# Patient Record
Sex: Male | Born: 1955
Health system: Southern US, Community
[De-identification: ages and names within clinical notes are randomized; demographics above are authoritative.]

## PROBLEM LIST (undated history)

## (undated) DIAGNOSIS — Z8709 Personal history of other diseases of the respiratory system: Secondary | ICD-10-CM

## (undated) DIAGNOSIS — G709 Myoneural disorder, unspecified: Secondary | ICD-10-CM

## (undated) DIAGNOSIS — M1990 Unspecified osteoarthritis, unspecified site: Secondary | ICD-10-CM

## (undated) DIAGNOSIS — E1165 Type 2 diabetes mellitus with hyperglycemia: Secondary | ICD-10-CM

## (undated) DIAGNOSIS — Z789 Other specified health status: Secondary | ICD-10-CM

## (undated) DIAGNOSIS — E1069 Type 1 diabetes mellitus with other specified complication: Secondary | ICD-10-CM

## (undated) DIAGNOSIS — N200 Calculus of kidney: Secondary | ICD-10-CM

## (undated) DIAGNOSIS — J45909 Unspecified asthma, uncomplicated: Secondary | ICD-10-CM

## (undated) DIAGNOSIS — I209 Angina pectoris, unspecified: Secondary | ICD-10-CM

## (undated) DIAGNOSIS — E7800 Pure hypercholesterolemia, unspecified: Secondary | ICD-10-CM

## (undated) DIAGNOSIS — I2510 Atherosclerotic heart disease of native coronary artery without angina pectoris: Secondary | ICD-10-CM

## (undated) DIAGNOSIS — C61 Malignant neoplasm of prostate: Secondary | ICD-10-CM

## (undated) DIAGNOSIS — Z87442 Personal history of urinary calculi: Secondary | ICD-10-CM

## (undated) DIAGNOSIS — Z794 Long term (current) use of insulin: Secondary | ICD-10-CM

## (undated) DIAGNOSIS — E119 Type 2 diabetes mellitus without complications: Secondary | ICD-10-CM

## (undated) HISTORY — DX: Pure hypercholesterolemia, unspecified: E78.00

## (undated) HISTORY — DX: Atherosclerotic heart disease of native coronary artery without angina pectoris: I25.10

## (undated) HISTORY — PX: CATARACT EXTRACTION W/ INTRAOCULAR LENS  IMPLANT, BILATERAL: SHX1307

## (undated) HISTORY — PX: EYE SURGERY: SHX253

## (undated) HISTORY — DX: Type 1 diabetes mellitus with other specified complication: E10.69

## (undated) HISTORY — PX: THYROIDECTOMY, PARTIAL: SHX18

## (undated) HISTORY — DX: Other specified health status: Z78.9

## (undated) HISTORY — DX: Type 2 diabetes mellitus with hyperglycemia: E11.65

---
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
## 1985-06-09 HISTORY — PX: INGUINAL HERNIA REPAIR: SUR1180

## 2008-02-25 ENCOUNTER — Emergency Department (HOSPITAL_COMMUNITY): Admission: EM | Admit: 2008-02-25 | Discharge: 2008-02-25 | Payer: Self-pay | Admitting: Emergency Medicine

## 2008-03-15 ENCOUNTER — Encounter: Admission: RE | Admit: 2008-03-15 | Discharge: 2008-03-15 | Payer: Self-pay | Admitting: Family Medicine

## 2008-06-09 DIAGNOSIS — C61 Malignant neoplasm of prostate: Secondary | ICD-10-CM

## 2008-06-09 HISTORY — DX: Malignant neoplasm of prostate: C61

## 2008-06-09 HISTORY — PX: RETINAL DETACHMENT SURGERY: SHX105

## 2008-06-09 HISTORY — PX: INSERTION PROSTATE RADIATION SEED: SUR718

## 2008-06-09 HISTORY — PX: RETINAL LASER PROCEDURE: SHX2339

## 2008-07-18 ENCOUNTER — Ambulatory Visit: Admission: RE | Admit: 2008-07-18 | Discharge: 2008-10-16 | Payer: Self-pay | Admitting: Radiation Oncology

## 2008-08-11 ENCOUNTER — Encounter: Admission: RE | Admit: 2008-08-11 | Discharge: 2008-08-11 | Payer: Self-pay | Admitting: Urology

## 2008-09-13 ENCOUNTER — Ambulatory Visit (HOSPITAL_BASED_OUTPATIENT_CLINIC_OR_DEPARTMENT_OTHER): Admission: RE | Admit: 2008-09-13 | Discharge: 2008-09-13 | Payer: Self-pay | Admitting: Urology

## 2008-11-07 ENCOUNTER — Ambulatory Visit: Admission: RE | Admit: 2008-11-07 | Discharge: 2008-12-01 | Payer: Self-pay | Admitting: Radiation Oncology

## 2010-06-09 HISTORY — PX: CORONARY ANGIOPLASTY WITH STENT PLACEMENT: SHX49

## 2010-09-18 LAB — GLUCOSE, CAPILLARY: Glucose-Capillary: 69 mg/dL — ABNORMAL LOW (ref 70–99)

## 2010-09-19 LAB — APTT: aPTT: 31 seconds (ref 24–37)

## 2010-09-19 LAB — PROTIME-INR
INR: 1 (ref 0.00–1.49)
Prothrombin Time: 12.8 seconds (ref 11.6–15.2)

## 2010-09-19 LAB — COMPREHENSIVE METABOLIC PANEL
Alkaline Phosphatase: 101 U/L (ref 39–117)
CO2: 32 mEq/L (ref 19–32)
Calcium: 9.4 mg/dL (ref 8.4–10.5)
Glucose, Bld: 198 mg/dL — ABNORMAL HIGH (ref 70–99)
Sodium: 139 mEq/L (ref 135–145)
Total Protein: 6.9 g/dL (ref 6.0–8.3)

## 2010-09-19 LAB — CBC
MCHC: 34.2 g/dL (ref 30.0–36.0)
MCV: 92.9 fL (ref 78.0–100.0)
RBC: 4.84 MIL/uL (ref 4.22–5.81)
WBC: 5.3 10*3/uL (ref 4.0–10.5)

## 2010-10-22 NOTE — Op Note (Signed)
NAMEJALIN, Travis Fletcher           ACCOUNT NO.:  [id number]   MEDICAL RECORD NO.:  [id number]          PATIENT TYPE:  AMB   LOCATION:  [facility]                         FACILITY:  [facility]   PHYSICIAN:  Heloise Purpura, MD      DATE OF BIRTH:  01-08-1956   DATE OF PROCEDURE:  09/13/2008  DATE OF DISCHARGE:                               OPERATIVE REPORT   PREOPERATIVE DIAGNOSIS:  Prostate cancer.   POSTOPERATIVE DIAGNOSIS:  Prostate cancer.   PROCEDURE:  1. Transperineal placement of radiation seeds into prostate.  2. Flexible cystoscopy.   SURGEON:  Heloise Purpura, MD.   RADIATION ONCOLOGIST:  Artist Pais. Kathrynn Running, MD.   ANESTHESIA:  General.   COMPLICATIONS:  None.   ESTIMATED BLOOD LOSS:  Minimal.   INDICATIONS:  Mr. Plessinger is a 55 year old gentleman with clinically  localized adenocarcinoma of the prostate.  After a thorough discussion  regarding management options for treatment, he elected to proceed with  the above procedure and treatment for his prostate cancer.  The  potential risks, complications and alternative treatment options were  discussed in detail and informed consent was obtained.   DESCRIPTION OF PROCEDURE:  The patient was taken to the operating room  and a general anesthetic was administered.  He was given preoperative  antibiotics, placed in the dorsal lithotomy position and prepped and  draped in the usual sterile fashion.  Next a preoperative time-out was  performed.  Dr. Kathrynn Running then proceeded with intraoperative planning for  placement of the radiation seeds into the prostate.  Once intraoperative  planning was complete, utilizing a perineal template a total of 27  needles were used to place 84 seeds into the prostate per the devised  plan.  This was performed without problems or complications.  The  treatment dose was 145 gray.  Once all seeds were appropriately placed  into the prostate, flexible cystourethroscopy was performed which  demonstrated a  normal anterior and posterior urethra.  Inspection of the  bladder revealed no tumors, stones or other mucosal pathology.  The  ureteral orifices were identified in the normal anatomic position and  effluxing clear urine.  No radiation seeds were noted within the  prostatic urethra or bladder.  A Foley catheter was then placed into the  bladder  and the procedure was ended.  He tolerated the procedure well and  without complications.  He was able to be awakened and transferred to  the recovery unit in satisfactory condition.  Of note, the seeds were  placed with the aid of the robotic Nucletron.      Heloise Purpura, MD  Electronically Signed     LB/MEDQ  D:  09/13/2008  T:  09/13/2008  Job:  [id number]

## 2011-01-04 ENCOUNTER — Inpatient Hospital Stay (HOSPITAL_COMMUNITY)
Admission: EM | Admit: 2011-01-04 | Discharge: 2011-01-07 | DRG: 852 | Disposition: A | Discharge status: Home / Self Care | Payer: BC Managed Care – PPO | Attending: Cardiology | Admitting: Cardiology

## 2011-01-04 ENCOUNTER — Emergency Department (HOSPITAL_COMMUNITY): Payer: BC Managed Care – PPO

## 2011-01-04 DIAGNOSIS — IMO0002 Reserved for concepts with insufficient information to code with codable children: Secondary | ICD-10-CM | POA: Diagnosis present

## 2011-01-04 DIAGNOSIS — I10 Essential (primary) hypertension: Secondary | ICD-10-CM | POA: Diagnosis present

## 2011-01-04 DIAGNOSIS — R1013 Epigastric pain: Secondary | ICD-10-CM | POA: Diagnosis present

## 2011-01-04 DIAGNOSIS — Z9641 Presence of insulin pump (external) (internal): Secondary | ICD-10-CM

## 2011-01-04 DIAGNOSIS — Z794 Long term (current) use of insulin: Secondary | ICD-10-CM

## 2011-01-04 DIAGNOSIS — Z79899 Other long term (current) drug therapy: Secondary | ICD-10-CM

## 2011-01-04 DIAGNOSIS — E7800 Pure hypercholesterolemia, unspecified: Secondary | ICD-10-CM | POA: Diagnosis present

## 2011-01-04 DIAGNOSIS — E1065 Type 1 diabetes mellitus with hyperglycemia: Secondary | ICD-10-CM | POA: Diagnosis present

## 2011-01-04 DIAGNOSIS — I2510 Atherosclerotic heart disease of native coronary artery without angina pectoris: Principal | ICD-10-CM | POA: Diagnosis present

## 2011-01-04 DIAGNOSIS — I200 Unstable angina: Secondary | ICD-10-CM | POA: Diagnosis present

## 2011-01-04 DIAGNOSIS — K219 Gastro-esophageal reflux disease without esophagitis: Secondary | ICD-10-CM | POA: Diagnosis present

## 2011-01-04 DIAGNOSIS — Z7982 Long term (current) use of aspirin: Secondary | ICD-10-CM

## 2011-01-04 LAB — CBC
HCT: 40.2 % (ref 39.0–52.0)
MCH: 30.9 pg (ref 26.0–34.0)
MCHC: 35.3 g/dL (ref 30.0–36.0)
Platelets: 208 10*3/uL (ref 150–400)
RBC: 4.59 MIL/uL (ref 4.22–5.81)
RDW: 11.8 % (ref 11.5–15.5)
WBC: 5.3 10*3/uL (ref 4.0–10.5)

## 2011-01-04 LAB — COMPREHENSIVE METABOLIC PANEL
ALT: 20 U/L (ref 0–53)
Albumin: 3.6 g/dL (ref 3.5–5.2)
Alkaline Phosphatase: 101 U/L (ref 39–117)
BUN: 16 mg/dL (ref 6–23)
Calcium: 9.5 mg/dL (ref 8.4–10.5)
Creatinine, Ser: 0.8 mg/dL (ref 0.50–1.35)
GFR calc Af Amer: >60 mL/min (ref >60)
Sodium: 135 mEq/L (ref 135–145)
Total Bilirubin: 0.7 mg/dL (ref 0.3–1.2)
Total Protein: 7 g/dL (ref 6.0–8.3)

## 2011-01-04 LAB — TROPONIN I: Troponin I: <0.3 ng/mL (ref <0.30)

## 2011-01-04 LAB — PROTIME-INR: INR: 1.02 (ref 0.00–1.49)

## 2011-01-04 LAB — HEMOGLOBIN A1C
Hgb A1c MFr Bld: 8 % — ABNORMAL HIGH (ref <5.7)
Mean Plasma Glucose: 183 mg/dL — ABNORMAL HIGH (ref <117)

## 2011-01-04 LAB — C-REACTIVE PROTEIN: CRP: 0.2 mg/dL — ABNORMAL LOW (ref <0.6)

## 2011-01-04 LAB — DIFFERENTIAL
Basophils Absolute: 0 10*3/uL (ref 0.0–0.1)
Eosinophils Absolute: 0.1 10*3/uL (ref 0.0–0.7)
Lymphocytes Relative: 30 % (ref 12–46)
Lymphs Abs: 1.6 10*3/uL (ref 0.7–4.0)
Monocytes Relative: 11 % (ref 3–12)

## 2011-01-04 LAB — BASIC METABOLIC PANEL
Chloride: 103 mEq/L (ref 96–112)
Creatinine, Ser: 0.78 mg/dL (ref 0.50–1.35)
GFR calc non Af Amer: >60 mL/min (ref >60)
Glucose, Bld: 129 mg/dL — ABNORMAL HIGH (ref 70–99)

## 2011-01-04 LAB — CK TOTAL AND CKMB (NOT AT ARMC): CK, MB: 2.1 ng/mL (ref 0.3–4.0)

## 2011-01-04 LAB — HEPARIN LEVEL (UNFRACTIONATED): Heparin Unfractionated: 0.45 IU/mL (ref 0.30–0.70)

## 2011-01-04 LAB — TSH: TSH: 1.593 u[IU]/mL (ref 0.350–4.500)

## 2011-01-04 LAB — GLUCOSE, CAPILLARY: Glucose-Capillary: 325 mg/dL — ABNORMAL HIGH (ref 70–99)

## 2011-01-05 LAB — CBC
Hemoglobin: 14.3 g/dL (ref 13.0–17.0)
MCH: 31 pg (ref 26.0–34.0)
RBC: 4.62 MIL/uL (ref 4.22–5.81)

## 2011-01-05 LAB — TROPONIN I: Troponin I: <0.3 ng/mL (ref <0.30)

## 2011-01-05 LAB — CK TOTAL AND CKMB (NOT AT ARMC)
CK, MB: 1.9 ng/mL (ref 0.3–4.0)
Relative Index: INVALID (ref 0.0–2.5)
Total CK: 50 U/L (ref 7–232)

## 2011-01-05 LAB — LIPID PANEL: Cholesterol: 161 mg/dL (ref 0–200)

## 2011-01-06 ENCOUNTER — Ambulatory Visit (HOSPITAL_COMMUNITY): Admission: RE | Admit: 2011-01-06 | Payer: BC Managed Care – PPO | Source: Ambulatory Visit | Admitting: Cardiology

## 2011-01-06 LAB — GLUCOSE, CAPILLARY
Glucose-Capillary: 214 mg/dL — ABNORMAL HIGH (ref 70–99)
Glucose-Capillary: 219 mg/dL — ABNORMAL HIGH (ref 70–99)
Glucose-Capillary: 182 mg/dL — ABNORMAL HIGH (ref 70–99)
Glucose-Capillary: 296 mg/dL — ABNORMAL HIGH (ref 70–99)
Glucose-Capillary: 203 mg/dL — ABNORMAL HIGH (ref 70–99)
Glucose-Capillary: 130 mg/dL — ABNORMAL HIGH (ref 70–99)

## 2011-01-06 LAB — CBC
Platelets: 191 10*3/uL (ref 150–400)
RBC: 4.64 MIL/uL (ref 4.22–5.81)
WBC: 6.8 10*3/uL (ref 4.0–10.5)

## 2011-01-06 LAB — CK TOTAL AND CKMB (NOT AT ARMC)
CK, MB: 2 ng/mL (ref 0.3–4.0)
Total CK: 50 U/L (ref 7–232)

## 2011-01-06 LAB — TROPONIN I
Troponin I: <0.3 ng/mL (ref <0.30)
Troponin I: <0.3 ng/mL (ref <0.30)

## 2011-01-07 LAB — GLUCOSE, CAPILLARY: Glucose-Capillary: 151 mg/dL — ABNORMAL HIGH (ref 70–99)

## 2011-01-07 LAB — BASIC METABOLIC PANEL
BUN: 11 mg/dL (ref 6–23)
CO2: 29 mEq/L (ref 19–32)
Calcium: 9.3 mg/dL (ref 8.4–10.5)
Creatinine, Ser: 0.79 mg/dL (ref 0.50–1.35)

## 2011-01-07 LAB — CBC
HCT: 40.3 % (ref 39.0–52.0)
MCH: 31.7 pg (ref 26.0–34.0)
MCV: 88.8 fL (ref 78.0–100.0)
Platelets: 196 10*3/uL (ref 150–400)
RBC: 4.54 MIL/uL (ref 4.22–5.81)

## 2011-01-07 LAB — TROPONIN I: Troponin I: <0.3 ng/mL (ref <0.30)

## 2011-01-22 NOTE — Cardiovascular Report (Signed)
Travis Fletcher, Travis Fletcher           ACCOUNT NO.:  [id number]  MEDICAL RECORD NO.:  [id number]  LOCATION:  2807                         FACILITY:  [facility]  PHYSICIAN:  Pamella Pert, MD DATE OF BIRTH:  [birth year]  DATE OF PROCEDURE:  01/06/2011 DATE OF DISCHARGE:                           CARDIAC CATHETERIZATION   PROCEDURE PERFORMED: 1. Left ventriculography. 2. Selective right and left coronary arteriography. 3. PTCA and stenting of the mid circumflex coronary artery. 4. PTCA and balloon angioplasty of the ramus intermediate branch.  INDICATION:  Travis Fletcher is a 55 year old gentleman with history of long-standing type 1 brittle diabetes uncontrolled, hyperlipidemia, and not wanted to be on statin therapy, who has been complaining of chest discomfort.  He had undergone a stress test in the office and this test revealed EKG changes with chest discomfort. Therefore  he was brought to the cardiac catheterization lab to evaluate his coronary anatomy.  However, he already was scheduled for elective angiography on Monday, he presented to [facility] Emergency Department complaining of  chest discomfort .  He was seen by my colleague Dr. Edwyna Shell and admitted for unstable angina and ruled out.  HEMODYNAMIC DATA:  The left ventricular pressure was 100/5 with end- diastolic pressure of 18 mmHg.  Aortic pressure was 96/62 with a mean of 77 mmHg.  There was no significant pressure gradient across the aortic valve.  ANGIOGRAPHIC DATA:  Left ventricle.  Left ventricular systolic function was supranormal with ejection fraction of 70%.  There is no significant mitral regurgitation.  Right coronary artery.  Right coronary artery is codominant with circumflex coronary artery.  It has got mild luminal irregularity of about  20-30% stenosis.  Left main coronary artery.  Left main coronary artery is a large-caliber vessel.  It splits, smooth and normal.  Circumflex coronary  artery:  Circumflex coronary ostium shows a 20% stenosis.  The circumflex is codominant with right coronary artery.  The circumflex  shows a 70 % stenosis in the mid segment and mid distal segment shows a 40% stenosis.  Ramus intermediate:  Ramus intermediate is a very small caliber vessel. Measuring at small 1.7 x 2 mm vessel.  It has the ostia with 70% smooth stenosis followed by 95% stenosis.  LAD:  LAD is a large caliber vessel in the proximal segment.  There is moderate amount of atherosclerotic burden constituting 50% stenosis in the mid segment of the LAD.  Mid LAD give origin to smaller diagonals.  Discussion: Although the Circumflex stenosis did not appear to be high grade, the stress test was markedly abnormal and cannot be explained by  small ramus branch stenosis. Also presenting with unstable angina. Hence  will proceed with PCI.  INTERVENTION DATA:  Successful PTCA and stenting of the mid circumflex coronary artery with implantation of a 4.0 x  15 mm Integrity stent which was deployed at 10 atmospheric pressures for 40 seconds followed by  angiography.  The stenosis reduced to 80% to 0% with brisk TIMI 3 to TIMI 2 flow. PTCA and balloon angioplasty of the ramus intermediate branches and circumflex coronary artery with a 2.0 x 15 mm sphincter balloon. However, brisk  TIMI 3 flow was evident.Small vessel. Type A  dissection left alone RECOMMENDATIONS: 1. The patient needs aggressive risk modification.  His diabetes is     uncontrolled, not on a statin therapy per his choice.  He is now placed     on appropriate medical therapy which will be continued at this time TECHNIQUE OF THE PROCEDURE:  Under sterile preparation, using the right radial, a 6 [place]  TIG 4 catheter was advanced through the ascending aorta then into the left ventriculography was performed in the RAO projection.  Catheter pulled into the ascending aorta and left main and left coronary angiography was  performed.  The catheter was pulled out of  the body were exchanged over a J  wire.  TECHNIQUE OF INTERVENTION:  Using Angiomax  anticoagulation, a Ikari 3.5 guide catheter to 6 [place] guide catheter was utilized into the left main coronary artery. A medtronic intuition guide wire was used to cross  Circumflex coronary artery. We predilated this with a 3.0 x 15 mm Trek balloon followed by stenting with bare metal Integrity 4x22mm  stent at 10 atmospheric pressure for 60 seconds followed by  angiography.   Excellent results were evident.  Attention was directed to the ramus intermediate branch. Same guide wire was used  to cross the lesion. Then a 2-0 x 12 mm sphincter balloon was advanced to the lesion for 22 and 45 seconds.  There was type A dissection evident with a pullback of a wire, however, there was TIMI 3 flow. Hence lesion left alone. Overall, the patient tolerated the procedure well.  The patient had left shoulder pain throughout the procedure from the beginning even before the  diagnostic angiography and at the end of the procedure.  He did not have chest pain  during this procedure.  No EKG changes are evident postprocedure.  Overall doing well,  the patient will be followed closely inpatient setting with follow up serials.  He potentially will be discharged home in the morning unless the cardiac markers are positive or recurrent chest pain   Pamella Pert, MD     JRG/MEDQ  D:  01/06/2011  T:  01/06/2011  Job:  [id number]  Electronically Signed by Yates Decamp MD on 01/22/2011 06:28:57 PM

## 2011-01-22 NOTE — Discharge Summary (Signed)
NAMECAESON, FILIPPI           ACCOUNT NO.:  [id number]  MEDICAL RECORD NO.:  [id number]  LOCATION:  6529                         FACILITY:  [facility]  PHYSICIAN:  Pamella Pert, MD DATE OF BIRTH:  1955/12/25  DATE OF ADMISSION:  01/04/2011 DATE OF DISCHARGE:  01/07/2011                              DISCHARGE SUMMARY   DISCHARGE DIAGNOSES: 1. Unstable angina. 2. Coronary artery disease, status post PTCA and stenting of the mid     circumflex coronary artery with implantation of a nondrug-eluting     4.0 x 15 mm integrity stent with reduction of stenosis from 70% to     0% with press TIMI 2 to TIMI 3 flow. 3. Successful percutaneous transluminal coronary angioplasty and     balloon angioplasty only of a small ramus intermedius branch with     reduction of stenosis from 95% to less than 20% with type A     dissection with TIMI 3 flow. 4. Diabetes mellitus, uncontrolled. 5. Hypertension. 6. Hyperlipidemia.  RECOMMENDATIONS:  The patient will be discharged home today.  His discharge medications will include; 1. Losartan 25 mg half tablet p.o. daily. 2. Coreg 3.125 mg p.o. b.i.d. 3. Crestor 10 mg p.o. daily. 4. Lispro insulin pump. 5. Nitroglycerin 0.4 mg sublingual p.r.n. 6. Omeprazole 40 mg p.o. daily. 7. Olive leaf OTC 1 p.o. b.i.d. 8. Aspirin 81 mg p.o. daily. 9. Prasugrel 10 mg p.o. daily.  BRIEF HISTORY:  Mr. Travis Fletcher is a 55 year old gentleman, who had seen in the office on January 01, 2011.  Because of chest discomfort that had come on the 2 weeks prior to the evaluation he underwent a treadmill exercise stress testing which he developed significant ST- segment changes with low exercise.  This EKG change of 2.5 mm in the inferior and lateral leads persisted for greater than 5 minutes, in fact greater than 7 minutes, and still never came back to baseline.  He also had developed chest discomfort.  Because of his multiple cardiovascular risk factors, he was  scheduled for elective angiogram on January 05, 2011.  However, the patient presented to the hospital with chest pain on and off suggestive of unstable angina and was admitted by my colleague Dr. Sharyn Lull.  It was ruled out for myocardial infarction and he as usual further scheduled he was on Monday morning brought to the cardiac cath lab to evaluate his coronary anatomy.  He went cardiac catheterization and this revealed mild disease in the right coronary artery and moderate disease in the proximal LAD and distal LAD.  However, the circumflex artery had a hazy what appeared to be between about 60-70% stenosis, however, because of his haziness I suspected that stenosis probably in the range of 70-80%.  He also had a ramus intermediate which was very small 95% stenosis in the proximal segment and ostial 70% stenosis.  Because of this significant EKG changes that he had in the office and also that her persistent in the inferior and lateral leads, I did not feel that the ramus intermediate for such amount of EKG changes and also the chest discomfort.  Hence I felt that the circumflex coronary artery was clinically significant and I decided to proceed  with stenting which I implanted a nondrug-eluting 4.0 x 15 mm integrity stent with excellent results.  And at the same time I felt that the ramus intermedius lesion which was high-grade would benefit from possible angioplasty as it was near occlusive.  In fact with the balloon passages, the balloon without any inflation there was no flow into the ramus intermediate testing high- grade lesion.  I gently ballooned it only at 4 atmospheres pressure with 2-mm balloon, there was a type A dissection.  However, there was TIMI 3 flow.  Hence I left the lesion alone.  I followed the cardiac markers over the next 12-18 hours in the hospital and the patient remained hemodynamically stable without any chest pain or without any leak in his cardiac markers.   Hence I felt he was stable enough for discharge with continued outpatient aggressive risk management.  I will see him back to the office in a couple of weeks.     Pamella Pert, MD     JRG/MEDQ  D:  01/07/2011  T:  01/07/2011  Job:  [id number]  cc:   Dorisann Frames, M.D.  Electronically Signed by Yates Decamp MD on 01/22/2011 06:21:08 PM

## 2011-03-10 LAB — URINALYSIS, ROUTINE W REFLEX MICROSCOPIC
Glucose, UA: >1000 — AB
Hgb urine dipstick: NEGATIVE
Ketones, ur: NEGATIVE
Protein, ur: NEGATIVE

## 2011-03-10 LAB — DIFFERENTIAL
Basophils Absolute: 0
Basophils Relative: 0
Monocytes Relative: 6
Neutro Abs: 5.9
Neutrophils Relative %: 74

## 2011-03-10 LAB — POCT I-STAT, CHEM 8
Chloride: 103
Glucose, Bld: 278 — ABNORMAL HIGH
HCT: 43
Potassium: 4.6
Sodium: 136

## 2011-03-10 LAB — GLUCOSE, CAPILLARY
Glucose-Capillary: 133 — ABNORMAL HIGH
Glucose-Capillary: 294 — ABNORMAL HIGH

## 2011-03-10 LAB — URINE MICROSCOPIC-ADD ON

## 2011-03-10 LAB — URINE CULTURE
Colony Count: NO GROWTH
Culture: NO GROWTH

## 2011-03-10 LAB — CBC
MCHC: 34
RBC: 4.64

## 2012-07-12 ENCOUNTER — Encounter (HOSPITAL_COMMUNITY): Payer: Self-pay | Admitting: Pharmacy Technician

## 2012-07-13 ENCOUNTER — Ambulatory Visit (HOSPITAL_COMMUNITY)
Admission: RE | Admit: 2012-07-13 | Discharge: 2012-07-14 | Disposition: A | Discharge status: Home / Self Care | Payer: BC Managed Care – PPO | Source: Ambulatory Visit | Attending: Cardiology | Admitting: Cardiology

## 2012-07-13 ENCOUNTER — Encounter (HOSPITAL_COMMUNITY)
Admission: RE | Disposition: A | Discharge status: Home / Self Care | Payer: Self-pay | Source: Ambulatory Visit | Attending: Cardiology

## 2012-07-13 ENCOUNTER — Encounter (HOSPITAL_COMMUNITY): Payer: Self-pay | Admitting: General Practice

## 2012-07-13 DIAGNOSIS — I2510 Atherosclerotic heart disease of native coronary artery without angina pectoris: Secondary | ICD-10-CM | POA: Insufficient documentation

## 2012-07-13 DIAGNOSIS — E1065 Type 1 diabetes mellitus with hyperglycemia: Secondary | ICD-10-CM | POA: Insufficient documentation

## 2012-07-13 DIAGNOSIS — IMO0002 Reserved for concepts with insufficient information to code with codable children: Secondary | ICD-10-CM | POA: Insufficient documentation

## 2012-07-13 DIAGNOSIS — Z9861 Coronary angioplasty status: Secondary | ICD-10-CM | POA: Insufficient documentation

## 2012-07-13 DIAGNOSIS — E785 Hyperlipidemia, unspecified: Secondary | ICD-10-CM | POA: Insufficient documentation

## 2012-07-13 DIAGNOSIS — I10 Essential (primary) hypertension: Secondary | ICD-10-CM | POA: Insufficient documentation

## 2012-07-13 HISTORY — DX: Atherosclerotic heart disease of native coronary artery without angina pectoris: I25.10

## 2012-07-13 HISTORY — DX: Unspecified osteoarthritis, unspecified site: M19.90

## 2012-07-13 HISTORY — PX: LEFT HEART CATHETERIZATION WITH CORONARY ANGIOGRAM: SHX5451

## 2012-07-13 HISTORY — DX: Angina pectoris, unspecified: I20.9

## 2012-07-13 HISTORY — DX: Myoneural disorder, unspecified: G70.9

## 2012-07-13 HISTORY — PX: BALLOON ANGIOPLASTY, ARTERY: SHX564

## 2012-07-13 LAB — BASIC METABOLIC PANEL
BUN: 14 mg/dL (ref 6–23)
CO2: 27 mEq/L (ref 19–32)
Chloride: 101 mEq/L (ref 96–112)
GFR calc non Af Amer: >90 mL/min (ref >90)
Glucose, Bld: 198 mg/dL — ABNORMAL HIGH (ref 70–99)
Potassium: 4.2 mEq/L (ref 3.5–5.1)
Sodium: 135 mEq/L (ref 135–145)

## 2012-07-13 LAB — GLUCOSE, CAPILLARY: Glucose-Capillary: 206 mg/dL — ABNORMAL HIGH (ref 70–99)

## 2012-07-13 LAB — CBC
HCT: 42.7 % (ref 39.0–52.0)
MCV: 89.5 fL (ref 78.0–100.0)
RBC: 4.77 MIL/uL (ref 4.22–5.81)
RDW: 11.6 % (ref 11.5–15.5)
WBC: 5.8 10*3/uL (ref 4.0–10.5)

## 2012-07-13 LAB — PROTIME-INR: INR: 0.98 (ref 0.00–1.49)

## 2012-07-13 SURGERY — LEFT HEART CATHETERIZATION WITH CORONARY ANGIOGRAM
Anesthesia: LOCAL

## 2012-07-13 MED ORDER — FENTANYL CITRATE 0.05 MG/ML IJ SOLN
INTRAMUSCULAR | Status: AC
  Filled 2012-07-13: qty 2

## 2012-07-13 MED ORDER — HEPARIN (PORCINE) IN NACL 2-0.9 UNIT/ML-% IJ SOLN
INTRAMUSCULAR | Status: AC
  Filled 2012-07-13: qty 1000

## 2012-07-13 MED ORDER — SODIUM CHLORIDE 0.9 % IV SOLN
INTRAVENOUS | Status: AC
  Administered 2012-07-13: 12:00:00 via INTRAVENOUS

## 2012-07-13 MED ORDER — VERAPAMIL HCL 2.5 MG/ML IV SOLN
INTRAVENOUS | Status: AC
  Filled 2012-07-13: qty 2

## 2012-07-13 MED ORDER — MIDAZOLAM HCL 2 MG/2ML IJ SOLN
INTRAMUSCULAR | Status: AC
Start: 2012-07-13 — End: 2012-07-13
  Filled 2012-07-13: qty 2

## 2012-07-13 MED ORDER — INSULIN ASPART 100 UNIT/ML ~~LOC~~ SOLN
0.0000 [IU] | Freq: Three times a day (TID) | SUBCUTANEOUS | Status: DC
  Administered 2012-07-13: 7 [IU] via SUBCUTANEOUS
  Administered 2012-07-13: 12:00:00 5 [IU] via SUBCUTANEOUS
  Administered 2012-07-14: 08:00:00 4.5 [IU] via SUBCUTANEOUS

## 2012-07-13 MED ORDER — LOSARTAN POTASSIUM 25 MG PO TABS
12.5000 mg | ORAL_TABLET | Freq: Every day | ORAL | Status: DC
  Administered 2012-07-14: 12.5 mg via ORAL
  Filled 2012-07-13: qty 0.5

## 2012-07-13 MED ORDER — NITROGLYCERIN 1 MG/10 ML FOR IR/CATH LAB
INTRA_ARTERIAL | Status: AC
  Filled 2012-07-13: qty 10

## 2012-07-13 MED ORDER — HYDROMORPHONE HCL PF 2 MG/ML IJ SOLN
INTRAMUSCULAR | Status: AC
  Filled 2012-07-13: qty 1

## 2012-07-13 MED ORDER — SODIUM CHLORIDE 0.9 % IJ SOLN: 3.0000 mL | Freq: Two times a day (BID) | INTRAMUSCULAR | Status: DC

## 2012-07-13 MED ORDER — HEPARIN SODIUM (PORCINE) 1000 UNIT/ML IJ SOLN
INTRAMUSCULAR | Status: AC
  Filled 2012-07-13: qty 1

## 2012-07-13 MED ORDER — ATORVASTATIN CALCIUM 10 MG PO TABS
10.0000 mg | ORAL_TABLET | Freq: Every day | ORAL | Status: DC
  Filled 2012-07-13 (×2): qty 1

## 2012-07-13 MED ORDER — METOPROLOL TARTRATE 12.5 MG HALF TABLET
12.5000 mg | ORAL_TABLET | Freq: Two times a day (BID) | ORAL | Status: DC
  Administered 2012-07-13 – 2012-07-14 (×2): 12.5 mg via ORAL
  Filled 2012-07-13 (×4): qty 1

## 2012-07-13 MED ORDER — ASPIRIN 81 MG PO CHEW
324.0000 mg | CHEWABLE_TABLET | ORAL | Status: AC
  Administered 2012-07-13: 81 mg via ORAL

## 2012-07-13 MED ORDER — ACETAMINOPHEN 325 MG PO TABS: 650.0000 mg | ORAL_TABLET | ORAL | Status: DC | PRN

## 2012-07-13 MED ORDER — ONDANSETRON HCL 4 MG/2ML IJ SOLN: 4.0000 mg | Freq: Four times a day (QID) | INTRAMUSCULAR | Status: DC | PRN

## 2012-07-13 MED ORDER — LIDOCAINE HCL (PF) 1 % IJ SOLN
INTRAMUSCULAR | Status: AC
  Filled 2012-07-13: qty 30

## 2012-07-13 MED ORDER — SODIUM CHLORIDE 0.9 % IV SOLN: 250.0000 mL | INTRAVENOUS | Status: DC | PRN

## 2012-07-13 MED ORDER — BIVALIRUDIN 250 MG IV SOLR
INTRAVENOUS | Status: AC
  Filled 2012-07-13: qty 250

## 2012-07-13 MED ORDER — TICAGRELOR 90 MG PO TABS
ORAL_TABLET | ORAL | Status: AC
  Filled 2012-07-13: qty 2

## 2012-07-13 MED ORDER — SODIUM CHLORIDE 0.9 % IJ SOLN: 3.0000 mL | INTRAMUSCULAR | Status: DC | PRN

## 2012-07-13 MED ORDER — INSULIN ASPART 100 UNIT/ML ~~LOC~~ SOLN: 3.0000 [IU] | Freq: Three times a day (TID) | SUBCUTANEOUS | Status: DC

## 2012-07-13 MED ORDER — ASPIRIN 81 MG PO CHEW
81.0000 mg | CHEWABLE_TABLET | Freq: Every day | ORAL | Status: DC
  Administered 2012-07-14: 10:00:00 81 mg via ORAL
  Filled 2012-07-13: qty 1

## 2012-07-13 MED ORDER — INSULIN ASPART 100 UNIT/ML ~~LOC~~ SOLN: 0.0000 [IU] | Freq: Every day | SUBCUTANEOUS | Status: DC

## 2012-07-13 MED ORDER — ASPIRIN 81 MG PO CHEW
CHEWABLE_TABLET | ORAL | Status: AC
  Administered 2012-07-13: 81 mg via ORAL
  Filled 2012-07-13: qty 1

## 2012-07-13 MED ORDER — TICAGRELOR 90 MG PO TABS
90.0000 mg | ORAL_TABLET | Freq: Two times a day (BID) | ORAL | Status: DC
  Administered 2012-07-13 – 2012-07-14 (×2): 90 mg via ORAL
  Filled 2012-07-13 (×3): qty 1

## 2012-07-13 MED ORDER — SODIUM CHLORIDE 0.9 % IV SOLN
INTRAVENOUS | Status: DC
  Administered 2012-07-13: 09:00:00 via INTRAVENOUS

## 2012-07-13 NOTE — CV Procedure (Signed)
Procedure performed:  Left heart catheterization including hemodynamic monitoring of the left ventricle, LV gram. Selective right and left coronary arteriography. PTCA and cutting balloon angioplasty of the PDA branch of RCA with 2.25x78mm Cutting balloon.  Indication: Patient is a 57 year-old male with history of CAD with stenting of proximal and  mid circumflex  coronary artery with implantation of a 4.0 x 15 mm Integrity stent on 01/06/11 and Ramus intermediate  Mid balloon angioplasty with 2.0x10 mm balloon at that time,  hypertension,  hyperlipidemia,  Diabetes Mellitus   who presents with recurrent chest pain suggestive of angina pectoris and on maximal medical therapy. Patient has  had non invasive testing which was normal. Due to continued chest discomfort, patient was brought to the cardiac catheterization lab to evaluate his coronary anatomy.  Hemodynamic data: Left ventricular pressure was 65/-5 with LVEDP of -1 mm mercury. Aortic pressure was 61/38 with a mean of 45 mm mercury. There was no pressure gradient across the aortic valve. Pressure improved with IV fluid bolus to 103/63/104 mean.  Left ventricle: Performed in the RAO projection revealed LVEF of 60%. There was No  MR. No wall motion abnormality.  Right coronary artery: The vessel is smooth, Dominant , mild diffuse disease in the proximal segment and gives origin to a moderate sized PDA which has a 80-90% stenosis. Distal RCA is diffusely diseased.  Interventional data: Successful PTCA and cutting balloon angioplasty of the PDA branch of the RCA with a 2.25 x 10 mm cutting balloon. Stenosis reduced from 80-90% to 0% with TIMI-3 to TIMI-3 flow maintained at the end of the procedure.  Left main coronary artery is large and normal.  Circumflex coronary artery: A large vessel giving origin to a large obtuse marginal 1. The circumflex coronary artery stent that was previously placed stent 4.0 x 15 mm integrity on 01/06/2011 is widely  patent.   LAD:  LAD gives origin to a large diagonal-1.  LAD has  mild diffuse  luminal irregularities.   Ramus intermediate: Ostium shows a smooth 50-60% stenosis. This is unchanged from prior cardiac catheterization in 2012. Midsegment balloon angioplasty site is widely patent.  Disposition: Patient has had excellent balloon antiplastic results. He'll be observed overnight and will be discharged home in the morning.   Will need Dual antiplatelet therapy with Brilinta and ASA 81 mg for at least 6 weeks since balloon antiplasty.   Technique of diagnostic cardiac catheterization:  Under sterile precautions using a 6 French right radial  arterial access, a 6 French sheath was introduced into the right radial artery. A 5 [place] Tig 4 catheter was advanced into the ascending aorta selective  right coronary artery and left coronary artery was cannulated and angiography was performed in multiple views. The catheter was pulled back Out of the body over exchange length J-wire.  Same Catheter was used to perform LV gram which was performed in LAO projection. Catheter exchanged out of the body over J-Wire. NO immediate complications noted. Hemostasis achieved with TR band. Patient tolerated the procedure well.   Technique of intervention:  Using a 6 [place] FR 4 guide catheter theRCA  coronary  was selected and cannulated. Using Angiomax for anticoagulation, I utilized a Runthrough guidewire and across the Right coronary artery without any difficulty. I placed the tip of the wire into the distal  coronary artery. Angiography was performed.   Then I utilized a  2.25 x 10 mm cutting balloon , I performed balloon angioplasty at  4-5 atmospheric  pressure x 8 for 45-60 seconds  seconds each. repeat angiography revealed excellent results with disc TIMI-3 flow without any haziness. The lesion was left alone with balloon angioplasty results. Hemostasis was obtained by applying TR band.

## 2012-07-13 NOTE — Progress Notes (Signed)
TR BAND REMOVAL  LOCATION:  right radial  DEFLATED PER PROTOCOL:  yes  TIME BAND OFF / DRESSING APPLIED:   1500   SITE UPON ARRIVAL:   Level 0  SITE AFTER BAND REMOVAL:  Level 0  REVERSE ALLEN'S TEST:    positive  CIRCULATION SENSATION AND MOVEMENT:  Within Normal Limits  yes  COMMENTS:    

## 2012-07-13 NOTE — H&P (Signed)
  Please see paper chart  

## 2012-07-13 NOTE — Interval H&P Note (Deleted)
History and Physical Interval Note:  07/13/2012 9:09 AM  Travis Fletcher  has presented today for surgery, with the diagnosis of c/p  The various methods of treatment have been discussed with the patient and family. After consideration of risks, benefits and other options for treatment, the patient has consented to  Procedure(s) (LRB) with comments: LEFT HEART CATHETERIZATION WITH CORONARY ANGIOGRAM (N/A) and possible angioplasty as a surgical intervention .  The patient's history has been reviewed, patient examined, no change in status, stable for surgery.  I have reviewed the patient's chart and labs.  Questions were answered to the patient's satisfaction.   Patient was being managed medically, but do to recurrence of chest discomfort, patient recalled or office with chest pain. Previously he has had a stress test that was all the abnormal. Given the complexity of his medical issues, I recommended proceeding with cardiac catheterization. Patient understands the risks, benefits and alternatives and is willing to proceed with cardiac catheterization. I suspect he probably will need myomectomy due to the severe subvalvular aortic stenosis.   Pamella Pert

## 2012-07-13 NOTE — Interval H&P Note (Signed)
History and Physical Interval Note:  07/13/2012 9:40 AM  Elby Showers Mccullers  has presented today for surgery, with the diagnosis of c/p  The various methods of treatment have been discussed with the patient and family. After consideration of risks, benefits and other options for treatment, the patient has consented to  Procedure(s) (LRB) with comments: LEFT HEART CATHETERIZATION WITH CORONARY ANGIOGRAM (N/A) and possible angioplasty as a surgical intervention .  The patient's history has been reviewed, patient examined, no change in status, stable for surgery.  I have reviewed the patient's chart and labs.  Questions were answered to the patient's satisfaction.   Patient has recurrent chest pain has had a negative stress test after his angioplasty. However he has ongoing cardiac risks  that includes uncontrolled diabetes. Due to his recurrent chest pain, it was felt proceeding with cardiac catheterization is the best option. He had called our office regarding recurrence of chest pain. Hence cardiac catheterization is being set up for definitive diagnosis of coronary artery disease and to evaluate for progression of coronary artery cyst.  Pamella Pert

## 2012-07-14 LAB — BASIC METABOLIC PANEL
Calcium: 8.9 mg/dL (ref 8.4–10.5)
Chloride: 106 mEq/L (ref 96–112)
Creatinine, Ser: 0.73 mg/dL (ref 0.50–1.35)
GFR calc Af Amer: >90 mL/min (ref >90)
Sodium: 140 mEq/L (ref 135–145)

## 2012-07-14 LAB — CBC
MCV: 90.6 fL (ref 78.0–100.0)
Platelets: 189 10*3/uL (ref 150–400)
RDW: 11.8 % (ref 11.5–15.5)
WBC: 7.6 10*3/uL (ref 4.0–10.5)

## 2012-07-14 MED ORDER — NITROGLYCERIN 0.4 MG SL SUBL: 0.4000 mg | SUBLINGUAL_TABLET | SUBLINGUAL | Status: DC | PRN

## 2012-07-14 MED ORDER — TICAGRELOR 90 MG PO TABS: 90.0000 mg | ORAL_TABLET | Freq: Two times a day (BID) | ORAL | Status: DC

## 2012-07-14 MED ORDER — AZITHROMYCIN 1 G PO PACK: 1.0000 | PACK | Freq: Once | ORAL | Status: DC

## 2012-07-14 MED FILL — Dextrose Inj 5%: INTRAVENOUS | Qty: 50 | Status: AC

## 2012-07-14 NOTE — Progress Notes (Signed)
CARDIAC REHAB PHASE I   PRE:  Rate/Rhythm: 84 SR    BP: sitting 106/54    SaO2:   MODE:  Ambulation: 800 ft   POST:  Rate/Rhythm: 100 St    BP: sitting 126/73     SaO2:   Tolerated well. No c/o. Sts he follows heart healthy diet and is active. Thinking about CRPII. Will send referral to [facility].  [phone]  Harriet Masson CES, ACSM

## 2012-07-14 NOTE — Progress Notes (Signed)
Retro Utilization Review Completed Zoila Ditullio J. Lucretia Roers, RN, BSN, [employer] [phone]

## 2012-07-14 NOTE — Discharge Summary (Signed)
Physician Discharge Summary  Patient ID: Travis Fletcher MRN: [id number] DOB/AGE: 07-15-1955 57 y.o.  Admit date: 07/13/2012 Discharge date: 07/14/2012  Primary Discharge Diagnosis Angina pectoris CAD S/P PTCA to the RCA  Secondary Discharge Diagnosis Hypertension Diabetes mellitus type 1 uncontrolled Hyperlipidemia  Significant Diagnostic Studies: 07/13/12: Left heart catheterization including hemodynamic monitoring of the left ventricle, LV gram. Selective right and left coronary arteriography. PTCA and cutting balloon angioplasty of the PDA branch of RCA with 2.25x72mm Cutting balloon.  Hospital Course:  Patient is a 57 year-old male with history of CAD with stenting of proximal and mid circumflex  coronary artery with implantation of a 4.0 x 15 mm Integrity stent on 01/06/11 and Ramus intermediate Mid balloon angioplasty with 2.0x10 mm balloon at that time, hypertension, hyperlipidemia, Diabetes Mellitus who presents with recurrent chest pain suggestive of angina pectoris and on maximal medical therapy.  Patient admitted electively for coronary angiography due to recurrent chest discomfort in spite of aggressive medical therapy. He was found to have high-grade stenosis of the PDA branch of th right coronary artery and underwent cutting balloon antiplastic the same. The following day patient felt better and has not had any recurrence of chest pain. He complained of cough that has been lingering over the last one month and has been productive and requests antibiotics. Otherwise no specific complaints. He was being discharged home with addition of Zithromax, Z-Pak.   Recommendations on discharge: I expect significant improvement in his anginal symptoms. Previously placed stents in the circumflex and balloon angioplasty site at the ramus intermediate is widely patent. Patient refuses to be on statin therapy in spite of hyperlipidemia as he states that it causes him to have severe myalgias. I have  tried pretty much every statin  including taking it on a 1-2 doses per week.  Discharge Exam: Blood pressure 129/74, pulse 88, temperature 97.4 F (36.3 C), temperature source Axillary, resp. rate 13, height 5\' 10"  (1.778 m), weight 83.915 kg (185 lb), SpO2 98.00%.    General appearance: alert, cooperative and appears stated age Resp: clear to auscultation bilaterally Cardio: regular rate and rhythm, S1, S2 normal, no murmur, click, rub or gallop Extremities: extremities normal, atraumatic, no cyanosis or edema Neurologic: Grossly normal Incision/Wound: Right radial site good    Labs:   Lab Results  Component Value Date   WBC 7.6 07/14/2012   HGB 14.6 07/14/2012   HCT 41.6 07/14/2012   MCV 90.6 07/14/2012   PLT 189 07/14/2012    Lab 07/14/12 0450  NA 140  K 4.3  CL 106  CO2 27  BUN 11  CREATININE 0.73  CALCIUM 8.9  PROT --  BILITOT --  ALKPHOS --  ALT --  AST --  GLUCOSE 128*   Lab Results  Component Value Date   CKTOTAL 47 01/07/2011   CKMB 1.9 01/07/2011   TROPONINI <0.30 01/07/2011    Lipid Panel     Component Value Date/Time   CHOL 161 01/05/2011 0610   TRIG 114 01/05/2011 0610   HDL 51 01/05/2011 0610   CHOLHDL 3.2 01/05/2011 0610   VLDL 23 01/05/2011 0610   LDLCALC 87 01/05/2011 0610    EKG: normal EKG, normal sinus rhythm, unchanged from previous tracings.   FOLLOW UP PLANS AND APPOINTMENTS Discharge Orders    Future Orders Please Complete By Expires   Amb Referral to Cardiac Rehabilitation      Comments:   Referring to [facility] Phase 2       Medication List  As of 07/14/2012  9:23 AM    STOP taking these medications         amLODipine 10 MG tablet   Commonly known as: NORVASC      TAKE these medications         aspirin EC 81 MG tablet   Take 243 mg by mouth daily.      azithromycin 1 G powder   Commonly known as: ZITHROMAX   Take 1 packet by mouth once.      insulin lispro 100 UNIT/ML injection   Commonly known as: HUMALOG   Inject  into the skin continuous. Patient on humalog pump.      losartan 25 MG tablet   Commonly known as: COZAAR   Take 12.5 mg by mouth daily.      metoprolol tartrate 25 MG tablet   Commonly known as: LOPRESSOR   Take 12.5 mg by mouth 2 (two) times daily.      nitroGLYCERIN 0.4 MG SL tablet   Commonly known as: NITROSTAT   Place 1 tablet (0.4 mg total) under the tongue every 5 (five) minutes as needed for chest pain.      Ticagrelor 90 MG Tabs tablet   Commonly known as: BRILINTA   Take 1 tablet (90 mg total) by mouth 2 (two) times daily.           Follow-up Information    Follow up with Pamella Pert, MD. (Kep scheduled appointment)    Contact information:   [address] [place] [address] [phone]           Pamella Pert, MD 07/14/2012, 9:23 AM  Pager: [phone] Office: [phone] If no answer: [phone]

## 2012-11-28 DIAGNOSIS — E113299 Type 2 diabetes mellitus with mild nonproliferative diabetic retinopathy without macular edema, unspecified eye: Secondary | ICD-10-CM | POA: Insufficient documentation

## 2012-12-03 ENCOUNTER — Ambulatory Visit
Admission: RE | Admit: 2012-12-03 | Discharge: 2012-12-03 | Disposition: A | Discharge status: Home / Self Care | Payer: BC Managed Care – PPO | Source: Ambulatory Visit | Attending: Family Medicine | Admitting: Family Medicine

## 2012-12-03 ENCOUNTER — Other Ambulatory Visit: Payer: Self-pay | Admitting: Family Medicine

## 2012-12-03 DIAGNOSIS — R509 Fever, unspecified: Secondary | ICD-10-CM

## 2014-01-30 ENCOUNTER — Other Ambulatory Visit: Payer: Self-pay | Admitting: Orthopedic Surgery

## 2014-01-30 DIAGNOSIS — M25512 Pain in left shoulder: Secondary | ICD-10-CM

## 2014-02-01 ENCOUNTER — Other Ambulatory Visit: Payer: Self-pay | Admitting: Orthopedic Surgery

## 2014-02-01 DIAGNOSIS — Z139 Encounter for screening, unspecified: Secondary | ICD-10-CM

## 2014-02-05 ENCOUNTER — Other Ambulatory Visit: Payer: BC Managed Care – PPO

## 2014-02-20 ENCOUNTER — Ambulatory Visit
Admission: RE | Admit: 2014-02-20 | Discharge: 2014-02-20 | Disposition: A | Discharge status: Home / Self Care | Payer: BC Managed Care – PPO | Source: Ambulatory Visit | Attending: Orthopedic Surgery | Admitting: Orthopedic Surgery

## 2014-02-20 DIAGNOSIS — M25512 Pain in left shoulder: Secondary | ICD-10-CM

## 2014-02-20 DIAGNOSIS — Z139 Encounter for screening, unspecified: Secondary | ICD-10-CM

## 2014-05-18 ENCOUNTER — Encounter (HOSPITAL_COMMUNITY): Payer: Self-pay | Admitting: Cardiology

## 2014-07-05 ENCOUNTER — Encounter (HOSPITAL_COMMUNITY): Payer: Self-pay | Admitting: Pharmacy Technician

## 2014-07-09 DIAGNOSIS — E1159 Type 2 diabetes mellitus with other circulatory complications: Secondary | ICD-10-CM

## 2014-07-09 DIAGNOSIS — I209 Angina pectoris, unspecified: Secondary | ICD-10-CM

## 2014-07-11 ENCOUNTER — Ambulatory Visit (HOSPITAL_COMMUNITY)
Admission: RE | Admit: 2014-07-11 | Discharge: 2014-07-12 | Disposition: A | Discharge status: Home / Self Care | Payer: BLUE CROSS/BLUE SHIELD | Source: Ambulatory Visit | Attending: Cardiology | Admitting: Cardiology

## 2014-07-11 ENCOUNTER — Encounter (HOSPITAL_COMMUNITY)
Admission: RE | Disposition: A | Discharge status: Home / Self Care | Payer: BLUE CROSS/BLUE SHIELD | Source: Ambulatory Visit | Attending: Cardiology

## 2014-07-11 ENCOUNTER — Encounter (HOSPITAL_COMMUNITY): Payer: Self-pay | Admitting: General Practice

## 2014-07-11 DIAGNOSIS — Z794 Long term (current) use of insulin: Secondary | ICD-10-CM | POA: Insufficient documentation

## 2014-07-11 DIAGNOSIS — T82858A Stenosis of vascular prosthetic devices, implants and grafts, initial encounter: Secondary | ICD-10-CM | POA: Diagnosis not present

## 2014-07-11 DIAGNOSIS — Z955 Presence of coronary angioplasty implant and graft: Secondary | ICD-10-CM | POA: Diagnosis not present

## 2014-07-11 DIAGNOSIS — E1065 Type 1 diabetes mellitus with hyperglycemia: Secondary | ICD-10-CM | POA: Diagnosis not present

## 2014-07-11 DIAGNOSIS — Y848 Other medical procedures as the cause of abnormal reaction of the patient, or of later complication, without mention of misadventure at the time of the procedure: Secondary | ICD-10-CM | POA: Insufficient documentation

## 2014-07-11 DIAGNOSIS — I209 Angina pectoris, unspecified: Secondary | ICD-10-CM

## 2014-07-11 DIAGNOSIS — E785 Hyperlipidemia, unspecified: Secondary | ICD-10-CM | POA: Insufficient documentation

## 2014-07-11 DIAGNOSIS — I25119 Atherosclerotic heart disease of native coronary artery with unspecified angina pectoris: Secondary | ICD-10-CM | POA: Insufficient documentation

## 2014-07-11 DIAGNOSIS — E1159 Type 2 diabetes mellitus with other circulatory complications: Secondary | ICD-10-CM

## 2014-07-11 DIAGNOSIS — Z9861 Coronary angioplasty status: Secondary | ICD-10-CM

## 2014-07-11 HISTORY — DX: Pure hypercholesterolemia, unspecified: E78.00

## 2014-07-11 HISTORY — PX: LEFT HEART CATHETERIZATION WITH CORONARY ANGIOGRAM: SHX5451

## 2014-07-11 HISTORY — DX: Long term (current) use of insulin: Z79.4

## 2014-07-11 HISTORY — PX: CORONARY ANGIOPLASTY: SHX604

## 2014-07-11 HISTORY — DX: Calculus of kidney: N20.0

## 2014-07-11 HISTORY — DX: Malignant neoplasm of prostate: C61

## 2014-07-11 HISTORY — DX: Type 2 diabetes mellitus without complications: E11.9

## 2014-07-11 LAB — GLUCOSE, CAPILLARY
GLUCOSE-CAPILLARY: 253 mg/dL — AB (ref 70–99)
GLUCOSE-CAPILLARY: 92 mg/dL (ref 70–99)
Glucose-Capillary: 265 mg/dL — ABNORMAL HIGH (ref 70–99)

## 2014-07-11 SURGERY — LEFT HEART CATHETERIZATION WITH CORONARY ANGIOGRAM
Anesthesia: LOCAL

## 2014-07-11 MED ORDER — DIAZEPAM 5 MG PO TABS: 5.0000 mg | ORAL_TABLET | ORAL | Status: DC | PRN

## 2014-07-11 MED ORDER — SODIUM CHLORIDE 0.9 % IV SOLN: 250.0000 mL | INTRAVENOUS | Status: DC | PRN

## 2014-07-11 MED ORDER — FAMOTIDINE IN NACL 20-0.9 MG/50ML-% IV SOLN
INTRAVENOUS | Status: AC
  Filled 2014-07-11: qty 50

## 2014-07-11 MED ORDER — LIDOCAINE HCL (PF) 1 % IJ SOLN
INTRAMUSCULAR | Status: AC
  Filled 2014-07-11: qty 30

## 2014-07-11 MED ORDER — METOPROLOL TARTRATE 12.5 MG HALF TABLET
12.5000 mg | ORAL_TABLET | Freq: Two times a day (BID) | ORAL | Status: DC
  Administered 2014-07-11 – 2014-07-12 (×2): 12.5 mg via ORAL
  Filled 2014-07-11 (×3): qty 1

## 2014-07-11 MED ORDER — SODIUM CHLORIDE 0.9 % IJ SOLN: 3.0000 mL | INTRAMUSCULAR | Status: DC | PRN

## 2014-07-11 MED ORDER — SODIUM CHLORIDE 0.9 % IV SOLN
Freq: Once | INTRAVENOUS | Status: AC
  Administered 2014-07-11: 500 mL via INTRAVENOUS

## 2014-07-11 MED ORDER — NITROGLYCERIN 0.4 MG SL SUBL: 0.4000 mg | SUBLINGUAL_TABLET | SUBLINGUAL | Status: DC | PRN

## 2014-07-11 MED ORDER — SODIUM CHLORIDE 0.9 % IV SOLN: INTRAVENOUS | Status: DC

## 2014-07-11 MED ORDER — INSULIN ASPART 100 UNIT/ML ~~LOC~~ SOLN: 4.0000 [IU] | SUBCUTANEOUS | Status: DC

## 2014-07-11 MED ORDER — ASPIRIN 81 MG PO CHEW: 81.0000 mg | CHEWABLE_TABLET | ORAL | Status: DC

## 2014-07-11 MED ORDER — ACETAMINOPHEN 325 MG PO TABS: 650.0000 mg | ORAL_TABLET | ORAL | Status: DC | PRN

## 2014-07-11 MED ORDER — HYDROMORPHONE HCL 1 MG/ML IJ SOLN
INTRAMUSCULAR | Status: AC
  Filled 2014-07-11: qty 1

## 2014-07-11 MED ORDER — SODIUM CHLORIDE 0.9 % IJ SOLN: 3.0000 mL | Freq: Two times a day (BID) | INTRAMUSCULAR | Status: DC

## 2014-07-11 MED ORDER — MIDAZOLAM HCL 2 MG/2ML IJ SOLN
INTRAMUSCULAR | Status: AC
  Filled 2014-07-11: qty 2

## 2014-07-11 MED ORDER — BIVALIRUDIN 250 MG IV SOLR
INTRAVENOUS | Status: AC
  Filled 2014-07-11: qty 250

## 2014-07-11 MED ORDER — NITROGLYCERIN 1 MG/10 ML FOR IR/CATH LAB
INTRA_ARTERIAL | Status: AC
  Filled 2014-07-11: qty 10

## 2014-07-11 MED ORDER — INSULIN ASPART 100 UNIT/ML ~~LOC~~ SOLN
0.0000 [IU] | Freq: Three times a day (TID) | SUBCUTANEOUS | Status: DC
  Filled 2014-07-11: qty 0.2

## 2014-07-11 MED ORDER — SODIUM CHLORIDE 0.9 % IJ SOLN
3.0000 mL | Freq: Two times a day (BID) | INTRAMUSCULAR | Status: DC
Start: 2014-07-11 — End: 2014-07-11

## 2014-07-11 MED ORDER — ASPIRIN 325 MG PO TABS
325.0000 mg | ORAL_TABLET | Freq: Every day | ORAL | Status: DC
  Administered 2014-07-12: 325 mg via ORAL
  Filled 2014-07-11: qty 1

## 2014-07-11 MED ORDER — CLOPIDOGREL BISULFATE 300 MG PO TABS
ORAL_TABLET | ORAL | Status: AC
  Filled 2014-07-11: qty 2

## 2014-07-11 MED ORDER — ONDANSETRON HCL 4 MG/2ML IJ SOLN: 4.0000 mg | Freq: Four times a day (QID) | INTRAMUSCULAR | Status: DC | PRN

## 2014-07-11 MED ORDER — INSULIN PUMP
Freq: Three times a day (TID) | SUBCUTANEOUS | Status: DC
  Administered 2014-07-12: 3.8 via SUBCUTANEOUS
  Filled 2014-07-11: qty 1

## 2014-07-11 MED ORDER — VERAPAMIL HCL 2.5 MG/ML IV SOLN
INTRAVENOUS | Status: AC
  Filled 2014-07-11: qty 2

## 2014-07-11 MED ORDER — ADENOSINE 12 MG/4ML IV SOLN
16.0000 mL | Freq: Once | INTRAVENOUS | Status: DC
  Filled 2014-07-11: qty 16

## 2014-07-11 MED ORDER — HEPARIN (PORCINE) IN NACL 2-0.9 UNIT/ML-% IJ SOLN
INTRAMUSCULAR | Status: AC
  Filled 2014-07-11: qty 1000

## 2014-07-11 MED ORDER — LOSARTAN POTASSIUM 25 MG PO TABS
12.5000 mg | ORAL_TABLET | Freq: Every day | ORAL | Status: DC
  Administered 2014-07-12: 12.5 mg via ORAL
  Filled 2014-07-11: qty 0.5

## 2014-07-11 MED ORDER — SODIUM CHLORIDE 0.9 % IV SOLN: 1.0000 mL/kg/h | INTRAVENOUS | Status: AC

## 2014-07-11 MED ORDER — CLOPIDOGREL BISULFATE 75 MG PO TABS
75.0000 mg | ORAL_TABLET | Freq: Every day | ORAL | Status: DC
  Administered 2014-07-12: 75 mg via ORAL
  Filled 2014-07-11: qty 1

## 2014-07-11 NOTE — Progress Notes (Signed)
Report given to A Mitchell/lunch relief.

## 2014-07-11 NOTE — CV Procedure (Signed)
Procedure performed:  Left heart catheterization including hemodynamic monitoring of the left ventricle, LV gram. Selective right and left coronary arteriography. PTCA and balloon angioplasty of the PDA with 2.0x30 mm Emerge balloon. FFR to Mid LAD (0.8).  Indication: Patient is a 59 year-old Caucasian male with history of  hyperlipidemia, type 1  Diabetes Mellitus   who presents with class III angina pectoris, unable to do you and minimal activity without having to stop due to chest pain, frequent is a subungual nitroglycerin. Patient has low blood pressure, unable to use any other antianginal agents except nitroglycerin and small dose of beta blocker. Due to his persistent symptoms, symptoms very similar to angina prior to his antroplasty, he was directly brought to the coronary angiography suite to evaluate his coronary anatomy. Patient has history of cutting balloon angioplasty to the PDA with a 2.25 x 10 mm cutting balloon on 07/13/2012, mid circumflex stent on 01/06/2011 with 4.0 x 15 mm integrity non-DES, and balloon antiplastic to the ramus intermediate at the same setting.  FFR to the LAD was performed because of napkin ringlike lesion in the mid LAD.  Hemodynamic data: Left ventricular pressure was 111/0 with LVEDP of 6 mm mercury. Aortic pressure was 101/59 with a mean of 73 mm mercury. There was no pressure gradient across the aortic valve.   Left ventricle: Performed in the RAO projection revealed LVEF of 55-60 %. There was no significant MR. no wall motion abnormality.  Right coronary artery:  Dominant. There is mild disease in the proximal segment. PDA is a moderate caliber vessel, with severe diffuse disease constituting anywhere from 90% to 70-80% tandem lesions.  Left main coronary artery is large and normal.  Circumflex coronary artery: A large vessel giving origin to several small marginals. The mid circumflex stent placed on 01/06/2011 is widely patent.   Ramus intermediate: The  proximal segment of the ramus shows 10-20% stenosis. Prior balloon angioplasty of right widely patent. Small calibered vessel.  LAD:  LAD gives origin to a large diagonal-1.  LAD has mild diffuse luminal irregularities. Mid segment of the LAD has a 20-30% stenosis, followed by a napkin ringlike lesion which constitute about 60% in some views to 70%. Mild calcification is evident.  Impression: Severe restenosis in the PDA branch of the right coronary artery. Intermediate coronary artery disease in the mid LAD. Due to his symptoms of angina pectoris which are very concerning for progression of CAD, I'll proceed with FFR to the LAD and if insignificant, proceed with balloon angioplasty to the PDA. If LAD stenosis is significant, will revascularize both PDA and LAD.  FFR data: The FFR fell to 0.80 with intravenous adenosine infusion. The mid LAD stenosis appears to be at least intermediate significance physiologically. Will proceed with antiplastic to the PDA, if patient continues to have recurrent angina pectoris, then we will proceed with PCI to the LAD at a later date.  Interventional data: Successful PTCA and balloon angioplasty of the PDA branch of the right coronary artery with a 2.0 x 30 mm emerge balloon, stenosis reduced from 90% to less than 10%, brisk TIMI-3 flow was maintained, no evidence of edge dissection. The whole long segment was very smooth at the end of the procedure. Excellent blush was evident in the myocardium.  Will need Dual antiplatelet therapy with Plavix and ASA 81 mg for at least 6 months to a year due to significant small vessel coronary artery disease.   Technique of diagnostic cardiac catheterization:  Under sterile  precautions using a 6 French right radial  arterial access, a 6 French sheath was introduced into the right radial artery. A 5 [place] Tig 4 catheter was advanced into the ascending aorta selective  right coronary artery and left coronary artery was cannulated and  angiography was performed in multiple views. The catheter was pulled back Out of the body over exchange length J-wire. Same Catheter was used to perform LV gram which was performed in RAO projection.   Technique of FFR evaluation to the mid LAD: Same catheter diagnostic Tiger for was utilized to engage the left main coronary artery, using Angiomax for the coordination, ACT greater than 200, I passed the  [facility] [place] pressure wire to the mid LAD. In a standard fashion, intravenous adenosine was administered for 2 minutes, FFR dropped to 0.80. The lesion was felt to be intermediate and hence left alone. The diagnostic catheter was then pulled out of the body over J-wire.   Technique of intervention:  Using a 6 [place] JR4 guide catheter the right  coronary  was selected and cannulated.  I utilized a the pressure wire and across the PDA branch of the right coronary artery without any difficulty. I placed the tip of the wire into the distal  coronary artery. Angiography was performed. Then I utilized a 2.0 x 30 mm emerge balloon , I performed balloon angioplasty, 3 inflations were performed and where from 4 atmospheric pressure to a maximum of 6 atmospheric pressure for 150 seconds. Intracoronary nitroglycerin was also administered and angiography was performed. Excellent result was evident. The guidewire was withdrawn, guide catheter disengaged and pulled out of the body over J-wire. Hemostasis was obtained by applying TR band. Patient tolerated the pressure. There was no immediate competitions. A total of 125 mL of contrast was utilized for diagnostic and enmeshed procedure.  Disposition: Patient will be discharged in AM unless complications with out-patient follow up.

## 2014-07-11 NOTE — H&P (Signed)
  Please see office visit notes for complete details of HPI.  

## 2014-07-11 NOTE — Interval H&P Note (Signed)
History and Physical Interval Note:  07/11/2014 10:41 AM  Travis Fletcher  has presented today for surgery, with the diagnosis of abnormal cardiovasular testing  The various methods of treatment have been discussed with the patient and family. After consideration of risks, benefits and other options for treatment, the patient has consented to  Procedure(s): LEFT HEART CATHETERIZATION WITH CORONARY ANGIOGRAM (N/A) possible PCI as a surgical intervention .  The patient's history has been reviewed, patient examined, no change in status, stable for surgery.  I have reviewed the patient's chart and labs.  Questions were answered to the patient's satisfaction.   Cath Lab Visit (complete for each Cath Lab visit)  Clinical Evaluation Leading to the Procedure:   ACS: No.  Non-ACS:    Anginal Classification: CCS III  Anti-ischemic medical therapy: Maximal Therapy (2 or more classes of medications)  Non-Invasive Test Results: No non-invasive testing performed  Prior CABG: No previous CABG        [facility]

## 2014-07-11 NOTE — Progress Notes (Signed)
TR BAND REMOVAL  LOCATION:    right radial  DEFLATED PER PROTOCOL:    Yes.    TIME BAND OFF / DRESSING APPLIED:    1600   SITE UPON ARRIVAL:    Level 0  SITE AFTER BAND REMOVAL:    Level 0  REVERSE ALLEN'S TEST:     positive  CIRCULATION SENSATION AND MOVEMENT:    Within Normal Limits   Yes.    COMMENTS:   Tolerated procedure well 

## 2014-07-12 DIAGNOSIS — T82858A Stenosis of vascular prosthetic devices, implants and grafts, initial encounter: Secondary | ICD-10-CM | POA: Diagnosis not present

## 2014-07-12 DIAGNOSIS — Z794 Long term (current) use of insulin: Secondary | ICD-10-CM | POA: Diagnosis not present

## 2014-07-12 DIAGNOSIS — I25119 Atherosclerotic heart disease of native coronary artery with unspecified angina pectoris: Secondary | ICD-10-CM | POA: Diagnosis not present

## 2014-07-12 DIAGNOSIS — E785 Hyperlipidemia, unspecified: Secondary | ICD-10-CM | POA: Diagnosis not present

## 2014-07-12 LAB — CBC
HCT: 39.8 % (ref 39.0–52.0)
Hemoglobin: 13.9 g/dL (ref 13.0–17.0)
MCH: 31 pg (ref 26.0–34.0)
MCHC: 34.9 g/dL (ref 30.0–36.0)
MCV: 88.8 fL (ref 78.0–100.0)
Platelets: 177 10*3/uL (ref 150–400)
RBC: 4.48 MIL/uL (ref 4.22–5.81)
RDW: 11.9 % (ref 11.5–15.5)
WBC: 7.1 10*3/uL (ref 4.0–10.5)

## 2014-07-12 LAB — BASIC METABOLIC PANEL
Anion gap: 5 (ref 5–15)
BUN: 10 mg/dL (ref 6–23)
CHLORIDE: 106 mmol/L (ref 96–112)
CO2: 28 mmol/L (ref 19–32)
Calcium: 8.6 mg/dL (ref 8.4–10.5)
Creatinine, Ser: 0.93 mg/dL (ref 0.50–1.35)
GFR calc Af Amer: >90 mL/min (ref >90)
Glucose, Bld: 133 mg/dL — ABNORMAL HIGH (ref 70–99)
Potassium: 4 mmol/L (ref 3.5–5.1)
Sodium: 139 mmol/L (ref 135–145)

## 2014-07-12 LAB — GLUCOSE, CAPILLARY
Glucose-Capillary: 137 mg/dL — ABNORMAL HIGH (ref 70–99)
Glucose-Capillary: 119 mg/dL — ABNORMAL HIGH (ref 70–99)

## 2014-07-12 LAB — POCT ACTIVATED CLOTTING TIME
Activated Clotting Time: 220 seconds
Activated Clotting Time: 466 seconds

## 2014-07-12 MED ORDER — ASPIRIN EC 81 MG PO TBEC: 81.0000 mg | DELAYED_RELEASE_TABLET | Freq: Every day | ORAL | Status: DC

## 2014-07-12 MED ORDER — CLOPIDOGREL BISULFATE 75 MG PO TABS: 75.0000 mg | ORAL_TABLET | Freq: Every day | ORAL | Status: DC

## 2014-07-12 MED FILL — Sodium Chloride IV Soln 0.9%: INTRAVENOUS | Qty: 50 | Status: AC

## 2014-07-12 NOTE — Discharge Summary (Signed)
Physician Discharge Summary  Patient ID: Travis Fletcher MRN: [id number] DOB/AGE: 1955/12/19 59 y.o.  Admit date: 07/11/2014 Discharge date: 07/12/2014  Primary Discharge Diagnosis: CAD s/p PTCA and balloon angioplasty of the PDA branch of RCA Secondary Discharge Diagnosis: Hyperlipidemia, DM I uncontrolled  Significant Diagnostic Studies: Coronary angiography 07/11/2014: Severe restenosis in the PDA branch of the right coronary artery. Intermediate coronary artery disease in the mid LAD. FFR 0.80 Patient has history of cutting balloon angioplasty to the PDA with a 2.25 x 10 mm cutting balloon on 07/13/2012, mid circumflex stent on 01/06/2011 with 4.0 x 15 mm integrity non-DES, and balloon antiplastic to the ramus intermediate at the same setting.  Hospital Course: Patient is a 59 year-old Caucasian male with history ofhyperlipidemia, type 1 Diabetes Mellitus who presented with class III angina pectoris, unable to do even minimal activity without having to stop due to chest pain, frequent use of sublingual nitroglycerin. Patient has low blood pressure, unable to use any other antianginal agents except nitroglycerin and small dose of beta blocker. Due to his persistent symptoms, symptoms very similar to angina prior to his angioplasty, he was directly brought to the coronary angiography suite to evaluate his coronary anatomy. Coronary angiography revealed severe restenosis in the PDA branch of the right coronary artery. Intermediate coronary artery disease in the mid LAD. Successful PTCA and balloon angioplasty of the PDA branch of the right coronary artery with a 2.0 x 30 mm emerge balloon, stenosis reduced from 90% to less than 10%. Pt denies any chest pain this morning, states he feels well.    Recommendations on discharge: Follow up outpatient as scheduled.  Continue ASA and plavix. Consider PCSK9 inhibitor due to CAD, hyperlipidemia, statin intolerance. If chest pain continues will need PCI to the  mid LAD.  Discharge Exam: Blood pressure 128/61, pulse 87, temperature 98.3 F (36.8 C), temperature source Oral, resp. rate 18, height 5\' 10"  (1.778 m), weight 93.1 kg (205 lb 4 oz), SpO2 100 %.    General appearance: alert, cooperative, appears stated age and no distress Chest wall: no tenderness Cardio: regular rate and rhythm, S1, S2 normal, no murmur, click, rub or gallop GI: soft, non-tender; bowel sounds normal; no masses,  no organomegaly Extremities: extremities normal, atraumatic, no cyanosis or edema Pulses: 2+ and symmetric Right radial access healed well Neurologic: Grossly normal Labs:   Lab Results  Component Value Date   WBC 7.1 07/12/2014   HGB 13.9 07/12/2014   HCT 39.8 07/12/2014   MCV 88.8 07/12/2014   PLT 177 07/12/2014    Recent Labs Lab 07/12/14 0427  NA 139  K 4.0  CL 106  CO2 28  BUN 10  CREATININE 0.93  CALCIUM 8.6  GLUCOSE 133*   Lab Results  Component Value Date   CKTOTAL 47 01/07/2011   CKMB 1.9 01/07/2011   TROPONINI <0.30 01/07/2011    Lipid Panel     Component Value Date/Time   CHOL 161 01/05/2011 0610   TRIG 114 01/05/2011 0610   HDL 51 01/05/2011 0610   CHOLHDL 3.2 01/05/2011 0610   VLDL 23 01/05/2011 0610   LDLCALC 87 01/05/2011 0610    EKG:NSR, normal axis. No ischemia.    Radiology: No results found.    FOLLOW UP PLANS AND APPOINTMENTS    Medication List    STOP taking these medications        aspirin 325 MG tablet  Replaced by:  aspirin EC 81 MG tablet     azithromycin  1 G powder  Commonly known as:  ZITHROMAX     ticagrelor 90 MG Tabs tablet  Commonly known as:  BRILINTA      TAKE these medications        aspirin EC 81 MG tablet  Take 1 tablet (81 mg total) by mouth daily.     clopidogrel 75 MG tablet  Commonly known as:  PLAVIX  Take 1 tablet (75 mg total) by mouth daily with breakfast.     insulin aspart 100 UNIT/ML injection  Commonly known as:  novoLOG  Inject into the skin 3 (three)  times daily before meals. Has a pump     losartan 25 MG tablet  Commonly known as:  COZAAR  Take 12.5 mg by mouth daily.     metoprolol tartrate 25 MG tablet  Commonly known as:  LOPRESSOR  Take 12.5 mg by mouth 2 (two) times daily.     nitroGLYCERIN 0.4 MG SL tablet  Commonly known as:  NITROSTAT  Place 1 tablet (0.4 mg total) under the tongue every 5 (five) minutes as needed for chest pain.          Rachel Bo, NP-C 07/12/2014, 7:49 AM [facility] Pager: [phone] Office: [phone]  I have personally reviewed the patient's record and performed physical exam and agree with the assessment and plan of Ms. Neldon Labella, NP-C.  Laverda Page, MD [facility] Pager: [phone].   Office: [phone] If no answer: Mobile: [phone]

## 2014-07-12 NOTE — Progress Notes (Signed)
CARDIAC REHAB PHASE I   PRE:  Rate/Rhythm: 88 SR  BP:  Supine:   Sitting: 153/70  Standing:    SaO2:   MODE:  Ambulation: 1000 ft   POST:  Rate/Rhythm: 100 SR  BP:  Supine:   Sitting: 154/78  Standing:    SaO2:  0815-0910 Pt tolerated ambulation well without c/o of cp or SOB. VS stable Pt back to recliner after walk. Completed stent discharge education with pt and wife. Pt voices understanding. He declines Outpt. CRP not interested. Pt also declines exercise guidelines and diabetic diet education. He feels that he exercises enough with work and the activities that he does around the house. I encouraged him to do daily exercise.  Rodney Langton RN 07/12/2014 9:16 AM

## 2014-07-12 NOTE — Progress Notes (Signed)
Tech offered Pt bath, he stated that he will take care of shower once he return home

## 2015-06-14 NOTE — H&P (Signed)
OFFICE VISIT NOTES COPIED TO EPIC FOR DOCUMENTATION  Travis Fletcher 07-12-2015 9:27 AM Location: [facility] Patient #: [id number] DOB: 02-03-1956 Married / Language: Cleophus Molt / Race: White Male   History of Present Illness Travis Fletcher AGNP-C; 07-12-2015 12:18 PM) The patient is a 60 year old male who presents for a follow-up for Coronary artery disease. He has a history of hyperlipidemia with statin intolerance, uncontrolled type I diabetes, and CAD (Coronary angiogram 07/11/2014: 2.0 x 30 mm balloon angioplasty to the PDA (07/13/12 restenosis). FFR to mid LAD 0.80 (intermediate, if recurrence of chest pain, consider PCI)). He reports persistent fatigue since coronary angiogram in February, however had denied any other symptoms. He states 2-3 weeks ago ago he developed mild chest discomfort with exertion. Symptoms have become progressively worse over the past several days. He states he woke up with chest pain this morning and an called to be seen on an urgent basis today. He denies any chest pain presently.   Problem List/Past Medical Anderson Malta Sergeant; Jul 12, 2015 11:08 AM) Atherosclerosis of native coronary artery of native heart with angina pectoris (I25.119) CAD/ASHD: Coronary angiogram 07/11/2014: 2.0 x 30 mm balloon angioplasty to the PDA(07/13/12 restenosis). FFR to mid LAD 0.80 (intermediate, if recurrence of chest pain, consider PCI). Prior PCI on 01/06/11 to Mid Circumflex 4.0x15 mm Integrity non DES. Balloon PTCA small Ramus Intermediate patent. EF 55-60% Exercise sestamibi: 04/11/11: Positive EKG at 8 min of exercise. No chest pain. Normal perfusion and EF and normal wall motion. Hypercholesteremia (E78.00) Labs 12/25/2014: Total cholesterol 141, triglycerides 103, HDL 57, LDL 63, LDL particle # 599, serum glucose 251, creatinine 1.06, CMP otherwise normal Labs 10/30/2014: Total cholesterol 236, triglycerides 120, HDL 56, LDL 156, LDL particle # 1849, serum glucose 124, BMP normal  Labs 07/05/2014: Total cholesterol 272, triglycerides 138, HDL 60, LDL 184, LDL particle # 2008, LP-IR Score 31, TSH 2.130 Labs 04/05/2013: Total cholesterol 151, triglycerides 83, HDL 52, LDL 82. Liver enzymes within normal limits. Labs are within normal limits. Continue present medical therapy. Labs 12/23/10 TC 248, TG 99, HDL 65, LDL 163. Has stopped Crestor due to cost. Uncontrolled type 1 diabetes mellitus with mild nonproliferative retinopathy without macular edema, with long-term current use of insulin (E10.329, E10.65) HbA1c 7.9 on 10/24/2014. Follows Dr. Chalmers Cater. H/o Vitreous bleed 12/16/2014 Angina, class II (I20.9) Family history of ischemic heart disease (Z82.49) Postsurgical percutaneous transluminal coronary angioplasty status (Z98.61) 07/11/2014: Coronary angiography revealed severe restenosis in the PDA branch of the right coronary artery. Intermediate coronary artery disease in the mid LAD. Successful PTCA and balloon angioplasty of the PDA branch of the right coronary artery with a 2.0 x 30 mm emerge balloon 07/13/12: 2.25x10 mm cutting balloon PTCA to PDA. 01/06/11: Mid Circumflex 70% to 0% with 4.0x15 mm Integrity non DES. Balloon PTCA of very small Ramus Intermediate. EF 70% Long term use of drug (Z79.899) Encounter for preprocedural cardiovascular examination (Z01.810) Labs 07/05/2014: CBC normal, serum glucose 195, creatinine 1.00, potassium 5.3, CMP otherwise normal, PT 10.3, INR 1.0, aPTT 28 Statin intolerance (Z78.9) Malaise and fatigue (R53.81, R53.83) Labs 08/29/2014: Total testosterone 519, free testosterone 12.4 Other nonspecific abnormal cardiovascular system function study (794.39)  Allergies Anderson Malta Sergeant; 07-12-15 11:08 AM) Chanda Busing *ANTIHYPERLIPIDEMICS* Cough. Statins Depletion *DIETARY PRODUCTS/DIETARY MANAGEMENT PRODUCTS* joint pain, muscle aches  Family History Anderson Malta Sergeant; 2015/07/12 11:08 AM) Mother Deceased. at age 39, died in her sleep. Hx of Stroke  at age 40, Atrial Fibrillation Father Deceased. at age 56, died in his sleep. Hx Atrial  Fibrillation. Sister 3 Sister 52- 20 years older Sister 21- 25 years older Sister 69- 91 years older  Social History Anderson Malta Sergeant; 06/14/2015 11:08 AM) Current tobacco use Never smoker. Alcohol Use Occasional alcohol use. Marital status Married. Number of Children 2.  Past Surgical History Anderson Malta Sergeant; 06/14/2015 11:19 AM) Tonsillectomy 1977. Partial thyroidectomy 1979 Hernia repair 1987 Prostate cancer 2010-seed implants. Cataract surgery on both eyes 2000.  Detached retina in left eye 2010. Split retina in right eye 2010. 01/06/11: Mid Circumflex 70% to 0% with 4.0x15 mm Integrity non DES. Balloon PTCA of Ramus Intermediate 95% to <20%. EF 70%/ Laser Surgery12/2016 Left. to repair a hemorrhage in the eye  Medication History Anderson Malta Sergeant; 06/14/2015 11:21 AM) Maryelizabeth Kaufmann SureClick (140MG /ML Soln Auto-inj, 1 inject Milliliter Subcutaneous every two weeks, Taken starting 04/17/2015) Active. (faxed to [employer], [place]) Losartan Potassium (25MG  Tablet,  (one half) Tablet Oral daily, Taken starting 03/12/2015) Active. Clopidogrel Bisulfate (75MG  Tablet, 1 (one) Tablet Oral daily, Taken starting 03/12/2015) Active. Metoprolol Tartrate (25MG  Tablet, 1/2 Tablet Oral two times daily, Taken starting 01/11/2015) Active. Nitrostat (0.4MG  Tab Sublingual, 1 (one) Tab Sublingual Tab Sub Sublingual Keep one ta blet under the tonge every 5 minutes as needed for chest pain, Taken starting 11/10/2014) Active. Viagra (100MG  Tablet, 1 Oral daily as needed) Discontinued: pt. choice. Aspirin (325MG  Tablet, 1 Oral daily) Active. Novalog Insulin Pump Active. (Continuous Infusion) Medications Reconciled (verbally)  Diagnostic Studies History Anderson Malta Sergeant; 06/14/2015 11:08 AM) Colonoscopy 2001. ECG 12/08/11: NSR @ 71/min. Normal intervals. No ischemia. Exercise sestamibi: 04/11/11:  Positive EKG at 8 min of exercise. No chest pain. Normal perfusion and EF and normal wall motion.  Heart cath 01/06/11: Hyperdynamic LVEF, LVOT obstruction. Midl LAD 90% Stress EKG 01/01/11: Positive for ischemia with chest pain. 2.5 mm inf-lat ST depression. Coronary Angiogram02/07/2014 CAD/ASHD: 2.0 x 30 mm balloon angioplasty to the PDA(07/13/12 restenosis). FFR to mid LAD 0.80 (intermediate). 01/06/11: Mid Circumflex 4.0x15 mm Integrity non DES. Balloon PTCA small Ramus Intermediate patent. EF 55-60%  Other Problems Anderson Malta Sergeant; 06/14/2015 11:08 AM) Unspecified Diagnosis    Review of Systems (Bridgette Ebony Hail AGNP-C; 06/14/2015 12:18 PM) General Present- Fatigue. Not Present- Fever and Night Sweats. Respiratory Present- Decreased Exercise Tolerance and Difficulty Breathing on Exertion. Cardiovascular Present- Chest Pain. Not Present- Claudications, Edema, Fainting, Orthopnea and Palpitations. Gastrointestinal Not Present- Abdominal Pain, Constipation, Diarrhea, Nausea and Vomiting. Musculoskeletal Not Present- Joint Swelling. Neurological Not Present- Focal Neurological Symptoms and Headaches. Hematology Not Present- Blood Clots, Easy Bruising and Nose Bleed.  Vitals (April Garrison; 06/14/2015 11:27 AM) 06/14/2015 11:13 AM Weight: 207.13 lb Height: 70in Body Surface Area: 2.12 m Body Mass Index: 29.72 kg/m  Pulse: 85 (Regular)  P.OX: 98% (Room air) BP: 126/78 (Sitting, Left Arm, Standard)       Physical Exam (Bridgette Ebony Hail, AGNP-C; 06/14/2015 2:52 PM) General Mental Status-Alert. General Appearance-Cooperative, Appears stated age, Not in acute distress. Orientation-Oriented X3. Build & Nutrition-Well built and Well nourished.  Head and Neck Thyroid Gland Characteristics - no palpable nodules, no palpable enlargement.  Chest and Lung Exam Palpation Tender - No chest wall tenderness. Auscultation Breath sounds -  Clear.  Cardiovascular Inspection Jugular vein - Right - No Distention. Auscultation Heart Sounds - S1 WNL, S2 WNL and No gallop present. Murmurs & Other Heart Sounds - Murmur - No murmur.  Abdomen Palpation/Percussion Normal exam - Non Tender and No hepatosplenomegaly. Auscultation Normal exam - Bowel sounds normal.  Peripheral Vascular Lower Extremity Inspection - Left - No Pigmentation, No  Varicose veins. Right - No Pigmentation, No Varicose veins. Palpation - Edema - Left - No edema. Right - No edema. Femoral pulse - Left - Normal. Right - Normal. Popliteal pulse - Left - Normal. Right - Normal. Dorsalis pedis pulse - Left - Normal. Right - Normal. Posterior tibial pulse - Left - Normal. Right - Normal. Carotid arteries - Left-No Carotid bruit. Carotid arteries - Right-No Carotid bruit. Abdomen-No prominent abdominal aortic pulsation, No epigastric bruit. Note: right radial access site asymptomatic, 2+ radial pulse   Neurologic Motor-Grossly intact without any focal deficits.  Musculoskeletal Global Assessment Left Lower Extremity - normal range of motion without pain. Right Lower Extremity - normal range of motion without pain.    Assessment & Plan (Bridgette Ebony Hail AGNP-C; 06/14/2015 2:52 PM) Atherosclerosis of native coronary artery of native heart with angina pectoris (I25.119) Story: CAD/ASHD: Coronary angiogram 07/11/2014: 2.0 x 30 mm balloon angioplasty to the PDA(07/13/12 restenosis). FFR to mid LAD 0.80 (intermediate, if recurrence of chest pain, consider PCI). Prior PCI on 01/06/11 to Mid Circumflex 4.0x15 mm Integrity non DES. Balloon PTCA small Ramus Intermediate patent. EF 55-60%  Exercise sestamibi: 04/11/11: Positive EKG at 8 min of exercise. No chest pain. Normal perfusion and EF and normal wall motion. Impression: EKG 06/14/2015: Sinus rhythm at a rate of 75 bpm, normal axis, left atrial abnormality, no evidence of ischemia. No change from EKG on  07/20/2014. Current Plans Complete electrocardiogram (93000) Uncontrolled type 1 diabetes mellitus with mild nonproliferative retinopathy without macular edema, with long-term current use of insulin [id number]) Story: HbA1c 7.9 on 10/24/2014. Follows Dr. Chalmers Cater. H/o Vitreous bleed 12/16/2014 Hypercholesteremia (E78.00) Story: Labs 12/25/2014: Total cholesterol 141, triglycerides 103, HDL 57, LDL 63, LDL particle # 599, serum glucose 251, creatinine 1.06, CMP otherwise normal  Labs 10/30/2014: Total cholesterol 236, triglycerides 120, HDL 56, LDL 156, LDL particle # 1849, serum glucose 124, BMP normal  Labs 07/05/2014: Total cholesterol 272, triglycerides 138, HDL 60, LDL 184, LDL particle # 2008, LP-IR Score 31, TSH 2.130  Labs 04/05/2013: Total cholesterol 151, triglycerides 83, HDL 52, LDL 82. Liver enzymes within normal limits. Labs are within normal limits. Continue present medical therapy.  Labs 12/23/10 TC 248, TG 99, HDL 65, LDL 163. Has stopped Crestor due to cost. Angina, class II (I20.9) Current Plans Mechanism of underlying disease process and action of medications discussed with the patient. I discussed primary/secondary prevention and also dietary counseling was done. He presents as a STAT add on for evaluation of chest pain and dyspnea over the past 3-4 weeks, worsening over the past 2-3 days. Patient with established CAD with residual known LAD lesion. He has significant risk factors for progression of CAD including hyperlipidemia and uncontrolled type I diabetes. Schedule for cardiac catheterization, and possible angioplasty. We discussed regarding risks, benefits, alternatives to this including stress testing, CTA and continued medical therapy. Patient wants to proceed. Understands <1-2% risk of death, stroke, MI, urgent CABG, bleeding, infection, renal failure but not limited to these. Follow up after procedure.  *I have discussed this case with Dr. Einar Gip and he personally examined  the patient and participated in formulating the plan.*  Signed by Travis Fletcher, AGNP-C (06/14/2015 2:52 PM)

## 2015-06-15 ENCOUNTER — Encounter (HOSPITAL_COMMUNITY)
Admission: RE | Disposition: A | Discharge status: Home / Self Care | Payer: Self-pay | Source: Ambulatory Visit | Attending: Cardiology

## 2015-06-15 ENCOUNTER — Encounter (HOSPITAL_COMMUNITY): Payer: Self-pay | Admitting: Cardiology

## 2015-06-15 ENCOUNTER — Ambulatory Visit (HOSPITAL_COMMUNITY)
Admission: RE | Admit: 2015-06-15 | Discharge: 2015-06-15 | Disposition: A | Discharge status: Home / Self Care | Payer: BLUE CROSS/BLUE SHIELD | Source: Ambulatory Visit | Attending: Cardiology | Admitting: Cardiology

## 2015-06-15 DIAGNOSIS — E785 Hyperlipidemia, unspecified: Secondary | ICD-10-CM | POA: Insufficient documentation

## 2015-06-15 DIAGNOSIS — Z9861 Coronary angioplasty status: Secondary | ICD-10-CM

## 2015-06-15 DIAGNOSIS — Z823 Family history of stroke: Secondary | ICD-10-CM | POA: Diagnosis not present

## 2015-06-15 DIAGNOSIS — T82855A Stenosis of coronary artery stent, initial encounter: Secondary | ICD-10-CM | POA: Diagnosis not present

## 2015-06-15 DIAGNOSIS — Z8249 Family history of ischemic heart disease and other diseases of the circulatory system: Secondary | ICD-10-CM | POA: Diagnosis not present

## 2015-06-15 DIAGNOSIS — E103299 Type 1 diabetes mellitus with mild nonproliferative diabetic retinopathy without macular edema, unspecified eye: Secondary | ICD-10-CM | POA: Insufficient documentation

## 2015-06-15 DIAGNOSIS — Y712 Prosthetic and other implants, materials and accessory cardiovascular devices associated with adverse incidents: Secondary | ICD-10-CM | POA: Diagnosis not present

## 2015-06-15 DIAGNOSIS — Z7982 Long term (current) use of aspirin: Secondary | ICD-10-CM | POA: Diagnosis not present

## 2015-06-15 DIAGNOSIS — Z7902 Long term (current) use of antithrombotics/antiplatelets: Secondary | ICD-10-CM | POA: Diagnosis not present

## 2015-06-15 DIAGNOSIS — E7800 Pure hypercholesterolemia, unspecified: Secondary | ICD-10-CM | POA: Diagnosis not present

## 2015-06-15 DIAGNOSIS — I25118 Atherosclerotic heart disease of native coronary artery with other forms of angina pectoris: Secondary | ICD-10-CM | POA: Diagnosis present

## 2015-06-15 DIAGNOSIS — E1065 Type 1 diabetes mellitus with hyperglycemia: Secondary | ICD-10-CM | POA: Diagnosis not present

## 2015-06-15 DIAGNOSIS — I209 Angina pectoris, unspecified: Secondary | ICD-10-CM

## 2015-06-15 DIAGNOSIS — E1159 Type 2 diabetes mellitus with other circulatory complications: Secondary | ICD-10-CM

## 2015-06-15 DIAGNOSIS — I10 Essential (primary) hypertension: Secondary | ICD-10-CM | POA: Diagnosis not present

## 2015-06-15 HISTORY — PX: CARDIAC CATHETERIZATION: SHX172

## 2015-06-15 LAB — GLUCOSE, CAPILLARY
Glucose-Capillary: 214 mg/dL — ABNORMAL HIGH (ref 65–99)
Glucose-Capillary: 152 mg/dL — ABNORMAL HIGH (ref 65–99)

## 2015-06-15 LAB — BASIC METABOLIC PANEL
Anion gap: 8 (ref 5–15)
BUN: 18 mg/dL (ref 6–20)
CHLORIDE: 105 mmol/L (ref 101–111)
CO2: 24 mmol/L (ref 22–32)
Calcium: 9.1 mg/dL (ref 8.9–10.3)
Creatinine, Ser: 0.87 mg/dL (ref 0.61–1.24)
GFR calc Af Amer: >60 mL/min (ref >60)
GLUCOSE: 195 mg/dL — AB (ref 65–99)
POTASSIUM: 4.8 mmol/L (ref 3.5–5.1)
Sodium: 137 mmol/L (ref 135–145)

## 2015-06-15 LAB — CBC
HCT: 44.4 % (ref 39.0–52.0)
HEMOGLOBIN: 15.2 g/dL (ref 13.0–17.0)
MCH: 31.5 pg (ref 26.0–34.0)
MCHC: 34.2 g/dL (ref 30.0–36.0)
MCV: 92.1 fL (ref 78.0–100.0)
Platelets: 205 10*3/uL (ref 150–400)
RBC: 4.82 MIL/uL (ref 4.22–5.81)
RDW: 12 % (ref 11.5–15.5)
WBC: 6.2 10*3/uL (ref 4.0–10.5)

## 2015-06-15 LAB — LIPID PANEL
CHOL/HDL RATIO: 2.4 ratio
Cholesterol: 131 mg/dL (ref 0–200)
HDL: 55 mg/dL (ref >40)
LDL CALC: 61 mg/dL (ref 0–99)
Triglycerides: 76 mg/dL (ref <150)
VLDL: 15 mg/dL (ref 0–40)

## 2015-06-15 LAB — PROTIME-INR
INR: 1 (ref 0.00–1.49)
Prothrombin Time: 13.4 seconds (ref 11.6–15.2)

## 2015-06-15 LAB — POCT ACTIVATED CLOTTING TIME
ACTIVATED CLOTTING TIME: 265 s
ACTIVATED CLOTTING TIME: 270 s

## 2015-06-15 SURGERY — LEFT HEART CATH AND CORONARY ANGIOGRAPHY

## 2015-06-15 MED ORDER — VERAPAMIL HCL 2.5 MG/ML IV SOLN
INTRA_ARTERIAL | Status: DC | PRN
  Administered 2015-06-15: 5 mL via INTRA_ARTERIAL

## 2015-06-15 MED ORDER — MIDAZOLAM HCL 2 MG/2ML IJ SOLN
INTRAMUSCULAR | Status: DC | PRN
  Administered 2015-06-15: 2 mg via INTRAVENOUS
  Administered 2015-06-15 (×2): 1 mg via INTRAVENOUS

## 2015-06-15 MED ORDER — MIDAZOLAM HCL 2 MG/2ML IJ SOLN
INTRAMUSCULAR | Status: AC
  Filled 2015-06-15: qty 2

## 2015-06-15 MED ORDER — NITROGLYCERIN 1 MG/10 ML FOR IR/CATH LAB
INTRA_ARTERIAL | Status: DC | PRN
  Administered 2015-06-15: 09:00:00

## 2015-06-15 MED ORDER — VERAPAMIL HCL 2.5 MG/ML IV SOLN
INTRAVENOUS | Status: AC
  Filled 2015-06-15: qty 2

## 2015-06-15 MED ORDER — SODIUM CHLORIDE 0.9 % IJ SOLN: 3.0000 mL | INTRAMUSCULAR | Status: DC | PRN

## 2015-06-15 MED ORDER — NITROGLYCERIN 1 MG/10 ML FOR IR/CATH LAB
INTRA_ARTERIAL | Status: DC | PRN
  Administered 2015-06-15: 200 ug

## 2015-06-15 MED ORDER — HYDROMORPHONE HCL 1 MG/ML IJ SOLN
INTRAMUSCULAR | Status: AC
  Filled 2015-06-15: qty 1

## 2015-06-15 MED ORDER — LIDOCAINE HCL (PF) 1 % IJ SOLN
INTRAMUSCULAR | Status: AC
  Filled 2015-06-15: qty 30

## 2015-06-15 MED ORDER — ASPIRIN 81 MG PO CHEW: 81.0000 mg | CHEWABLE_TABLET | Freq: Every day | ORAL | Status: DC

## 2015-06-15 MED ORDER — DIAZEPAM 5 MG PO TABS: 5.0000 mg | ORAL_TABLET | ORAL | Status: DC | PRN

## 2015-06-15 MED ORDER — METOPROLOL TARTRATE 12.5 MG HALF TABLET: 12.5000 mg | ORAL_TABLET | Freq: Two times a day (BID) | ORAL | Status: DC

## 2015-06-15 MED ORDER — HYDROMORPHONE HCL 1 MG/ML IJ SOLN
INTRAMUSCULAR | Status: DC | PRN
  Administered 2015-06-15 (×3): 0.5 mg via INTRAVENOUS

## 2015-06-15 MED ORDER — SODIUM CHLORIDE 0.9 % WEIGHT BASED INFUSION
1.0000 mL/kg/h | INTRAVENOUS | Status: DC
  Administered 2015-06-15: 250 mL/h via INTRAVENOUS
  Administered 2015-06-15: 250 mL via INTRAVENOUS

## 2015-06-15 MED ORDER — ADENOSINE 12 MG/4ML IV SOLN
16.0000 mL | Freq: Once | INTRAVENOUS | Status: AC
  Administered 2015-06-15: 48 mg via INTRAVENOUS
  Filled 2015-06-15: qty 16

## 2015-06-15 MED ORDER — TICAGRELOR 90 MG PO TABS: 90.0000 mg | ORAL_TABLET | Freq: Two times a day (BID) | ORAL | Status: DC

## 2015-06-15 MED ORDER — ONDANSETRON HCL 4 MG/2ML IJ SOLN: 4.0000 mg | Freq: Four times a day (QID) | INTRAMUSCULAR | Status: DC | PRN

## 2015-06-15 MED ORDER — IOHEXOL 350 MG/ML SOLN
INTRAVENOUS | Status: DC | PRN
  Administered 2015-06-15: 135 mL via INTRAVENOUS

## 2015-06-15 MED ORDER — CLOPIDOGREL BISULFATE 75 MG PO TABS: 75.0000 mg | ORAL_TABLET | ORAL | Status: DC

## 2015-06-15 MED ORDER — TICAGRELOR 90 MG PO TABS
ORAL_TABLET | ORAL | Status: DC | PRN
  Administered 2015-06-15: 180 mg via ORAL

## 2015-06-15 MED ORDER — LIDOCAINE HCL (PF) 1 % IJ SOLN
INTRAMUSCULAR | Status: DC | PRN
  Administered 2015-06-15: 2 mL

## 2015-06-15 MED ORDER — NITROGLYCERIN 0.4 MG SL SUBL: 0.4000 mg | SUBLINGUAL_TABLET | SUBLINGUAL | Status: DC | PRN

## 2015-06-15 MED ORDER — LOSARTAN POTASSIUM 25 MG PO TABS: 12.5000 mg | ORAL_TABLET | Freq: Every day | ORAL | Status: DC

## 2015-06-15 MED ORDER — SODIUM CHLORIDE 0.9 % WEIGHT BASED INFUSION: 3.0000 mL/kg/h | INTRAVENOUS | Status: AC

## 2015-06-15 MED ORDER — INSULIN ASPART 100 UNIT/ML ~~LOC~~ SOLN: 4.0000 [IU] | Freq: Three times a day (TID) | SUBCUTANEOUS | Status: DC

## 2015-06-15 MED ORDER — SODIUM CHLORIDE 0.9 % IV SOLN: 250.0000 mL | INTRAVENOUS | Status: DC | PRN

## 2015-06-15 MED ORDER — HEPARIN SODIUM (PORCINE) 1000 UNIT/ML IJ SOLN
INTRAMUSCULAR | Status: DC | PRN
  Administered 2015-06-15: 1500 [IU] via INTRAVENOUS
  Administered 2015-06-15: 6000 [IU] via INTRAVENOUS
  Administered 2015-06-15: 3000 [IU] via INTRAVENOUS

## 2015-06-15 MED ORDER — SODIUM CHLORIDE 0.9 % IJ SOLN: 3.0000 mL | Freq: Two times a day (BID) | INTRAMUSCULAR | Status: DC

## 2015-06-15 MED ORDER — HEPARIN SODIUM (PORCINE) 1000 UNIT/ML IJ SOLN
INTRAMUSCULAR | Status: AC
  Filled 2015-06-15: qty 1

## 2015-06-15 MED ORDER — ACETAMINOPHEN 325 MG PO TABS: 650.0000 mg | ORAL_TABLET | ORAL | Status: DC | PRN

## 2015-06-15 MED ORDER — SODIUM CHLORIDE 0.9 % WEIGHT BASED INFUSION
3.0000 mL/kg/h | INTRAVENOUS | Status: DC
  Administered 2015-06-15: 3 mL/kg/h via INTRAVENOUS

## 2015-06-15 MED ORDER — TICAGRELOR 90 MG PO TABS
ORAL_TABLET | ORAL | Status: AC
  Filled 2015-06-15: qty 2

## 2015-06-15 MED ORDER — NITROGLYCERIN 1 MG/10 ML FOR IR/CATH LAB
INTRA_ARTERIAL | Status: AC
  Filled 2015-06-15: qty 10

## 2015-06-15 MED ORDER — ASPIRIN 81 MG PO TABS: 81.0000 mg | ORAL_TABLET | Freq: Every day | ORAL | Status: AC

## 2015-06-15 MED ORDER — SODIUM CHLORIDE 0.9 % IJ SOLN
3.0000 mL | Freq: Two times a day (BID) | INTRAMUSCULAR | Status: DC
Start: 2015-06-15 — End: 2015-06-15

## 2015-06-15 MED ORDER — HEPARIN (PORCINE) IN NACL 2-0.9 UNIT/ML-% IJ SOLN
INTRAMUSCULAR | Status: AC
  Filled 2015-06-15: qty 1000

## 2015-06-15 MED ORDER — ASPIRIN 81 MG PO CHEW: 81.0000 mg | CHEWABLE_TABLET | ORAL | Status: DC

## 2015-06-15 MED ORDER — HYDROMORPHONE HCL 1 MG/ML IJ SOLN
INTRAMUSCULAR | Status: AC
Start: 2015-06-15 — End: 2015-06-15
  Filled 2015-06-15: qty 1

## 2015-06-15 SURGICAL SUPPLY — 18 items
BALLN ~~LOC~~ EUPHORA RX 2.5X15 (BALLOONS) ×2
BALLN ~~LOC~~ EUPHORA RX 3.25X15 (BALLOONS) ×2
BALLOON ~~LOC~~ EUPHORA RX 2.5X15 (BALLOONS) IMPLANT
BALLOON ~~LOC~~ EUPHORA RX 3.25X15 (BALLOONS) IMPLANT
CATH LAUNCHER 6FR AL1 (CATHETERS) IMPLANT
CATH OPTITORQUE TIG 4.0 5F (CATHETERS) ×2 IMPLANT
CATHETER LAUNCHER 6FR AL1 (CATHETERS) ×2
DEVICE RAD COMP TR BAND LRG (VASCULAR PRODUCTS) ×2 IMPLANT
GLIDESHEATH SLEND A-KIT 6F 20G (SHEATH) ×2 IMPLANT
GUIDEWIRE PRESSURE COMET II (WIRE) ×1 IMPLANT
KIT ESSENTIALS PG (KITS) ×1 IMPLANT
KIT HEART LEFT (KITS) ×2 IMPLANT
PACK CARDIAC CATHETERIZATION (CUSTOM PROCEDURE TRAY) ×2 IMPLANT
STENT RESOLUTE INTEG 3.0X26 (Permanent Stent) ×1 IMPLANT
TRANSDUCER W/STOPCOCK (MISCELLANEOUS) ×2 IMPLANT
TUBING CIL FLEX 10 FLL-RA (TUBING) ×2 IMPLANT
WIRE MAILMAN 182CM (WIRE) ×1 IMPLANT
WIRE SAFE-T 1.5MM-J .035X260CM (WIRE) ×2 IMPLANT

## 2015-06-15 NOTE — Progress Notes (Signed)
TR BAND REMOVED AND SMALL AMOUNT OF SWELLING NOTED AT SITE. THE TR BAND WAS RETURNED AND 6CC AIR PUT BACK INTO BAND.

## 2015-06-15 NOTE — Discharge Summary (Signed)
Physician Discharge Summary  Patient ID: Travis Fletcher MRN: [id number] DOB/AGE: [birth year] 58 y.o.  Admit date: 06/15/2015 Discharge date: 06/15/2015  Primary Discharge Diagnosis CAD native vessel with angina pectoris  Secondary Discharge Diagnosis DM-I uncontrolled Hyperlipidemia Hypertension  Significant Diagnostic Studies: Coronary angiogram 06/15/2015: Findings Hyperdynamic LV, EF 65-70%. No wall motion abnormality. Mildly calcified coronary vessels. Balloon angioplasty site at the PDA performed 07/13/2012 patent with mild to moderate restenosis. Ramus intermediate balloon angioplasty site performed in 2012 widely patent. Mid circumflex 4.0 x 15 mm integrity non-DES stent placed 01/06/2011 widely patent.   Mid LAD 70% calcific stenosis. FFR 0.80. Felt hemodynamically significant clinically. Distal LAD 40% stenosis. Successful FFR guided PTCA and stenting of the mid LAD with implantation of a 3.0 x 26 mm resolute integrity DES. Stent jailed a small D1 which has pre-PTCA stenosis of 80%, had timi 2-3 flow and left alone. Will need Dual antiplatelet therapy with BRILINTA and ASA 81 mg for at least one year, P2Y12 inhibition test has been ordered, if there is adequate elevation as patient previously was on Plavix, I will switch him to Plavix in the outpatient basis.   Hospital Course: admitted on a semi-urgent form, after he was seen in the office with chest pain suggestive intermediate coronary syndrome with angina pectoris occurring daily.  Due to known coronary artery disease he was scheduled for angiography the following morning and underwent successful angioplasty, and fatigue could be discharged home after at least 6-8 hours of observation.  He did well without any recurrence of chest pain, ambulated in the hallway without chest pain, patient requested that he be discharged.   Recommendations on discharge: Will need Dual antiplatelet therapy with BRILINTA and ASA 81 mg for at least one  year, P2Y12 inhibition test has been ordered, if there is adequate elevation as patient previously was on Plavix, I will switch him to Plavix in the outpatient basis.   Discharge Exam: Blood pressure 158/76, pulse 82, temperature 98.3 F (36.8 C), temperature source Oral, resp. rate 14, height 5\' 9"  (1.753 m), weight 90.719 kg (200 lb), SpO2 100 %.  General appearance: alert, cooperative, appears stated age and no distress Resp: clear to auscultation bilaterally Cardio: regular rate and rhythm, S1, S2 normal, no murmur, click, rub or gallop GI: soft, non-tender; bowel sounds normal; no masses,  no organomegaly Extremities: extremities normal, atraumatic, no cyanosis or edema Pulses: 2+ and symmetric Labs:   Lab Results  Component Value Date   WBC 6.2 06/15/2015   HGB 15.2 06/15/2015   HCT 44.4 06/15/2015   MCV 92.1 06/15/2015   PLT 205 06/15/2015    Recent Labs Lab 06/15/15 0700  NA 137  K 4.8  CL 105  CO2 24  BUN 18  CREATININE 0.87  CALCIUM 9.1  GLUCOSE 195*    Lipid Panel     Component Value Date/Time   CHOL 131 06/15/2015 0651   TRIG 76 06/15/2015 0651   HDL 55 06/15/2015 0651   CHOLHDL 2.4 06/15/2015 0651   VLDL 15 06/15/2015 0651   LDLCALC 61 06/15/2015 0651   EKG 06/14/2014: Normal sinus rhythm at rate of 79 bpm, normal intervals.  No evidence of ischemia, normal EKG.  FOLLOW UP PLANS AND APPOINTMENTS Discharge Instructions    AMB Referral to Cardiac Rehabilitation - Phase II    Complete by:  As directed   Diagnosis:  PCI            Medication List    STOP taking these  medications        ALEVE 220 MG tablet  Generic drug:  naproxen sodium     clopidogrel 75 MG tablet  Commonly known as:  PLAVIX      TAKE these medications        aspirin 81 MG tablet  Take 1 tablet (81 mg total) by mouth daily.     insulin aspart 100 UNIT/ML injection  Commonly known as:  novoLOG  Inject into the skin 3 (three) times daily before meals. Has a pump      losartan 25 MG tablet  Commonly known as:  COZAAR  Take 12.5 mg by mouth daily.     metoprolol tartrate 25 MG tablet  Commonly known as:  LOPRESSOR  Take 12.5 mg by mouth 2 (two) times daily.     nitroGLYCERIN 0.4 MG SL tablet  Commonly known as:  NITROSTAT  Place 1 tablet (0.4 mg total) under the tongue every 5 (five) minutes as needed for chest pain.     REPATHA SURECLICK [id number] MG/ML Soaj  Generic drug:  Evolocumab  Inject 140 mg into the muscle every 14 (fourteen) days.     ticagrelor 90 MG Tabs tablet  Commonly known as:  BRILINTA  Take 1 tablet (90 mg total) by mouth 2 (two) times daily.           Follow-up Information    Follow up with Adrian Prows, MD On 06/25/2015.   Specialty:  Cardiology   Why:  Keep the appointment time.   Contact information:   [address] [place] [address] [phone]        Adrian Prows, MD 06/15/2015, 5:01 PM  Pager: [phone] Office: [phone] If no answer: [phone]

## 2015-06-15 NOTE — Progress Notes (Signed)
Pt is on his insulin pump which he maintains and is very knowledgable about. Pt wishes to remain on his pump for his procedure. Rennis Harding and cath lab notified.

## 2015-06-15 NOTE — Interval H&P Note (Signed)
History and Physical Interval Note:  06/15/2015 7:50 AM  Travis Fletcher  has presented today for surgery, with the diagnosis of cp  The various methods of treatment have been discussed with the patient and family. After consideration of risks, benefits and other options for treatment, the patient has consented to  Procedure(s): Left Heart Cath and Coronary Angiography (N/A) and possible PCI as a surgical intervention .  The patient's history has been reviewed, patient examined, no change in status, stable for surgery.  I have reviewed the patient's chart and labs.  Questions were answered to the patient's satisfaction.   Ischemic Symptoms? CCS III (Marked limitation of ordinary activity) Anti-ischemic Medical Therapy? Maximal Medical Therapy (2 or more classes of medications) Non-invasive Test Results? No non-invasive testing performed Prior CABG? No Previous CABG   Patient Information:   1-2V CAD, no prox LAD  A (7)  Indication: 20; Score: 7   Patient Information:   1-2V-CAD with DS 50-60% With No FFR, No IVUS  I (3)  Indication: 21; Score: 3   Patient Information:   1-2V-CAD with DS 50-60% With FFR  A (7)  Indication: 22; Score: 7   Patient Information:   1-2V-CAD with DS 50-60% With FFR>0.8, IVUS not significant  I (2)  Indication: 23; Score: 2   Patient Information:   3V-CAD without LMCA With Abnormal LV systolic function  A (9)  Indication: 48; Score: 9   Patient Information:   LMCA-CAD  A (9)  Indication: 49; Score: 9   Patient Information:   2V-CAD with prox LAD PCI  A (7)  Indication: 62; Score: 7   Patient Information:   2V-CAD with prox LAD CABG  A (8)  Indication: 62; Score: 8   Patient Information:   3V-CAD without LMCA With Low CAD burden(i.e., 3 focal stenoses, low SYNTAX score) PCI  A (7)  Indication: 63; Score: 7   Patient Information:   3V-CAD without LMCA With Low CAD burden(i.e., 3 focal stenoses, low SYNTAX  score) CABG  A (9)  Indication: 63; Score: 9   Patient Information:   3V-CAD without LMCA E06c - Intermediate-high CAD burden (i.e., multiple diffuse lesions, presence of CTO, or high SYNTAX score) PCI  U (4)  Indication: 64; Score: 4   Patient Information:   3V-CAD without LMCA E06c - Intermediate-high CAD burden (i.e., multiple diffuse lesions, presence of CTO, or high SYNTAX score) CABG  A (9)  Indication: 64; Score: 9   Patient Information:   LMCA-CAD With Isolated LMCA stenosis  PCI  U (6)  Indication: 65; Score: 6   Patient Information:   LMCA-CAD With Isolated LMCA stenosis  CABG  A (9)  Indication: 65; Score: 9   Patient Information:   LMCA-CAD Additional CAD, low CAD burden (i.e., 1- to 2-vessel additional involvement, low SYNTAX score) PCI  U (5)  Indication: 66; Score: 5   Patient Information:   LMCA-CAD Additional CAD, low CAD burden (i.e., 1- to 2-vessel additional involvement, low SYNTAX score) CABG  A (9)  Indication: 66; Score: 9   Patient Information:   LMCA-CAD Additional CAD, intermediate-high CAD burden (i.e., 3-vessel involvement, presence of CTO, or high SYNTAX score) PCI  I (3)  Indication: 67; Score: 3   Patient Information:   LMCA-CAD Additional CAD, intermediate-high CAD burden (i.e., 3-vessel involvement, presence of CTO, or high SYNTAX score) CABG  A (9)  Indication: 67; Score: 9   Travis Fletcher

## 2015-06-18 ENCOUNTER — Telehealth (HOSPITAL_COMMUNITY): Payer: Self-pay | Admitting: Nurse Practitioner

## 2015-09-27 DIAGNOSIS — H33103 Unspecified retinoschisis, bilateral: Secondary | ICD-10-CM | POA: Diagnosis not present

## 2015-09-27 DIAGNOSIS — H4312 Vitreous hemorrhage, left eye: Secondary | ICD-10-CM | POA: Diagnosis not present

## 2015-09-27 DIAGNOSIS — Z961 Presence of intraocular lens: Secondary | ICD-10-CM | POA: Diagnosis not present

## 2015-09-27 DIAGNOSIS — E103592 Type 1 diabetes mellitus with proliferative diabetic retinopathy without macular edema, left eye: Secondary | ICD-10-CM | POA: Diagnosis not present

## 2015-10-29 ENCOUNTER — Emergency Department (HOSPITAL_COMMUNITY): Payer: BLUE CROSS/BLUE SHIELD

## 2015-10-29 ENCOUNTER — Emergency Department (HOSPITAL_COMMUNITY)
Admission: EM | Admit: 2015-10-29 | Discharge: 2015-10-29 | Disposition: A | Discharge status: Home / Self Care | Payer: BLUE CROSS/BLUE SHIELD | Attending: Emergency Medicine | Admitting: Emergency Medicine

## 2015-10-29 ENCOUNTER — Encounter (HOSPITAL_COMMUNITY): Payer: Self-pay | Admitting: Emergency Medicine

## 2015-10-29 DIAGNOSIS — Z794 Long term (current) use of insulin: Secondary | ICD-10-CM | POA: Diagnosis not present

## 2015-10-29 DIAGNOSIS — Z8669 Personal history of other diseases of the nervous system and sense organs: Secondary | ICD-10-CM | POA: Diagnosis not present

## 2015-10-29 DIAGNOSIS — E119 Type 2 diabetes mellitus without complications: Secondary | ICD-10-CM | POA: Insufficient documentation

## 2015-10-29 DIAGNOSIS — Z8546 Personal history of malignant neoplasm of prostate: Secondary | ICD-10-CM | POA: Insufficient documentation

## 2015-10-29 DIAGNOSIS — R079 Chest pain, unspecified: Secondary | ICD-10-CM | POA: Diagnosis not present

## 2015-10-29 DIAGNOSIS — R911 Solitary pulmonary nodule: Secondary | ICD-10-CM | POA: Diagnosis not present

## 2015-10-29 DIAGNOSIS — N200 Calculus of kidney: Secondary | ICD-10-CM | POA: Diagnosis not present

## 2015-10-29 DIAGNOSIS — I25119 Atherosclerotic heart disease of native coronary artery with unspecified angina pectoris: Secondary | ICD-10-CM | POA: Diagnosis not present

## 2015-10-29 DIAGNOSIS — Z9889 Other specified postprocedural states: Secondary | ICD-10-CM | POA: Diagnosis not present

## 2015-10-29 DIAGNOSIS — Z7982 Long term (current) use of aspirin: Secondary | ICD-10-CM | POA: Diagnosis not present

## 2015-10-29 DIAGNOSIS — Z79899 Other long term (current) drug therapy: Secondary | ICD-10-CM | POA: Insufficient documentation

## 2015-10-29 DIAGNOSIS — R002 Palpitations: Secondary | ICD-10-CM | POA: Diagnosis not present

## 2015-10-29 DIAGNOSIS — N132 Hydronephrosis with renal and ureteral calculous obstruction: Secondary | ICD-10-CM | POA: Diagnosis not present

## 2015-10-29 DIAGNOSIS — Z7902 Long term (current) use of antithrombotics/antiplatelets: Secondary | ICD-10-CM | POA: Insufficient documentation

## 2015-10-29 DIAGNOSIS — R0789 Other chest pain: Secondary | ICD-10-CM | POA: Diagnosis not present

## 2015-10-29 DIAGNOSIS — R110 Nausea: Secondary | ICD-10-CM | POA: Diagnosis not present

## 2015-10-29 LAB — HEPATIC FUNCTION PANEL
ALK PHOS: 78 U/L (ref 38–126)
ALT: 32 U/L (ref 17–63)
AST: 32 U/L (ref 15–41)
Albumin: 3.7 g/dL (ref 3.5–5.0)
BILIRUBIN DIRECT: 0.3 mg/dL (ref 0.1–0.5)
BILIRUBIN INDIRECT: 0.7 mg/dL (ref 0.3–0.9)
BILIRUBIN TOTAL: 1 mg/dL (ref 0.3–1.2)
TOTAL PROTEIN: 6.7 g/dL (ref 6.5–8.1)

## 2015-10-29 LAB — CBC WITH DIFFERENTIAL/PLATELET
BASOS ABS: 0 10*3/uL (ref 0.0–0.1)
Basophils Relative: 0 %
EOS PCT: 2 %
Eosinophils Absolute: 0.1 10*3/uL (ref 0.0–0.7)
HEMATOCRIT: 43.5 % (ref 39.0–52.0)
Hemoglobin: 14.9 g/dL (ref 13.0–17.0)
LYMPHS PCT: 27 %
Lymphs Abs: 2.4 10*3/uL (ref 0.7–4.0)
MCH: 31.2 pg (ref 26.0–34.0)
MCHC: 34.3 g/dL (ref 30.0–36.0)
MCV: 91 fL (ref 78.0–100.0)
MONO ABS: 0.8 10*3/uL (ref 0.1–1.0)
MONOS PCT: 9 %
Neutro Abs: 5.6 10*3/uL (ref 1.7–7.7)
Neutrophils Relative %: 62 %
PLATELETS: 235 10*3/uL (ref 150–400)
RBC: 4.78 MIL/uL (ref 4.22–5.81)
RDW: 12.3 % (ref 11.5–15.5)
WBC: 9 10*3/uL (ref 4.0–10.5)

## 2015-10-29 LAB — BASIC METABOLIC PANEL
ANION GAP: 7 (ref 5–15)
BUN: 14 mg/dL (ref 6–20)
CALCIUM: 9.6 mg/dL (ref 8.9–10.3)
CO2: 26 mmol/L (ref 22–32)
CREATININE: 0.97 mg/dL (ref 0.61–1.24)
Chloride: 105 mmol/L (ref 101–111)
GFR calc Af Amer: >60 mL/min (ref >60)
GLUCOSE: 135 mg/dL — AB (ref 65–99)
Potassium: 4.8 mmol/L (ref 3.5–5.1)
Sodium: 138 mmol/L (ref 135–145)

## 2015-10-29 LAB — I-STAT TROPONIN, ED: Troponin i, poc: 0 ng/mL (ref 0.00–0.08)

## 2015-10-29 LAB — LIPASE, BLOOD: LIPASE: 16 U/L (ref 11–51)

## 2015-10-29 MED ORDER — IOPAMIDOL (ISOVUE-370) INJECTION 76%
INTRAVENOUS | Status: AC
  Administered 2015-10-29: 100 mL
  Filled 2015-10-29: qty 100

## 2015-10-29 MED ORDER — HYDROCODONE-ACETAMINOPHEN 5-325 MG PO TABS: 1.0000 | ORAL_TABLET | Freq: Four times a day (QID) | ORAL | Status: DC | PRN

## 2015-10-29 MED ORDER — ONDANSETRON HCL 4 MG PO TABS: 4.0000 mg | ORAL_TABLET | Freq: Four times a day (QID) | ORAL | Status: DC

## 2015-10-29 MED ORDER — MORPHINE SULFATE (PF) 4 MG/ML IV SOLN
4.0000 mg | Freq: Once | INTRAVENOUS | Status: AC
  Administered 2015-10-29: 4 mg via INTRAVENOUS
  Filled 2015-10-29: qty 1

## 2015-10-29 MED ORDER — KETOROLAC TROMETHAMINE 30 MG/ML IJ SOLN
30.0000 mg | Freq: Once | INTRAMUSCULAR | Status: AC
  Administered 2015-10-29: 30 mg via INTRAVENOUS
  Filled 2015-10-29: qty 1

## 2015-10-29 MED ORDER — ONDANSETRON HCL 4 MG/2ML IJ SOLN
4.0000 mg | Freq: Once | INTRAMUSCULAR | Status: AC
  Administered 2015-10-29: 4 mg via INTRAVENOUS
  Filled 2015-10-29: qty 2

## 2015-10-29 NOTE — ED Provider Notes (Signed)
CSN: [id number]     Arrival date & time 10/29/15  1243 History   First MD Initiated Contact with Patient 10/29/15 1244     No chief complaint on file.    (Consider location/radiation/quality/duration/timing/severity/associated sxs/prior Treatment) HPI Comments: Patient with PMH of CAD presents to the ED with a chief complaint of central chest pain that radiates to the back.  He states that they symptoms started at around 9:45 this morning after doing tile work.  He states that he took 3 nitroglycerin and was given 324 of aspirin by EMS.  He states that the nitro helps briefly, but the symptoms then return.  He states that the chest and back pain began to radiate to his abdomen.  He denies any numbness, weakness, or tingling.  He denies any associated SOB, but does feel nauseated without vomiting.  There are no other associated symptoms.  The history is provided by the patient. No language interpreter was used.    Past Medical History  Diagnosis Date  . Coronary artery disease   . Anginal pain ([place])   . Neuromuscular disorder ([facility])     neuropathy  . High cholesterol   . Insulin dependent diabetes mellitus ([place])   . Prostate cancer ([place]) 2010  . Arthritis     "hands" (07/11/2014)  . Kidney stones     "passed them all"   Past Surgical History  Procedure Laterality Date  . Thyroidectomy, partial  ~ 1979  . Balloon angioplasty, artery  07/13/2012  . Left heart catheterization with coronary angiogram N/A 07/13/2012    Procedure: LEFT HEART CATHETERIZATION WITH CORONARY ANGIOGRAM;  Surgeon: Laverda Page, MD;  Location: [facility] CATH LAB;  Service: Cardiovascular;  Laterality: N/A;  . Tonsillectomy and adenoidectomy  1977  . Inguinal hernia repair Left 1987  . Retinal detachment surgery Left 2010  . Retinal laser procedure Right 2010  . Eye surgery    . Insertion prostate radiation seed  2010  . Coronary angioplasty  07/11/2014  . Coronary angioplasty with stent placement  2012    "1"  .  Cataract extraction w/ intraocular lens  implant, bilateral Bilateral ~ 2000  . Left heart catheterization with coronary angiogram N/A 07/11/2014    Procedure: LEFT HEART CATHETERIZATION WITH CORONARY ANGIOGRAM;  Surgeon: Laverda Page, MD;  Location: [facility] CATH LAB;  Service: Cardiovascular;  Laterality: N/A;  . Cardiac catheterization N/A 06/15/2015    Procedure: Left Heart Cath and Coronary Angiography;  Surgeon: Adrian Prows, MD;  Location: [facility] LAB;  Service: Cardiovascular;  Laterality: N/A;  . Cardiac catheterization N/A 06/15/2015    Procedure: Coronary Stent Intervention;  Surgeon: Adrian Prows, MD;  Location: [place] [facility] LAB;  Service: Cardiovascular;  Laterality: N/A;   History reviewed. No pertinent family history. Social History  Substance Use Topics  . Smoking status: Never Smoker   . Smokeless tobacco: Never Used  . Alcohol Use: Yes     Comment: 07/11/2014 "0-12 beers/wk; glass of wine when I want; nothing regular"    Review of Systems  Constitutional: Negative for fever and chills.  Respiratory: Negative for shortness of breath.   Cardiovascular: Positive for chest pain.  Gastrointestinal: Positive for abdominal pain. Negative for nausea, vomiting, diarrhea and constipation.  Genitourinary: Positive for flank pain. Negative for dysuria.  All other systems reviewed and are negative.     Allergies  Review of patient's allergies indicates no known allergies.  Home Medications   Prior to Admission medications   Medication  Sig Start Date End Date Taking? Authorizing Provider  aspirin 81 MG tablet Take 1 tablet (81 mg total) by mouth daily. 06/15/15   Adrian Prows, MD  insulin aspart (NOVOLOG) 100 UNIT/ML injection Inject into the skin 3 (three) times daily before meals. Has a pump    Historical Provider, MD  losartan (COZAAR) 25 MG tablet Take 12.5 mg by mouth daily.    Historical Provider, MD  metoprolol tartrate (LOPRESSOR) 25 MG tablet Take 12.5 mg by mouth 2 (two)  times daily.    Historical Provider, MD  nitroGLYCERIN (NITROSTAT) 0.4 MG SL tablet Place 1 tablet (0.4 mg total) under the tongue every 5 (five) minutes as needed for chest pain. 07/14/12   Adrian Prows, MD  REPATHA SURECLICK [id number] MG/ML SOAJ Inject 140 mg into the muscle every 14 (fourteen) days.  06/11/15   Historical Provider, MD  ticagrelor (BRILINTA) 90 MG TABS tablet Take 1 tablet (90 mg total) by mouth 2 (two) times daily. 06/15/15   Adrian Prows, MD   BP 129/71 mmHg  Pulse 74  Temp(Src) 98 F (36.7 C) (Oral)  Resp 20  SpO2 99% Physical Exam  Constitutional: He is oriented to person, place, and time. He appears well-developed and well-nourished.  HENT:  Head: Normocephalic and atraumatic.  Eyes: Conjunctivae and EOM are normal. Pupils are equal, round, and reactive to light. Right eye exhibits no discharge. Left eye exhibits no discharge. No scleral icterus.  Neck: Normal range of motion. Neck supple. No JVD present.  Cardiovascular: Normal rate, regular rhythm and normal heart sounds.  Exam reveals no gallop and no friction rub.   No murmur heard. Pulmonary/Chest: Effort normal and breath sounds normal. No respiratory distress. He has no wheezes. He has no rales. He exhibits no tenderness.  Abdominal: Soft. He exhibits no distension and no mass. There is no tenderness. There is no rebound and no guarding.  Musculoskeletal: Normal range of motion. He exhibits no edema or tenderness.  Neurological: He is alert and oriented to person, place, and time.  Skin: Skin is warm and dry.  Psychiatric: He has a normal mood and affect. His behavior is normal. Judgment and thought content normal.  Nursing note and vitals reviewed.   ED Course  Procedures (including critical care time) Results for orders placed or performed during the hospital encounter of 10/29/15  CBC with Differential/Platelet  Result Value Ref Range   WBC 9.0 4.0 - 10.5 K/uL   RBC 4.78 4.22 - 5.81 MIL/uL   Hemoglobin 14.9 13.0 -  17.0 g/dL   HCT 43.5 39.0 - 52.0 %   MCV 91.0 78.0 - 100.0 fL   MCH 31.2 26.0 - 34.0 pg   MCHC 34.3 30.0 - 36.0 g/dL   RDW 12.3 11.5 - 15.5 %   Platelets 235 150 - 400 K/uL   Neutrophils Relative % 62 %   Neutro Abs 5.6 1.7 - 7.7 K/uL   Lymphocytes Relative 27 %   Lymphs Abs 2.4 0.7 - 4.0 K/uL   Monocytes Relative 9 %   Monocytes Absolute 0.8 0.1 - 1.0 K/uL   Eosinophils Relative 2 %   Eosinophils Absolute 0.1 0.0 - 0.7 K/uL   Basophils Relative 0 %   Basophils Absolute 0.0 0.0 - 0.1 K/uL  Basic metabolic panel  Result Value Ref Range   Sodium 138 135 - 145 mmol/L   Potassium 4.8 3.5 - 5.1 mmol/L   Chloride 105 101 - 111 mmol/L   CO2 26 22 - 32  mmol/L   Glucose, Bld 135 (H) 65 - 99 mg/dL   BUN 14 6 - 20 mg/dL   Creatinine, Ser 0.97 0.61 - 1.24 mg/dL   Calcium 9.6 8.9 - 10.3 mg/dL   GFR calc non Af Amer >60 >60 mL/min   GFR calc Af Amer >60 >60 mL/min   Anion gap 7 5 - 15  Hepatic function panel  Result Value Ref Range   Total Protein 6.7 6.5 - 8.1 g/dL   Albumin 3.7 3.5 - 5.0 g/dL   AST 32 15 - 41 U/L   ALT 32 17 - 63 U/L   Alkaline Phosphatase 78 38 - 126 U/L   Total Bilirubin 1.0 0.3 - 1.2 mg/dL   Bilirubin, Direct 0.3 0.1 - 0.5 mg/dL   Indirect Bilirubin 0.7 0.3 - 0.9 mg/dL  Lipase, blood  Result Value Ref Range   Lipase 16 11 - 51 U/L  I-stat troponin, ED  Result Value Ref Range   Troponin i, poc 0.00 0.00 - 0.08 ng/mL   Comment 3           Dg Chest Port 1 View  10/29/2015  CLINICAL DATA:  Acute chest pain. EXAM: PORTABLE CHEST 1 VIEW COMPARISON:  December 03, 2012. FINDINGS: The heart size and mediastinal contours are within normal limits. Both lungs are clear. No pneumothorax or pleural effusion is noted. The visualized skeletal structures are unremarkable. IMPRESSION: No acute cardiopulmonary abnormality seen. Electronically Signed   By: Marijo Conception, M.D.   On: 10/29/2015 13:51   Ct Angio Chest/abd/pel For Dissection W And/or W/wo  10/29/2015  CLINICAL DATA:   Chest pain started at 11 a.m. this afternoon. Pain radiates to the left lower back and abdomen. History of hypertension. EXAM: CT ANGIOGRAPHY CHEST, ABDOMEN AND PELVIS TECHNIQUE: Multidetector CT imaging through the chest, abdomen and pelvis was performed using the standard protocol during bolus administration of intravenous contrast. Multiplanar reconstructed images and MIPs were obtained and reviewed to evaluate the vascular anatomy. CONTRAST:  100 mL Isovue 370 COMPARISON:  Abdominal CT 02/25/2008 FINDINGS: CTA CHEST FINDINGS Evidence for coronary artery calcifications on the pre contrast images. No evidence for an aortic aneurysm, dissection or intramural hematoma in the thoracic aorta. There is motion artifact affecting the quality of the ascending thoracic aortic images. Great vessels are patent. No suspicious chest lymphadenopathy. The thyroid tissue is asymmetric, left side larger than right. 3 mm nodule or cyst in the left thyroid lobe. Trachea and mainstem bronchi are patent. 4 mm nodule in the right middle lobe on sequence 508, image 68. This area was not imaged on the previous examination. Question a punctate nodule along the left hemidiaphragm on sequence 508, image 99. No significant airspace disease or consolidation in the lungs. Review of the MIP images confirms the above findings. CTA ABDOMEN AND PELVIS FINDINGS Normal caliber of the abdominal aorta without aneurysm or dissection. There is some irregular mural thrombus involving the distal abdominal aorta. There is no significant celiac artery trunk. The splenic artery and common hepatic artery originate from the abdominal aorta and immediately split. The left gastric artery originates from the splenic artery. Superior mesenteric artery is widely patent. Inferior mesenteric artery is widely patent. There is a single left renal artery which is widely patent. Main right renal artery is patent. There are 2 accessory right renal arteries that originate  from the distal abdominal aorta. Normal caliber of the iliac arteries without significant plaque or stenosis. The proximal femoral arteries are patent.  No gross abnormality to the liver or gallbladder. Normal appearance of the pancreas without inflammation or duct dilatation. Normal appearance of spleen without enlargement. Normal adrenal glands. Mild perinephric stranding or edema appears chronic. Normal appearance of the right kidney. There is mild fullness of the left renal pelvis and proximal left ureter. There is a 3 mm stone in the proximal/mid left ureter. Normal appearance of the urinary bladder. Brachytherapy seeds in the prostate. No suspicious lymphadenopathy. No significant free fluid. No free air. No acute bone abnormality. Disc space narrowing at L5-S1. Review of the MIP images confirms the above findings. IMPRESSION: No evidence for an aortic aneurysm, dissection or intramural hematoma. Mild left hydronephrosis due to a 3 mm stone in the proximal/mid left ureter. Indeterminate 4 mm nodule in the right middle lobe. No follow-up needed if patient is low-risk. Non-contrast chest CT can be considered in 12 months if patient is high-risk. This recommendation follows the consensus statement: Guidelines for Management of Incidental Pulmonary Nodules Detected on CT Images:From the Fleischner Society 2017; published online before print (10.1148/radiol.IJ:2314499). Coronary artery calcifications. Electronically Signed   By: Markus Daft M.D.   On: 10/29/2015 15:56    I have personally reviewed and evaluated these images and lab results as part of my medical decision-making.   EKG Interpretation   Date/Time:  Monday Oct 29 2015 12:45:39 EDT Ventricular Rate:  78 PR Interval:  148 QRS Duration: 82 QT Interval:  334 QTC Calculation: 380 R Axis:   54 Text Interpretation:  Normal sinus rhythm Possible Left atrial enlargement  Artifact Abnormal ekg Confirmed by Carmin Muskrat  MD [phone]) on  10/29/2015  1:10:11 PM      MDM   Final diagnoses:  Kidney stone  Lung nodule    Patient with chest pain that radiates to his back, and then began to radiate to his left flank. No shortness of breath. No neurologic deficits. Consider dissection. Will check CT dissection study. Pain controlled with morphine.  Troponin is negative. No leukocytosis. Electrolytes are normal. Patient is afebrile, nontoxic-appearing.  CT scan remarkable for left-sided proximal ureteral stone with some hydronephrosis. Recommend close follow-up with primary care. Return precautions discussed.  Patient discussed with Dr. Vanita Panda, who agrees with the plan.  Discussed with patient that he had incidental finding of lung nodule.  He will discuss this with PCP.  He is a never smoker.    Montine Circle, PA-C 10/29/15 1615  Carmin Muskrat, MD 10/29/15 [phone]

## 2015-10-29 NOTE — ED Notes (Signed)
Pt to ER via [facility] with complaint of central chest pressure onset after doing tile work this morning at 09:45. Pt took 3 of his own nitroglycerin prior to EMS arrival as well as 324 mg of aspirin. Pt was given an additional nitro by EMS. Pt reports nitro temporarily relieves the pain but it returns shortly after. On arrival to ER patient is chest pain free but now reports back pain and abdomen is tender on palpation. Pt has significant cardiac hx. Pt VSS at current time. Pt is diaphoretic on arrival and nauseated.

## 2015-10-29 NOTE — Discharge Instructions (Signed)
You have been diagnosed with a kidney stone.  Please take pain medications as directed.  Please strain you urine and take the stone to your primary care doctor.  Return to the ER for worsening symptoms.  If you develop any additional chest pain return to the ER for re-evaluation.  Kidney Stones Kidney stones (urolithiasis) are deposits that form inside your kidneys. The intense pain is caused by the stone moving through the urinary tract. When the stone moves, the ureter goes into spasm around the stone. The stone is usually passed in the urine.  CAUSES   A disorder that makes certain neck glands produce too much parathyroid hormone (primary hyperparathyroidism).  A buildup of uric acid crystals, similar to gout in your joints.  Narrowing (stricture) of the ureter.  A kidney obstruction present at birth (congenital obstruction).  Previous surgery on the kidney or ureters.  Numerous kidney infections. SYMPTOMS   Feeling sick to your stomach (nauseous).  Throwing up (vomiting).  Blood in the urine (hematuria).  Pain that usually spreads (radiates) to the groin.  Frequency or urgency of urination. DIAGNOSIS   Taking a history and physical exam.  Blood or urine tests.  CT scan.  Occasionally, an examination of the inside of the urinary bladder (cystoscopy) is performed. TREATMENT   Observation.  Increasing your fluid intake.  Extracorporeal shock wave lithotripsy--This is a noninvasive procedure that uses shock waves to break up kidney stones.  Surgery may be needed if you have severe pain or persistent obstruction. There are various surgical procedures. Most of the procedures are performed with the use of small instruments. Only small incisions are needed to accommodate these instruments, so recovery time is minimized. The size, location, and chemical composition are all important variables that will determine the proper choice of action for you. Talk to your health care  provider to better understand your situation so that you will minimize the risk of injury to yourself and your kidney.  HOME CARE INSTRUCTIONS   Drink enough water and fluids to keep your urine clear or pale yellow. This will help you to pass the stone or stone fragments.  Strain all urine through the provided strainer. Keep all particulate matter and stones for your health care provider to see. The stone causing the pain may be as small as a grain of salt. It is very important to use the strainer each and every time you pass your urine. The collection of your stone will allow your health care provider to analyze it and verify that a stone has actually passed. The stone analysis will often identify what you can do to reduce the incidence of recurrences.  Only take over-the-counter or prescription medicines for pain, discomfort, or fever as directed by your health care provider.  Keep all follow-up visits as told by your health care provider. This is important.  Get follow-up X-rays if required. The absence of pain does not always mean that the stone has passed. It may have only stopped moving. If the urine remains completely obstructed, it can cause loss of kidney function or even complete destruction of the kidney. It is your responsibility to make sure X-rays and follow-ups are completed. Ultrasounds of the kidney can show blockages and the status of the kidney. Ultrasounds are not associated with any radiation and can be performed easily in a matter of minutes.  Make changes to your daily diet as told by your health care provider. You may be told to:  Limit the amount  of salt that you eat.  Eat 5 or more servings of fruits and vegetables each day.  Limit the amount of meat, poultry, fish, and eggs that you eat.  Collect a 24-hour urine sample as told by your health care provider.You may need to collect another urine sample every 6-12 months. SEEK MEDICAL CARE IF:  You experience pain that  is progressive and unresponsive to any pain medicine you have been prescribed. SEEK IMMEDIATE MEDICAL CARE IF:   Pain cannot be controlled with the prescribed medicine.  You have a fever or shaking chills.  The severity or intensity of pain increases over 18 hours and is not relieved by pain medicine.  You develop a new onset of abdominal pain.  You feel faint or pass out.  You are unable to urinate.   This information is not intended to replace advice given to you by your health care provider. Make sure you discuss any questions you have with your health care provider.   Document Released: 05/26/2005 Document Revised: 02/14/2015 Document Reviewed: 10/27/2012 Elsevier Interactive Patient Education [employer].

## 2015-10-31 DIAGNOSIS — R911 Solitary pulmonary nodule: Secondary | ICD-10-CM | POA: Diagnosis not present

## 2015-10-31 DIAGNOSIS — N200 Calculus of kidney: Secondary | ICD-10-CM | POA: Diagnosis not present

## 2015-11-13 DIAGNOSIS — N200 Calculus of kidney: Secondary | ICD-10-CM | POA: Diagnosis not present

## 2015-11-21 DIAGNOSIS — H33103 Unspecified retinoschisis, bilateral: Secondary | ICD-10-CM | POA: Diagnosis not present

## 2015-11-21 DIAGNOSIS — H4312 Vitreous hemorrhage, left eye: Secondary | ICD-10-CM | POA: Diagnosis not present

## 2015-11-21 DIAGNOSIS — E103592 Type 1 diabetes mellitus with proliferative diabetic retinopathy without macular edema, left eye: Secondary | ICD-10-CM | POA: Diagnosis not present

## 2015-11-21 DIAGNOSIS — Z961 Presence of intraocular lens: Secondary | ICD-10-CM | POA: Diagnosis not present

## 2015-12-21 DIAGNOSIS — E11311 Type 2 diabetes mellitus with unspecified diabetic retinopathy with macular edema: Secondary | ICD-10-CM | POA: Diagnosis not present

## 2015-12-21 DIAGNOSIS — I10 Essential (primary) hypertension: Secondary | ICD-10-CM | POA: Diagnosis not present

## 2015-12-21 DIAGNOSIS — Z0000 Encounter for general adult medical examination without abnormal findings: Secondary | ICD-10-CM | POA: Diagnosis not present

## 2015-12-21 DIAGNOSIS — E7800 Pure hypercholesterolemia, unspecified: Secondary | ICD-10-CM | POA: Diagnosis not present

## 2015-12-27 DIAGNOSIS — I25119 Atherosclerotic heart disease of native coronary artery with unspecified angina pectoris: Secondary | ICD-10-CM | POA: Diagnosis not present

## 2015-12-27 DIAGNOSIS — E1065 Type 1 diabetes mellitus with hyperglycemia: Secondary | ICD-10-CM | POA: Diagnosis not present

## 2015-12-27 DIAGNOSIS — E7800 Pure hypercholesterolemia, unspecified: Secondary | ICD-10-CM | POA: Diagnosis not present

## 2015-12-27 DIAGNOSIS — I10 Essential (primary) hypertension: Secondary | ICD-10-CM | POA: Diagnosis not present

## 2015-12-31 DIAGNOSIS — E108 Type 1 diabetes mellitus with unspecified complications: Secondary | ICD-10-CM | POA: Diagnosis not present

## 2015-12-31 DIAGNOSIS — E109 Type 1 diabetes mellitus without complications: Secondary | ICD-10-CM | POA: Diagnosis not present

## 2016-01-31 DIAGNOSIS — H4312 Vitreous hemorrhage, left eye: Secondary | ICD-10-CM | POA: Diagnosis not present

## 2016-01-31 DIAGNOSIS — Z961 Presence of intraocular lens: Secondary | ICD-10-CM | POA: Diagnosis not present

## 2016-01-31 DIAGNOSIS — E113592 Type 2 diabetes mellitus with proliferative diabetic retinopathy without macular edema, left eye: Secondary | ICD-10-CM | POA: Diagnosis not present

## 2016-01-31 DIAGNOSIS — E103592 Type 1 diabetes mellitus with proliferative diabetic retinopathy without macular edema, left eye: Secondary | ICD-10-CM | POA: Diagnosis not present

## 2016-03-28 DIAGNOSIS — E1065 Type 1 diabetes mellitus with hyperglycemia: Secondary | ICD-10-CM | POA: Diagnosis not present

## 2016-03-28 DIAGNOSIS — Z794 Long term (current) use of insulin: Secondary | ICD-10-CM | POA: Diagnosis not present

## 2016-03-28 DIAGNOSIS — Z23 Encounter for immunization: Secondary | ICD-10-CM | POA: Diagnosis not present

## 2016-04-01 DIAGNOSIS — Z9641 Presence of insulin pump (external) (internal): Secondary | ICD-10-CM | POA: Diagnosis not present

## 2016-04-01 DIAGNOSIS — Z794 Long term (current) use of insulin: Secondary | ICD-10-CM | POA: Diagnosis not present

## 2016-04-01 DIAGNOSIS — E108 Type 1 diabetes mellitus with unspecified complications: Secondary | ICD-10-CM | POA: Diagnosis not present

## 2016-04-03 DIAGNOSIS — L821 Other seborrheic keratosis: Secondary | ICD-10-CM | POA: Diagnosis not present

## 2016-04-03 DIAGNOSIS — L570 Actinic keratosis: Secondary | ICD-10-CM | POA: Diagnosis not present

## 2016-04-03 DIAGNOSIS — L578 Other skin changes due to chronic exposure to nonionizing radiation: Secondary | ICD-10-CM | POA: Diagnosis not present

## 2016-05-06 DIAGNOSIS — E103592 Type 1 diabetes mellitus with proliferative diabetic retinopathy without macular edema, left eye: Secondary | ICD-10-CM | POA: Diagnosis not present

## 2016-05-06 DIAGNOSIS — H4312 Vitreous hemorrhage, left eye: Secondary | ICD-10-CM | POA: Diagnosis not present

## 2016-05-06 DIAGNOSIS — H33103 Unspecified retinoschisis, bilateral: Secondary | ICD-10-CM | POA: Diagnosis not present

## 2016-05-06 DIAGNOSIS — E113291 Type 2 diabetes mellitus with mild nonproliferative diabetic retinopathy without macular edema, right eye: Secondary | ICD-10-CM | POA: Diagnosis not present

## 2016-05-12 DIAGNOSIS — E7800 Pure hypercholesterolemia, unspecified: Secondary | ICD-10-CM | POA: Diagnosis not present

## 2016-05-30 DIAGNOSIS — Z794 Long term (current) use of insulin: Secondary | ICD-10-CM | POA: Diagnosis not present

## 2016-05-30 DIAGNOSIS — Z23 Encounter for immunization: Secondary | ICD-10-CM | POA: Diagnosis not present

## 2016-05-30 DIAGNOSIS — E1065 Type 1 diabetes mellitus with hyperglycemia: Secondary | ICD-10-CM | POA: Diagnosis not present

## 2016-06-06 DIAGNOSIS — I209 Angina pectoris, unspecified: Secondary | ICD-10-CM | POA: Diagnosis not present

## 2016-06-06 DIAGNOSIS — E1065 Type 1 diabetes mellitus with hyperglycemia: Secondary | ICD-10-CM | POA: Diagnosis not present

## 2016-06-06 DIAGNOSIS — E7800 Pure hypercholesterolemia, unspecified: Secondary | ICD-10-CM | POA: Diagnosis not present

## 2016-06-06 DIAGNOSIS — I2510 Atherosclerotic heart disease of native coronary artery without angina pectoris: Secondary | ICD-10-CM | POA: Diagnosis not present

## 2016-06-11 DIAGNOSIS — M5412 Radiculopathy, cervical region: Secondary | ICD-10-CM | POA: Diagnosis not present

## 2016-06-17 DIAGNOSIS — M542 Cervicalgia: Secondary | ICD-10-CM | POA: Diagnosis not present

## 2016-06-17 DIAGNOSIS — M79601 Pain in right arm: Secondary | ICD-10-CM | POA: Diagnosis not present

## 2016-06-17 DIAGNOSIS — M5412 Radiculopathy, cervical region: Secondary | ICD-10-CM | POA: Diagnosis not present

## 2016-06-20 DIAGNOSIS — M79601 Pain in right arm: Secondary | ICD-10-CM | POA: Diagnosis not present

## 2016-06-20 DIAGNOSIS — M542 Cervicalgia: Secondary | ICD-10-CM | POA: Diagnosis not present

## 2016-06-20 DIAGNOSIS — M5412 Radiculopathy, cervical region: Secondary | ICD-10-CM | POA: Diagnosis not present

## 2016-06-24 DIAGNOSIS — M5412 Radiculopathy, cervical region: Secondary | ICD-10-CM | POA: Diagnosis not present

## 2016-06-24 DIAGNOSIS — M542 Cervicalgia: Secondary | ICD-10-CM | POA: Diagnosis not present

## 2016-06-24 DIAGNOSIS — M79601 Pain in right arm: Secondary | ICD-10-CM | POA: Diagnosis not present

## 2016-06-25 ENCOUNTER — Ambulatory Visit: Payer: BLUE CROSS/BLUE SHIELD | Admitting: *Deleted

## 2016-06-27 DIAGNOSIS — M79601 Pain in right arm: Secondary | ICD-10-CM | POA: Diagnosis not present

## 2016-06-27 DIAGNOSIS — M542 Cervicalgia: Secondary | ICD-10-CM | POA: Diagnosis not present

## 2016-06-27 DIAGNOSIS — M5412 Radiculopathy, cervical region: Secondary | ICD-10-CM | POA: Diagnosis not present

## 2016-06-30 DIAGNOSIS — M542 Cervicalgia: Secondary | ICD-10-CM | POA: Diagnosis not present

## 2016-06-30 DIAGNOSIS — M5412 Radiculopathy, cervical region: Secondary | ICD-10-CM | POA: Diagnosis not present

## 2016-06-30 DIAGNOSIS — M79601 Pain in right arm: Secondary | ICD-10-CM | POA: Diagnosis not present

## 2016-07-03 DIAGNOSIS — M542 Cervicalgia: Secondary | ICD-10-CM | POA: Diagnosis not present

## 2016-07-03 DIAGNOSIS — M5412 Radiculopathy, cervical region: Secondary | ICD-10-CM | POA: Diagnosis not present

## 2016-07-03 DIAGNOSIS — M79601 Pain in right arm: Secondary | ICD-10-CM | POA: Diagnosis not present

## 2016-07-07 DIAGNOSIS — M542 Cervicalgia: Secondary | ICD-10-CM | POA: Diagnosis not present

## 2016-07-07 DIAGNOSIS — M5412 Radiculopathy, cervical region: Secondary | ICD-10-CM | POA: Diagnosis not present

## 2016-07-07 DIAGNOSIS — M79601 Pain in right arm: Secondary | ICD-10-CM | POA: Diagnosis not present

## 2016-07-09 ENCOUNTER — Encounter: Payer: BLUE CROSS/BLUE SHIELD | Attending: Internal Medicine | Admitting: *Deleted

## 2016-07-09 DIAGNOSIS — E109 Type 1 diabetes mellitus without complications: Secondary | ICD-10-CM | POA: Diagnosis not present

## 2016-07-09 DIAGNOSIS — Z713 Dietary counseling and surveillance: Secondary | ICD-10-CM | POA: Diagnosis not present

## 2016-07-09 NOTE — Progress Notes (Signed)
Diabetes Self-Management Education  Visit Type: First/Initial  Appt. Start Time: 0830 Appt. End Time: 0900  07/09/2016  Mr. Travis Fletcher, identified by name and date of birth, is a 61 y.o. male with a diagnosis of Diabetes: Type 1. He has had DM 1 since 1983, is wearing Medtronic Revel insulin pump and is comfortable with carb counting. He is interested in flash CGM to replace his finger sticks. He states he has awareness of hypoglycemia so alerts are not needed. 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   ASSESSMENT  Height 5\' 10"  (1.778 m), weight 197 lb 11.2 oz (89.7 kg). Body mass index is 28.37 kg/m.      Diabetes Self-Management Education - 07/09/16 0834      Visit Information   Visit Type First/Initial     Initial Visit   Diabetes Type Type 1   Are you currently following a meal plan? Yes   What type of meal plan do you follow? count carbs   Are you taking your medications as prescribed? Yes   Date Diagnosed [place]   How would you rate your overall health? Good     Psychosocial Assessment   Patient Belief/Attitude about Diabetes Defeat/Burnout   Other persons present Patient   Patient Concerns Monitoring  CGM    Special Needs None   What is the last grade level you completed in school? 12     Pre-Education Assessment   Patient understands incorporating nutritional management into lifestyle. Demonstrates understanding / competency  patient states he is very comfortable with carb counting, but he forgets to bolus occaisonally   Patient understands using medications safely. Needs Review   Patient understands monitoring blood glucose, interpreting and using results Needs Review     Complications   Last HgB A1C per patient/outside source 8.2 %   How often do you check your blood sugar? 3-4 times/day   Have you had a dilated eye exam in the past 12 months? Yes   Have you had a dental exam in the past 12 months? No   Are you checking your feet? Yes   How many days per week are you checking your feet? 7     Exercise   Exercise Type ADL's     Patient Education   Previous Diabetes Education Yes (please comment)   Nutrition management  Other (comment)  set up Missed Bolus Reminder" on his pump for lunch meal   Monitoring Other (comment)  [place] to check on cost of their system   Psychosocial adjustment Other (comment)  informed him of DM 1 / Pump support group     Individualized Goals (developed by patient)   Monitoring  Other (comment)  contact [employer] to get pricing on [place] with your insurance coverage     Outcomes   Expected Outcomes Demonstrated interest in learning. Expect positive outcomes   Future DMSE PRN   Program Status Completed      Individualized Plan for Diabetes Self-Management Training:   Learning Objective:  Patient will have a greater understanding of diabetes self-management. Patient education plan is to attend individual and/or group sessions per assessed needs and concerns.   Plan:   Patient Instructions  Plan: We turned on the Missed Meal Bolus Reminder under Bolus menu for your lunch meal so if you don't bolus by 1 PM you will get a reminder alert. Contact [employer] to check on your cost for [employer]  CGM system Let me know if you have any questions or concerns in the future.   Expected Outcomes:  Demonstrated interest in learning. Expect positive outcomes  Education material provided: Development worker, community provided along with phone # for [place], which is where he gets his meter and pump supplies. He is not interested in the Support Group at this time.  If problems or questions, patient to contact team via:  Phone and Email  Future DSME appointment: PRN

## 2016-07-09 NOTE — Patient Instructions (Signed)
Plan: We turned on the Missed Meal Bolus Reminder under Bolus menu for your lunch meal so if you don't bolus by 1 PM you will get a reminder alert. Contact [employer] to check on your cost for [place] flash CGM system Let me know if you have any questions or concerns in the future.

## 2016-07-11 DIAGNOSIS — M5412 Radiculopathy, cervical region: Secondary | ICD-10-CM | POA: Diagnosis not present

## 2016-07-11 DIAGNOSIS — M79601 Pain in right arm: Secondary | ICD-10-CM | POA: Diagnosis not present

## 2016-07-11 DIAGNOSIS — M542 Cervicalgia: Secondary | ICD-10-CM | POA: Diagnosis not present

## 2016-07-16 DIAGNOSIS — M5412 Radiculopathy, cervical region: Secondary | ICD-10-CM | POA: Diagnosis not present

## 2016-07-16 DIAGNOSIS — M542 Cervicalgia: Secondary | ICD-10-CM | POA: Diagnosis not present

## 2016-07-16 DIAGNOSIS — M79601 Pain in right arm: Secondary | ICD-10-CM | POA: Diagnosis not present

## 2016-07-29 DIAGNOSIS — L814 Other melanin hyperpigmentation: Secondary | ICD-10-CM | POA: Diagnosis not present

## 2016-07-29 DIAGNOSIS — Z85828 Personal history of other malignant neoplasm of skin: Secondary | ICD-10-CM | POA: Diagnosis not present

## 2016-07-29 DIAGNOSIS — L821 Other seborrheic keratosis: Secondary | ICD-10-CM | POA: Diagnosis not present

## 2016-07-29 DIAGNOSIS — D692 Other nonthrombocytopenic purpura: Secondary | ICD-10-CM | POA: Diagnosis not present

## 2016-07-29 DIAGNOSIS — L570 Actinic keratosis: Secondary | ICD-10-CM | POA: Diagnosis not present

## 2016-09-12 ENCOUNTER — Other Ambulatory Visit: Payer: Self-pay | Admitting: Internal Medicine

## 2016-09-12 ENCOUNTER — Ambulatory Visit
Admission: RE | Admit: 2016-09-12 | Discharge: 2016-09-12 | Disposition: A | Discharge status: Home / Self Care | Payer: BLUE CROSS/BLUE SHIELD | Source: Ambulatory Visit | Attending: Internal Medicine | Admitting: Internal Medicine

## 2016-09-12 DIAGNOSIS — Z794 Long term (current) use of insulin: Secondary | ICD-10-CM | POA: Diagnosis not present

## 2016-09-12 DIAGNOSIS — R0602 Shortness of breath: Secondary | ICD-10-CM | POA: Diagnosis not present

## 2016-09-12 DIAGNOSIS — E1065 Type 1 diabetes mellitus with hyperglycemia: Secondary | ICD-10-CM | POA: Diagnosis not present

## 2016-09-12 DIAGNOSIS — R05 Cough: Secondary | ICD-10-CM | POA: Diagnosis not present

## 2016-10-24 DIAGNOSIS — E108 Type 1 diabetes mellitus with unspecified complications: Secondary | ICD-10-CM | POA: Diagnosis not present

## 2016-10-31 DIAGNOSIS — N5201 Erectile dysfunction due to arterial insufficiency: Secondary | ICD-10-CM | POA: Diagnosis not present

## 2016-10-31 DIAGNOSIS — C61 Malignant neoplasm of prostate: Secondary | ICD-10-CM | POA: Diagnosis not present

## 2016-11-14 ENCOUNTER — Other Ambulatory Visit: Payer: Self-pay | Admitting: Family Medicine

## 2016-11-14 DIAGNOSIS — R911 Solitary pulmonary nodule: Secondary | ICD-10-CM

## 2016-11-19 ENCOUNTER — Other Ambulatory Visit: Payer: BLUE CROSS/BLUE SHIELD

## 2016-12-03 DIAGNOSIS — E108 Type 1 diabetes mellitus with unspecified complications: Secondary | ICD-10-CM | POA: Diagnosis not present

## 2016-12-03 DIAGNOSIS — Z794 Long term (current) use of insulin: Secondary | ICD-10-CM | POA: Diagnosis not present

## 2016-12-03 DIAGNOSIS — Z9641 Presence of insulin pump (external) (internal): Secondary | ICD-10-CM | POA: Diagnosis not present

## 2016-12-22 DIAGNOSIS — I10 Essential (primary) hypertension: Secondary | ICD-10-CM | POA: Diagnosis not present

## 2016-12-22 DIAGNOSIS — Z0000 Encounter for general adult medical examination without abnormal findings: Secondary | ICD-10-CM | POA: Diagnosis not present

## 2016-12-22 DIAGNOSIS — E1165 Type 2 diabetes mellitus with hyperglycemia: Secondary | ICD-10-CM | POA: Diagnosis not present

## 2016-12-22 DIAGNOSIS — E7800 Pure hypercholesterolemia, unspecified: Secondary | ICD-10-CM | POA: Diagnosis not present

## 2016-12-22 DIAGNOSIS — Z794 Long term (current) use of insulin: Secondary | ICD-10-CM | POA: Diagnosis not present

## 2016-12-23 DIAGNOSIS — J029 Acute pharyngitis, unspecified: Secondary | ICD-10-CM | POA: Diagnosis not present

## 2017-01-19 DIAGNOSIS — Z9641 Presence of insulin pump (external) (internal): Secondary | ICD-10-CM | POA: Diagnosis not present

## 2017-01-19 DIAGNOSIS — E108 Type 1 diabetes mellitus with unspecified complications: Secondary | ICD-10-CM | POA: Diagnosis not present

## 2017-01-19 DIAGNOSIS — Z794 Long term (current) use of insulin: Secondary | ICD-10-CM | POA: Diagnosis not present

## 2017-02-04 DIAGNOSIS — E1065 Type 1 diabetes mellitus with hyperglycemia: Secondary | ICD-10-CM | POA: Diagnosis not present

## 2017-02-04 DIAGNOSIS — Z794 Long term (current) use of insulin: Secondary | ICD-10-CM | POA: Diagnosis not present

## 2017-02-12 DIAGNOSIS — E1065 Type 1 diabetes mellitus with hyperglycemia: Secondary | ICD-10-CM | POA: Diagnosis not present

## 2017-02-12 DIAGNOSIS — I10 Essential (primary) hypertension: Secondary | ICD-10-CM | POA: Diagnosis not present

## 2017-02-12 DIAGNOSIS — E7800 Pure hypercholesterolemia, unspecified: Secondary | ICD-10-CM | POA: Diagnosis not present

## 2017-02-12 DIAGNOSIS — I2510 Atherosclerotic heart disease of native coronary artery without angina pectoris: Secondary | ICD-10-CM | POA: Diagnosis not present

## 2017-03-11 DIAGNOSIS — E108 Type 1 diabetes mellitus with unspecified complications: Secondary | ICD-10-CM | POA: Diagnosis not present

## 2017-03-11 DIAGNOSIS — Z9641 Presence of insulin pump (external) (internal): Secondary | ICD-10-CM | POA: Diagnosis not present

## 2017-03-11 DIAGNOSIS — Z794 Long term (current) use of insulin: Secondary | ICD-10-CM | POA: Diagnosis not present

## 2017-04-23 DIAGNOSIS — E108 Type 1 diabetes mellitus with unspecified complications: Secondary | ICD-10-CM | POA: Diagnosis not present

## 2017-04-23 DIAGNOSIS — Z9641 Presence of insulin pump (external) (internal): Secondary | ICD-10-CM | POA: Diagnosis not present

## 2017-04-23 DIAGNOSIS — Z794 Long term (current) use of insulin: Secondary | ICD-10-CM | POA: Diagnosis not present

## 2017-05-05 DIAGNOSIS — H33103 Unspecified retinoschisis, bilateral: Secondary | ICD-10-CM | POA: Diagnosis not present

## 2017-05-05 DIAGNOSIS — E103592 Type 1 diabetes mellitus with proliferative diabetic retinopathy without macular edema, left eye: Secondary | ICD-10-CM | POA: Diagnosis not present

## 2017-05-05 DIAGNOSIS — E113291 Type 2 diabetes mellitus with mild nonproliferative diabetic retinopathy without macular edema, right eye: Secondary | ICD-10-CM | POA: Diagnosis not present

## 2017-05-05 DIAGNOSIS — Z961 Presence of intraocular lens: Secondary | ICD-10-CM | POA: Diagnosis not present

## 2017-05-12 DIAGNOSIS — Z794 Long term (current) use of insulin: Secondary | ICD-10-CM | POA: Diagnosis not present

## 2017-05-12 DIAGNOSIS — E1065 Type 1 diabetes mellitus with hyperglycemia: Secondary | ICD-10-CM | POA: Diagnosis not present

## 2017-06-01 DIAGNOSIS — Z9641 Presence of insulin pump (external) (internal): Secondary | ICD-10-CM | POA: Diagnosis not present

## 2017-06-01 DIAGNOSIS — E108 Type 1 diabetes mellitus with unspecified complications: Secondary | ICD-10-CM | POA: Diagnosis not present

## 2017-06-01 DIAGNOSIS — Z794 Long term (current) use of insulin: Secondary | ICD-10-CM | POA: Diagnosis not present

## 2017-06-24 DIAGNOSIS — E1065 Type 1 diabetes mellitus with hyperglycemia: Secondary | ICD-10-CM | POA: Diagnosis not present

## 2017-06-24 DIAGNOSIS — I209 Angina pectoris, unspecified: Secondary | ICD-10-CM | POA: Diagnosis not present

## 2017-06-24 DIAGNOSIS — E103299 Type 1 diabetes mellitus with mild nonproliferative diabetic retinopathy without macular edema, unspecified eye: Secondary | ICD-10-CM | POA: Diagnosis not present

## 2017-06-24 DIAGNOSIS — I2510 Atherosclerotic heart disease of native coronary artery without angina pectoris: Secondary | ICD-10-CM | POA: Diagnosis not present

## 2017-06-25 NOTE — H&P (Signed)
OFFICE VISIT NOTES COPIED TO EPIC FOR DOCUMENTATION  . History of Present Illness Travis Page MD; 06/25/2017 6:43 AM) Patient words: Last OV 02/12/2017; FU for cp.  The patient is a 62 year old male who presents for a Follow-up for Angina.  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 History of known angioplasty to ramus intermediate on 07/11/2014 and mid circumflex stent with a 4.0 x 15 mm integrity non-DES stent in 2012. He been doing well until 4 days ago started having frequent episodes of chest tightness, he has taken one sublingual nitroglycerin with immediate relief. However since past 4 days, even minimal activities brings on chest tightness with radiation to his neck associated with shortness of breath. Due to symptoms, made appointment to see as an urgent basis. No rest pain, no PND or orthopnea.  He has a history of hyperlipidemia with statin intolerance now on Repatha, uncontrolled type I diabetes follows Dr Travis Fletcher since Oct 2017.   Problem List/Past Medical (Travis Fletcher; 07/02/2017 3:49 PM) Other nonspecific abnormal cardiovascular system function study (794.39)  History of kidney stones (I33.825) [11/2015]: Last stone passed June 2017 Atherosclerosis of native coronary artery of native heart without angina pectoris (I25.10)  Coronary angiogram 06/15/2015: Hyperdynamic LV, EF 65-70%. Mildly calcified coronary vessels. Stenting to Mid LAD with 3.0x22 Resolute DES. Balloon angioplasty site at PDA performed 07/13/2012 & Ramus intermediate balloon angioplasty site performed in 2012 widely patent. Mid circumflex 4.0 x 15 mm integrity non-DES stent placed 01/06/2011 widely patent. Exercise sestamibi: 04/11/11: Positive EKG at 8 min of exercise. No chest pain. Normal perfusion and EF  and normal wall motion. Hypercholesteremia (E78.00)  Uncontrolled type 1 diabetes mellitus with mild nonproliferative retinopathy without macular edema, with long-term current use of insulin (E10.3299, E10.65)  HbA1c 7.9 on 10/24/2014. Follows Dr. Chalmers Cater. H/o Vitreous bleed 12/16/2014 Family history of ischemic heart disease (Z82.49)  Postsurgical percutaneous transluminal coronary angioplasty status (Z98.61)  Coronary angiogram 06/15/2015: Findings Hyperdynamic LV, EF 65-70%. No wall motion abnormality. Mildly calcified coronary vessels. Balloon angioplasty of diffusely diseased PDA performed 07/13/2012 patent. Ramus intermediate balloon angioplasty site performed in 2012 widely patent. Mid circumflex 4.0 x 15 mm integrity non-DES stent placed 01/06/2011 widely patent. Long term use of drug (Z79.899)  Statin intolerance (Z78.9)  Malaise and fatigue (R53.81, R53.83)  Labwork  12/22/2016: Hemoglobin A1c 8.6%. Creatinine 0.9, potassium 4.6, CMP otherwise normal. Cholesterol 130, HDL 52, triglycerides 150, LDL 48. Labs 05/12/2016: Total cholesterol 136, triglycerides 85, HDL 63, LDL 56. Labs 12/23/10 TC 248, TG 99, HDL 65, LDL 163.  Allergies (Travis Fletcher; 2017/07/02 3:49 PM) Travis Fletcher *ANTIHYPERLIPIDEMICS*  Cough. Statins Depletion *DIETARY PRODUCTS/DIETARY MANAGEMENT PRODUCTS*  joint pain, muscle aches  Family History (Travis Fletcher; 07-02-2017 3:49 PM) Mother  Deceased. at age 17, died in her sleep. Hx of Stroke at age 36, Atrial Fibrillation Father  Deceased. at age 55, died in his sleep. Hx Atrial Fibrillation. Sister 3  Sister 101- 75 years older Sister 66- 21 years older Sister 14- 6 years older  Social History (Travis Fletcher; 02-Jul-2017 3:49 PM) Current tobacco use  Never smoker. Alcohol Use  Occasional alcohol use. Marital status  Married. Number of Children  2. Living Situation  Lives with spouse.  Past Surgical History (Travis Fletcher; 07-02-17 3:49  PM) Tonsillectomy 1977.  Partial thyroidectomy  1979  Hernia repair 1987  Prostate cancer 2010-seed implants.  Cataract surgery on both eyes 2000.  Detached retina in left eye 2010.  Split retina in right eye 2010.  Laser Surgery [05/2015]: Left. to repair a hemorrhage in the eye  Medication History Travis Page, MD; 06/25/2017 6:46 AM) Travis Fletcher SureClick (140MG /ML Soln Auto-inj, 1 inject Millilite Millilite Subcutaneous every two weeks, Taken starting 06/10/2017) Active. (Approved until 06/07/2018) Losartan Potassium (25MG  Tablet,  (one half) Tablet Oral daily, Taken starting 05/22/2017) Active. Metoprolol Tartrate (25MG  Tablet, 1/2 Tablet Tablet Oral two times daily, Taken starting 06/07/2016) Active. Nitrostat (0.4MG  Tab Sublingual, 1 (one) Tab Sublingual Tab Sub Sublingual Keep one ta blet under the tonge every 5 minutes as needed for chest pain, Taken starting 11/10/2014) Active. Aspirin (81MG  Tablet, 1 Oral daily) Active. Novalog Insulin Pump Active. (Continuous Infusion) Medications Reconciled (verbally with pt; no list or medication present)  Diagnostic Studies History (Travis Fletcher; 06/24/2017 3:55 PM) Coronary Angiogram [06/15/2015]: Findings Hyperdynamic LV, EF 65-70%. No wall motion abnormality. Mildly calcified coronary vessels. Balloon angioplasty of diffusely diseased PDA performed 07/13/2012 patent. Ramus intermediate balloon angioplasty site performed in 2012 widely patent. Mid circumflex 4.0 x 15 mm integrity non-DES stent placed 01/06/2011 widely patent. Stress EKG 01/01/11: Positive for ischemia with chest pain. 2.5 mm inf-lat ST depression.  Heart cath 01/06/11: Hyperdynamic LVEF, LVOT obstruction. Midl LAD 90%  Exercise sestamibi: 04/11/11: Positive EKG at 8 min of exercise. No chest pain. Normal perfusion and EF and normal wall motion.   Other Problems (Travis Fletcher; 06/24/2017 3:49 PM) Unspecified Diagnosis     Review of Systems  Travis Page MD; 06/25/2017 6:43 AM) General Not Present- Fever and Night Sweats. Cardiovascular Present- Chest Pain and Difficulty Breathing On Exertion. Not Present- Claudications, Edema, Fainting, Orthopnea, Palpitations and Paroxysmal Nocturnal Dyspnea. Gastrointestinal Not Present- Abdominal Pain, Constipation, Diarrhea, Nausea and Vomiting. Musculoskeletal Present- Back Pain. Not Present- Joint Swelling. Neurological Not Present- Focal Neurological Symptoms and Headaches. Hematology Not Present- Blood Clots, Easy Bruising and Nose Bleed. All other systems negative  Vitals (Travis Fletcher; 06/24/2017 3:57 PM) 06/24/2017 3:51 PM Weight: 206.5 lb Height: 70in Body Surface Area: 2.12 m Body Mass Index: 29.63 kg/m  Pulse: 89 (Regular)  P.OX: 95% (Room air) BP: 118/62 (Sitting, Left Arm, Standard)       Physical Exam Travis Page, MD; 06/25/2017 6:46 AM) General Mental Status-Alert. General Appearance-Cooperative, Appears stated age, Not in acute distress. Orientation-Oriented X3. Build & Nutrition-Well built and Well nourished.  Head and Neck Thyroid Gland Characteristics - no palpable nodules, no palpable enlargement.  Chest and Lung Exam Chest and lung exam reveals -quiet, even and easy respiratory effort with no use of accessory muscles and on auscultation, normal breath sounds, no adventitious sounds.  Cardiovascular Cardiovascular examination reveals -normal heart sounds, regular rate and rhythm with no murmurs, carotid auscultation reveals no bruits, abdominal aorta auscultation reveals no bruits and no prominent pulsation, femoral artery auscultation bilaterally reveals normal pulses, no bruits, no thrills, normal pedal pulses bilaterally and no digital clubbing, cyanosis, edema, increased warmth or tenderness.  Abdomen Inspection Hernias - Ventral hernia - Reducible. Palpation/Percussion Normal exam - Non Tender and No  hepatosplenomegaly. Auscultation Normal exam - Bowel sounds normal.  Neurologic Motor-Grossly intact without any focal deficits.  Musculoskeletal Global Assessment Left Lower Extremity - normal range of motion without pain. Right Lower Extremity - normal range of motion without pain.  Assessment & Plan Travis Page MD; 06/25/2017 6:47 AM) Atherosclerosis of native  coronary artery of native heart with angina pectoris (I25.10) Story: Coronary angiogram 06/15/2015: Hyperdynamic LV, EF 65-70%. Mildly calcified coronary vessels. Stenting to Mid LAD with 3.0x22 Resolute DES. Balloon angioplasty site at PDA performed 07/13/2012 & Ramus intermediate balloon angioplasty site performed in 2012 widely patent. Mid circumflex 4.0 x 15 mm integrity non-DES stent placed 01/06/2011 widely patent.  Exercise sestamibi: 04/11/11: Positive EKG at 8 min of exercise. No chest pain. Normal perfusion and EF and normal wall motion. Impression: EKG 06/24/2017: Normal sinus rhythm at rate of 74 bpm, left atrial enlargement, normal axis. No evidence of ischemia, otherwise normal EKG. No significant change from EKG 02/12/2017. Current Plans Complete electrocardiogram (93000) AP (angina pectoris) (I20.9) Current Plans Started Isosorbide Mononitrate ER 60MG , 1 (one) Tablet daily, #30, 30 days starting 06/24/2017, Ref. x2. Restarted Brilinta 90MG , 1 Tablet two times daily, #28, 14 days starting 06/24/2017, No Refill. [Samples Given] Uncontrolled type 1 diabetes mellitus with mild nonproliferative retinopathy without macular edema, with long-term current use of insulin (G64.4034) Story: H/o Vitreous bleed 12/16/2014 Hypercholesteremia (E78.00) Labwork Story: 12/22/2016: Hemoglobin A1c 8.6%. Creatinine 0.9, potassium 4.6, CMP otherwise normal. Cholesterol 130, HDL 52, triglycerides 150, LDL 48.  Labs 05/12/2016: Total cholesterol 136, triglycerides 85, HDL 63, LDL 56.  Labs 12/23/10 TC 248, TG 99, HDL 65, LDL  163.  Note:- Recommendations:  His symptoms of chest pain with radiation to the neck associated with dyspnea with minimal at 2 disease is consistent with intermediate coronary syndrome. I have started him on isosorbide mononitrate, also started him on Brilinta 90 mg b.i.d. He needs cardiac catheterization. Advised him not to return to work and not to expose himself to cold air which can induce coronary spasm. Patient instructed not to do heavy lifting, heavy exertional activity, swimming until evaluation is complete. Patient instructed to call if symptoms worse or to go to the ED for further evaluation. Cardiac cath set up for day after tomorrow.  DM continues to be uncontrolled, lipids are now controlled. He does not have hypertension. Office visit after the tests. Schedule for cardiac catheterization, and possible angioplasty. We discussed regarding risks, benefits, alternatives to this including stress testing, CTA and continued medical therapy. Patient wants to proceed. Understands <1-2% risk of death, stroke, MI, urgent CABG, bleeding, infection, renal failure but not limited to these.  CC: Travis Fletcher. CC: Travis Hait, MD    Signed by Travis Page, MD (06/25/2017 6:47 AM)

## 2017-06-26 ENCOUNTER — Encounter (HOSPITAL_COMMUNITY): Payer: Self-pay | Admitting: Cardiology

## 2017-06-26 ENCOUNTER — Other Ambulatory Visit: Payer: Self-pay

## 2017-06-26 ENCOUNTER — Ambulatory Visit (HOSPITAL_COMMUNITY)
Admission: RE | Admit: 2017-06-26 | Discharge: 2017-06-26 | Disposition: A | Discharge status: Home / Self Care | Payer: BLUE CROSS/BLUE SHIELD | Source: Ambulatory Visit | Attending: Cardiology | Admitting: Cardiology

## 2017-06-26 ENCOUNTER — Encounter (HOSPITAL_COMMUNITY)
Admission: RE | Disposition: A | Discharge status: Home / Self Care | Payer: Self-pay | Source: Ambulatory Visit | Attending: Cardiology

## 2017-06-26 DIAGNOSIS — E1065 Type 1 diabetes mellitus with hyperglycemia: Secondary | ICD-10-CM | POA: Insufficient documentation

## 2017-06-26 DIAGNOSIS — E1159 Type 2 diabetes mellitus with other circulatory complications: Secondary | ICD-10-CM | POA: Diagnosis present

## 2017-06-26 DIAGNOSIS — I25110 Atherosclerotic heart disease of native coronary artery with unstable angina pectoris: Secondary | ICD-10-CM | POA: Insufficient documentation

## 2017-06-26 DIAGNOSIS — I2584 Coronary atherosclerosis due to calcified coronary lesion: Secondary | ICD-10-CM | POA: Insufficient documentation

## 2017-06-26 DIAGNOSIS — E785 Hyperlipidemia, unspecified: Secondary | ICD-10-CM | POA: Insufficient documentation

## 2017-06-26 DIAGNOSIS — I200 Unstable angina: Secondary | ICD-10-CM | POA: Diagnosis not present

## 2017-06-26 DIAGNOSIS — Z794 Long term (current) use of insulin: Secondary | ICD-10-CM | POA: Diagnosis not present

## 2017-06-26 DIAGNOSIS — I10 Essential (primary) hypertension: Secondary | ICD-10-CM | POA: Insufficient documentation

## 2017-06-26 DIAGNOSIS — E7800 Pure hypercholesterolemia, unspecified: Secondary | ICD-10-CM | POA: Diagnosis not present

## 2017-06-26 DIAGNOSIS — E103299 Type 1 diabetes mellitus with mild nonproliferative diabetic retinopathy without macular edema, unspecified eye: Secondary | ICD-10-CM | POA: Insufficient documentation

## 2017-06-26 DIAGNOSIS — Z7982 Long term (current) use of aspirin: Secondary | ICD-10-CM | POA: Diagnosis not present

## 2017-06-26 DIAGNOSIS — I209 Angina pectoris, unspecified: Secondary | ICD-10-CM

## 2017-06-26 DIAGNOSIS — Z79899 Other long term (current) drug therapy: Secondary | ICD-10-CM | POA: Diagnosis not present

## 2017-06-26 DIAGNOSIS — Z955 Presence of coronary angioplasty implant and graft: Secondary | ICD-10-CM | POA: Insufficient documentation

## 2017-06-26 DIAGNOSIS — Z8249 Family history of ischemic heart disease and other diseases of the circulatory system: Secondary | ICD-10-CM | POA: Insufficient documentation

## 2017-06-26 HISTORY — PX: LEFT HEART CATH AND CORONARY ANGIOGRAPHY: CATH118249

## 2017-06-26 LAB — BASIC METABOLIC PANEL
Anion gap: 10 (ref 5–15)
BUN: 12 mg/dL (ref 6–20)
CO2: 23 mmol/L (ref 22–32)
CREATININE: 0.91 mg/dL (ref 0.61–1.24)
Calcium: 9.2 mg/dL (ref 8.9–10.3)
Chloride: 104 mmol/L (ref 101–111)
GFR calc non Af Amer: >60 mL/min (ref >60)
GLUCOSE: 203 mg/dL — AB (ref 65–99)
Potassium: 4.2 mmol/L (ref 3.5–5.1)
Sodium: 137 mmol/L (ref 135–145)

## 2017-06-26 LAB — PROTIME-INR
INR: 0.94
Prothrombin Time: 12.5 seconds (ref 11.4–15.2)

## 2017-06-26 LAB — CBC
HEMATOCRIT: 44.6 % (ref 39.0–52.0)
HEMOGLOBIN: 15.7 g/dL (ref 13.0–17.0)
MCH: 32.1 pg (ref 26.0–34.0)
MCHC: 35.2 g/dL (ref 30.0–36.0)
MCV: 91.2 fL (ref 78.0–100.0)
Platelets: 236 10*3/uL (ref 150–400)
RBC: 4.89 MIL/uL (ref 4.22–5.81)
RDW: 12.1 % (ref 11.5–15.5)
WBC: 8.5 10*3/uL (ref 4.0–10.5)

## 2017-06-26 LAB — GLUCOSE, CAPILLARY
GLUCOSE-CAPILLARY: 125 mg/dL — AB (ref 65–99)
Glucose-Capillary: 197 mg/dL — ABNORMAL HIGH (ref 65–99)

## 2017-06-26 SURGERY — LEFT HEART CATH AND CORONARY ANGIOGRAPHY
Anesthesia: LOCAL

## 2017-06-26 MED ORDER — MIDAZOLAM HCL 2 MG/2ML IJ SOLN
INTRAMUSCULAR | Status: AC
  Filled 2017-06-26: qty 2

## 2017-06-26 MED ORDER — SODIUM CHLORIDE 0.9% FLUSH: 3.0000 mL | Freq: Two times a day (BID) | INTRAVENOUS | Status: DC

## 2017-06-26 MED ORDER — ASPIRIN 81 MG PO CHEW: 81.0000 mg | CHEWABLE_TABLET | ORAL | Status: DC

## 2017-06-26 MED ORDER — SODIUM CHLORIDE 0.9 % WEIGHT BASED INFUSION
3.0000 mL/kg/h | INTRAVENOUS | Status: AC
  Administered 2017-06-26: 3 mL/kg/h via INTRAVENOUS

## 2017-06-26 MED ORDER — HEPARIN SODIUM (PORCINE) 1000 UNIT/ML IJ SOLN
INTRAMUSCULAR | Status: AC
  Filled 2017-06-26: qty 1

## 2017-06-26 MED ORDER — HEPARIN SODIUM (PORCINE) 1000 UNIT/ML IJ SOLN
INTRAMUSCULAR | Status: DC | PRN
  Administered 2017-06-26: 8000 [IU] via INTRAVENOUS

## 2017-06-26 MED ORDER — SODIUM CHLORIDE 0.9 % WEIGHT BASED INFUSION: 1.0000 mL/kg/h | INTRAVENOUS | Status: DC

## 2017-06-26 MED ORDER — LIDOCAINE HCL (PF) 1 % IJ SOLN
INTRAMUSCULAR | Status: AC
  Filled 2017-06-26: qty 30

## 2017-06-26 MED ORDER — VERAPAMIL HCL 2.5 MG/ML IV SOLN
INTRAVENOUS | Status: AC
  Filled 2017-06-26: qty 2

## 2017-06-26 MED ORDER — SODIUM CHLORIDE 0.9 % IV SOLN
INTRAVENOUS | Status: AC | PRN
  Administered 2017-06-26: 999 mL/h via INTRAVENOUS

## 2017-06-26 MED ORDER — HEPARIN (PORCINE) IN NACL 2-0.9 UNIT/ML-% IJ SOLN
INTRAMUSCULAR | Status: AC
  Filled 2017-06-26: qty 1000

## 2017-06-26 MED ORDER — SODIUM CHLORIDE 0.9 % IV SOLN: 250.0000 mL | INTRAVENOUS | Status: DC | PRN

## 2017-06-26 MED ORDER — HEPARIN (PORCINE) IN NACL 2-0.9 UNIT/ML-% IJ SOLN
INTRAMUSCULAR | Status: AC | PRN
  Administered 2017-06-26: 1000 mL

## 2017-06-26 MED ORDER — HYDROMORPHONE HCL 1 MG/ML IJ SOLN
INTRAMUSCULAR | Status: AC
  Filled 2017-06-26: qty 0.5

## 2017-06-26 MED ORDER — IOPAMIDOL (ISOVUE-370) INJECTION 76%
INTRAVENOUS | Status: DC | PRN
  Administered 2017-06-26: 50 mL via INTRAVENOUS

## 2017-06-26 MED ORDER — HYDROMORPHONE HCL 1 MG/ML IJ SOLN
INTRAMUSCULAR | Status: DC | PRN
  Administered 2017-06-26: 0.5 mg via INTRAVENOUS

## 2017-06-26 MED ORDER — MIDAZOLAM HCL 2 MG/2ML IJ SOLN
INTRAMUSCULAR | Status: DC | PRN
  Administered 2017-06-26: 2 mg via INTRAVENOUS

## 2017-06-26 MED ORDER — SODIUM CHLORIDE 0.9% FLUSH: 3.0000 mL | INTRAVENOUS | Status: DC | PRN

## 2017-06-26 MED ORDER — LIDOCAINE HCL (PF) 1 % IJ SOLN
INTRAMUSCULAR | Status: DC | PRN
  Administered 2017-06-26: 2 mL

## 2017-06-26 MED ORDER — VERAPAMIL HCL 2.5 MG/ML IV SOLN
INTRA_ARTERIAL | Status: DC | PRN
  Administered 2017-06-26: 10 mL via INTRA_ARTERIAL

## 2017-06-26 MED ORDER — IOPAMIDOL (ISOVUE-370) INJECTION 76%
INTRAVENOUS | Status: AC
  Filled 2017-06-26: qty 100

## 2017-06-26 MED ORDER — NITROGLYCERIN 1 MG/10 ML FOR IR/CATH LAB
INTRA_ARTERIAL | Status: AC
  Filled 2017-06-26: qty 10

## 2017-06-26 SURGICAL SUPPLY — 9 items
CATH OPTITORQUE TIG 4.0 5F (CATHETERS) ×2 IMPLANT
DEVICE RAD COMP TR BAND LRG (VASCULAR PRODUCTS) ×1 IMPLANT
GLIDESHEATH SLEND A-KIT 6F 20G (SHEATH) ×1 IMPLANT
GUIDEWIRE INQWIRE 1.5J.035X260 (WIRE) IMPLANT
INQWIRE 1.5J .035X260CM (WIRE) ×2
KIT HEART LEFT (KITS) ×2 IMPLANT
PACK CARDIAC CATHETERIZATION (CUSTOM PROCEDURE TRAY) ×2 IMPLANT
TRANSDUCER W/STOPCOCK (MISCELLANEOUS) ×2 IMPLANT
TUBING CIL FLEX 10 FLL-RA (TUBING) ×2 IMPLANT

## 2017-06-26 NOTE — Interval H&P Note (Signed)
History and Physical Interval Note:  06/26/2017 8:59 AM  Travis Fletcher.  has presented today for surgery, with the diagnosis of angina  The various methods of treatment have been discussed with the patient and family. After consideration of risks, benefits and other options for treatment, the patient has consented to  Procedure(s): LEFT HEART CATH AND CORONARY ANGIOGRAPHY (N/A) and possible angioplasty as a surgical intervention .  The patient's history has been reviewed, patient examined, no change in status, stable for surgery.  I have reviewed the patient's chart and labs.  Questions were answered to the patient's satisfaction.  Symptom Status: Ischemic Symptoms Non-invasive Testing: Not done If no or indeterminate stress test, FFR/iFR results in all diseased vessels: Not done Diabetes Mellitus: Yes S/P CABG: No Antianginal therapy (number of long-acting drugs): 1 Patient undergoing renal transplant: No Patient undergoing percutaneous valve procedure: No   1 Vessel Disease No proximal LAD involvement, No proximal left dominant LCX involvement  PCI: Not rated  CABG: Not rated Proximal left dominant LCX involvement  PCI: Not rated  CABG: Not rated Proximal LAD involvement  PCI: Not rated  CABG: Not rated  2 Vessel Disease No proximal LAD involvement  PCI: Not rated  CABG: Not rated Proximal LAD involvement  PCI: Not rated  CABG: Not rated  3 Vessel Disease Low disease complexity (e.g., focal stenoses, SYNTAX <=22)  PCI: Not rated  CABG: Not rated Intermediate or high disease complexity (e.g., SYNTAX >=23)  PCI: Not rated  CABG: Not rated  Left Main Disease Isolated LMCA disease: ostial or midshaft  PCI: A (7);  Indication 24  CABG: A (9);  Indication 24 Isolated LMCA disease: bifurcation involvement  PCI: M (5);  Indication 25  CABG: A (9);  Indication 25 LMCA ostial or midshaft, concurrent low disease burden multivessel disease (e.g., 1-2 additional  focal stenoses, SYNTAX <=22)  PCI: A (7);  Indication 26  CABG: A (9);  Indication 26 LMCA ostial or midshaft, concurrent intermediate or high disease burden multivessel disease (e.g., 1-2 additional bifurcation stenoses, long stenoses, SYNTAX >=23)  PCI: M (4);  Indication 27  CABG: A (9);  Indication 27 LMCA bifurcation involvement, concurrent low disease burden multivessel disease (e.g., 1-2 additional focal stenoses, SYNTAX <=22)  PCI: M (5);  Indication 28  CABG: A (9);  Indication 28 LMCA bifurcation involvement, concurrent intermediate or high disease burden multivessel disease (e.g., 1-2 additional bifurcation stenoses, long stenoses, SYNTAX >=23)  PCI: R (3);  Indication 29  CABG: A (9);  Indication 29  Notes:  A indicates appropriate. M indicates may be appropriate. R indicates rarely appropriate. Number in parentheses is median score for that indication. Reclassify indicates number of functionally diseased vessels should be decreased given negative FFR/iFR. Re-evaluate the scenario interpreting any FFR/iFR negative vessel as being not significantly stenosed.  Disease means involved vessel provides flow to a sufficient amount of myocardium to be clinically important.  If FFR testing indicates a vessel is not significant, that vessel should not be considered diseased (and the patient should be reclassified with respect to extent of functionally significant disease).  Proximal LAD + proximal left dominant LCX is considered 3 vessel CAD  2 Vessel CAD with FFR/iFR abnormal in only 1 but not both is considered 1 vessel CAD  Disease complexity includes occlusion, bifurcation, trifurcation, ostial, >20 mm, tortuosity, calcification, thrombus  LMCA disease is >=50% by angiography, MLD <2.8 mm, MLA <6 mm2; MLA 6-7.5 mm2 requires further physiologic  See Table B  for risk stratification based on noninvasive testing  Journal of the [employer] of Cardiology Mar 2017, 23391; DOI:  10.1016/j.jacc.2017.02.001 [email or web address].[email or web address].full-text.pdf This App  2018 by the Society for Cardiovascular Angiography and Interventions    Adrian Prows

## 2017-06-26 NOTE — Discharge Instructions (Signed)

## 2017-06-26 NOTE — Progress Notes (Signed)
Patient had insulin pump and reported a basal rate of 5 or 7 as he was not sure at the time when he first came to recovery from procedure.  Patient reported doing a 5 unit carb based count dose for he meal coverage.  His blood sugar check was 125 and done just minutes before his self bolus and eating his lunch around 10 AM.  Patient was discharged to his home with is wife and in stable condition with no complaints.  Larey Dresser RN BSN

## 2017-07-04 NOTE — Progress Notes (Signed)
Addendum and correction to the HPI on 06/26/2017:  Atherosclerosis of native coronary artery of native heart with angina pectoris (I25.10) Story: Coronary angiogram 06/15/2015: Hyperdynamic LV, EF 65-70%. Mildly calcified coronary vessels. Stenting to Mid LAD with 3.0x22 Resolute DES. Balloon angioplasty site at PDA performed 07/13/2012 & Ramus intermediate balloon angioplasty site performed in 2012 widely patent. Mid circumflex 4.0 x 15 mm integrity non-DES stent placed 01/06/2011 widely patent.  Exercise sestamibi: 04/11/11: Positive EKG at 8 min of exercise. No chest pain. Normal perfusion and EF and normal wall motion. Impression: EKG 06/24/2017: Normal sinus rhythm at rate of 74 bpm, left atrial enlargement, normal axis. No evidence of ischemia, otherwise normal EKG. No significant change from EKG 02/12/2017. Current Plans Complete electrocardiogram (93000) AP (angina pectoris) (I20.9) Current Plans Started Isosorbide Mononitrate ER 60MG , 1 (one) Tablet daily, #30, 30 days starting 06/24/2017, Ref. x2. Restarted Brilinta 90MG , 1 Tablet two times daily, #28, 14 days starting 06/24/2017, No Refill. [Samples Given] Uncontrolled type 1 diabetes mellitus with mild nonproliferative retinopathy without macular edema, with long-term current use of insulin (Y80.1655) with hyperglycemia Story: H/o Vitreous bleed 12/16/2014 Hypercholesteremia (E78.00)   Adrian Prows, MD 07/04/2017, 11:45 AM [facility] [place] Pager: [phone] Office: [phone] If no answer: Cell:  [phone]

## 2017-07-06 DIAGNOSIS — I200 Unstable angina: Secondary | ICD-10-CM | POA: Diagnosis not present

## 2017-07-06 DIAGNOSIS — I2510 Atherosclerotic heart disease of native coronary artery without angina pectoris: Secondary | ICD-10-CM | POA: Diagnosis not present

## 2017-07-06 DIAGNOSIS — E103299 Type 1 diabetes mellitus with mild nonproliferative diabetic retinopathy without macular edema, unspecified eye: Secondary | ICD-10-CM | POA: Diagnosis not present

## 2017-07-06 DIAGNOSIS — E1065 Type 1 diabetes mellitus with hyperglycemia: Secondary | ICD-10-CM | POA: Diagnosis not present

## 2017-07-13 DIAGNOSIS — Z9641 Presence of insulin pump (external) (internal): Secondary | ICD-10-CM | POA: Diagnosis not present

## 2017-07-13 DIAGNOSIS — E109 Type 1 diabetes mellitus without complications: Secondary | ICD-10-CM | POA: Diagnosis not present

## 2017-07-13 DIAGNOSIS — Z794 Long term (current) use of insulin: Secondary | ICD-10-CM | POA: Diagnosis not present

## 2017-07-13 DIAGNOSIS — E108 Type 1 diabetes mellitus with unspecified complications: Secondary | ICD-10-CM | POA: Diagnosis not present

## 2017-07-30 DIAGNOSIS — L820 Inflamed seborrheic keratosis: Secondary | ICD-10-CM | POA: Diagnosis not present

## 2017-07-30 DIAGNOSIS — L570 Actinic keratosis: Secondary | ICD-10-CM | POA: Diagnosis not present

## 2017-07-30 DIAGNOSIS — L821 Other seborrheic keratosis: Secondary | ICD-10-CM | POA: Diagnosis not present

## 2017-07-30 DIAGNOSIS — Z85828 Personal history of other malignant neoplasm of skin: Secondary | ICD-10-CM | POA: Diagnosis not present

## 2017-07-30 DIAGNOSIS — D1801 Hemangioma of skin and subcutaneous tissue: Secondary | ICD-10-CM | POA: Diagnosis not present

## 2017-08-11 DIAGNOSIS — E108 Type 1 diabetes mellitus with unspecified complications: Secondary | ICD-10-CM | POA: Diagnosis not present

## 2017-08-11 DIAGNOSIS — E109 Type 1 diabetes mellitus without complications: Secondary | ICD-10-CM | POA: Diagnosis not present

## 2017-08-11 DIAGNOSIS — Z9641 Presence of insulin pump (external) (internal): Secondary | ICD-10-CM | POA: Diagnosis not present

## 2017-08-11 DIAGNOSIS — Z794 Long term (current) use of insulin: Secondary | ICD-10-CM | POA: Diagnosis not present

## 2017-08-18 DIAGNOSIS — I2510 Atherosclerotic heart disease of native coronary artery without angina pectoris: Secondary | ICD-10-CM | POA: Diagnosis not present

## 2017-08-18 DIAGNOSIS — R0609 Other forms of dyspnea: Secondary | ICD-10-CM | POA: Diagnosis not present

## 2017-08-18 DIAGNOSIS — E1065 Type 1 diabetes mellitus with hyperglycemia: Secondary | ICD-10-CM | POA: Diagnosis not present

## 2017-08-18 DIAGNOSIS — E103299 Type 1 diabetes mellitus with mild nonproliferative diabetic retinopathy without macular edema, unspecified eye: Secondary | ICD-10-CM | POA: Diagnosis not present

## 2017-08-27 DIAGNOSIS — I2510 Atherosclerotic heart disease of native coronary artery without angina pectoris: Secondary | ICD-10-CM | POA: Diagnosis not present

## 2017-08-27 DIAGNOSIS — R0609 Other forms of dyspnea: Secondary | ICD-10-CM | POA: Diagnosis not present

## 2017-08-31 DIAGNOSIS — R0609 Other forms of dyspnea: Secondary | ICD-10-CM | POA: Diagnosis not present

## 2017-09-02 DIAGNOSIS — E1065 Type 1 diabetes mellitus with hyperglycemia: Secondary | ICD-10-CM | POA: Diagnosis not present

## 2017-09-02 DIAGNOSIS — Z794 Long term (current) use of insulin: Secondary | ICD-10-CM | POA: Diagnosis not present

## 2017-09-04 DIAGNOSIS — R9439 Abnormal result of other cardiovascular function study: Secondary | ICD-10-CM | POA: Diagnosis not present

## 2017-10-23 DIAGNOSIS — C61 Malignant neoplasm of prostate: Secondary | ICD-10-CM | POA: Diagnosis not present

## 2017-11-03 DIAGNOSIS — E113291 Type 2 diabetes mellitus with mild nonproliferative diabetic retinopathy without macular edema, right eye: Secondary | ICD-10-CM | POA: Diagnosis not present

## 2017-11-03 DIAGNOSIS — Z961 Presence of intraocular lens: Secondary | ICD-10-CM | POA: Diagnosis not present

## 2017-11-03 DIAGNOSIS — E113592 Type 2 diabetes mellitus with proliferative diabetic retinopathy without macular edema, left eye: Secondary | ICD-10-CM | POA: Diagnosis not present

## 2017-11-03 DIAGNOSIS — H33193 Other retinoschisis and retinal cysts, bilateral: Secondary | ICD-10-CM | POA: Diagnosis not present

## 2017-11-23 DIAGNOSIS — Z9641 Presence of insulin pump (external) (internal): Secondary | ICD-10-CM | POA: Diagnosis not present

## 2017-11-23 DIAGNOSIS — Z794 Long term (current) use of insulin: Secondary | ICD-10-CM | POA: Diagnosis not present

## 2017-11-23 DIAGNOSIS — E108 Type 1 diabetes mellitus with unspecified complications: Secondary | ICD-10-CM | POA: Diagnosis not present

## 2017-11-23 DIAGNOSIS — E109 Type 1 diabetes mellitus without complications: Secondary | ICD-10-CM | POA: Diagnosis not present

## 2017-12-16 DIAGNOSIS — E1065 Type 1 diabetes mellitus with hyperglycemia: Secondary | ICD-10-CM | POA: Diagnosis not present

## 2017-12-16 DIAGNOSIS — Z794 Long term (current) use of insulin: Secondary | ICD-10-CM | POA: Diagnosis not present

## 2017-12-16 DIAGNOSIS — R5383 Other fatigue: Secondary | ICD-10-CM | POA: Diagnosis not present

## 2017-12-19 IMAGING — DX DG CHEST 2V
2 series · 2 of 2 positions shown · non-contrast
Comparison: 10/29/2015

CLINICAL DATA: Cough and shortness of breath for 2 days.

EXAM:
CHEST  2 VIEW

[dg chest 2 view (1 of 2)]
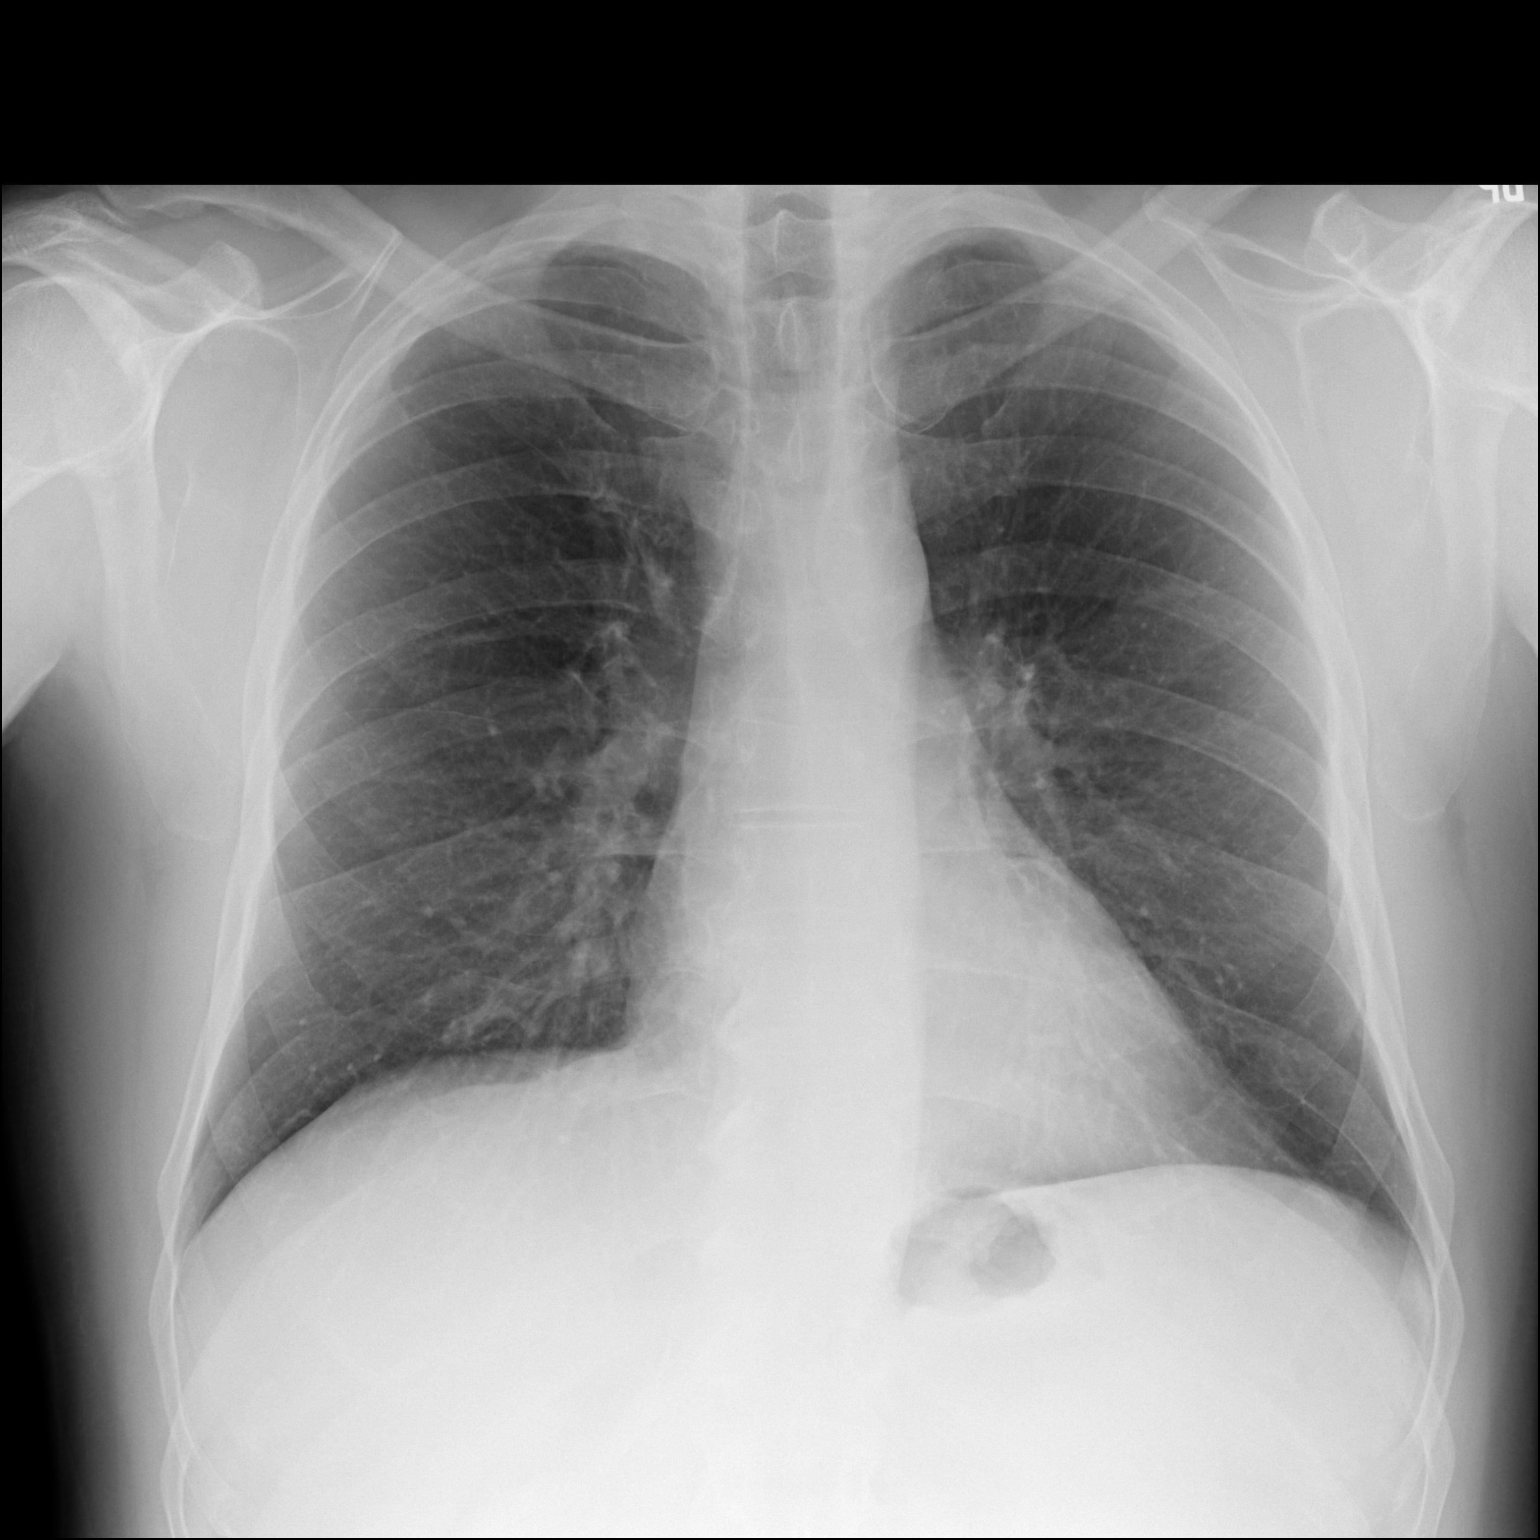

[dg chest 2 view (2 of 2)]
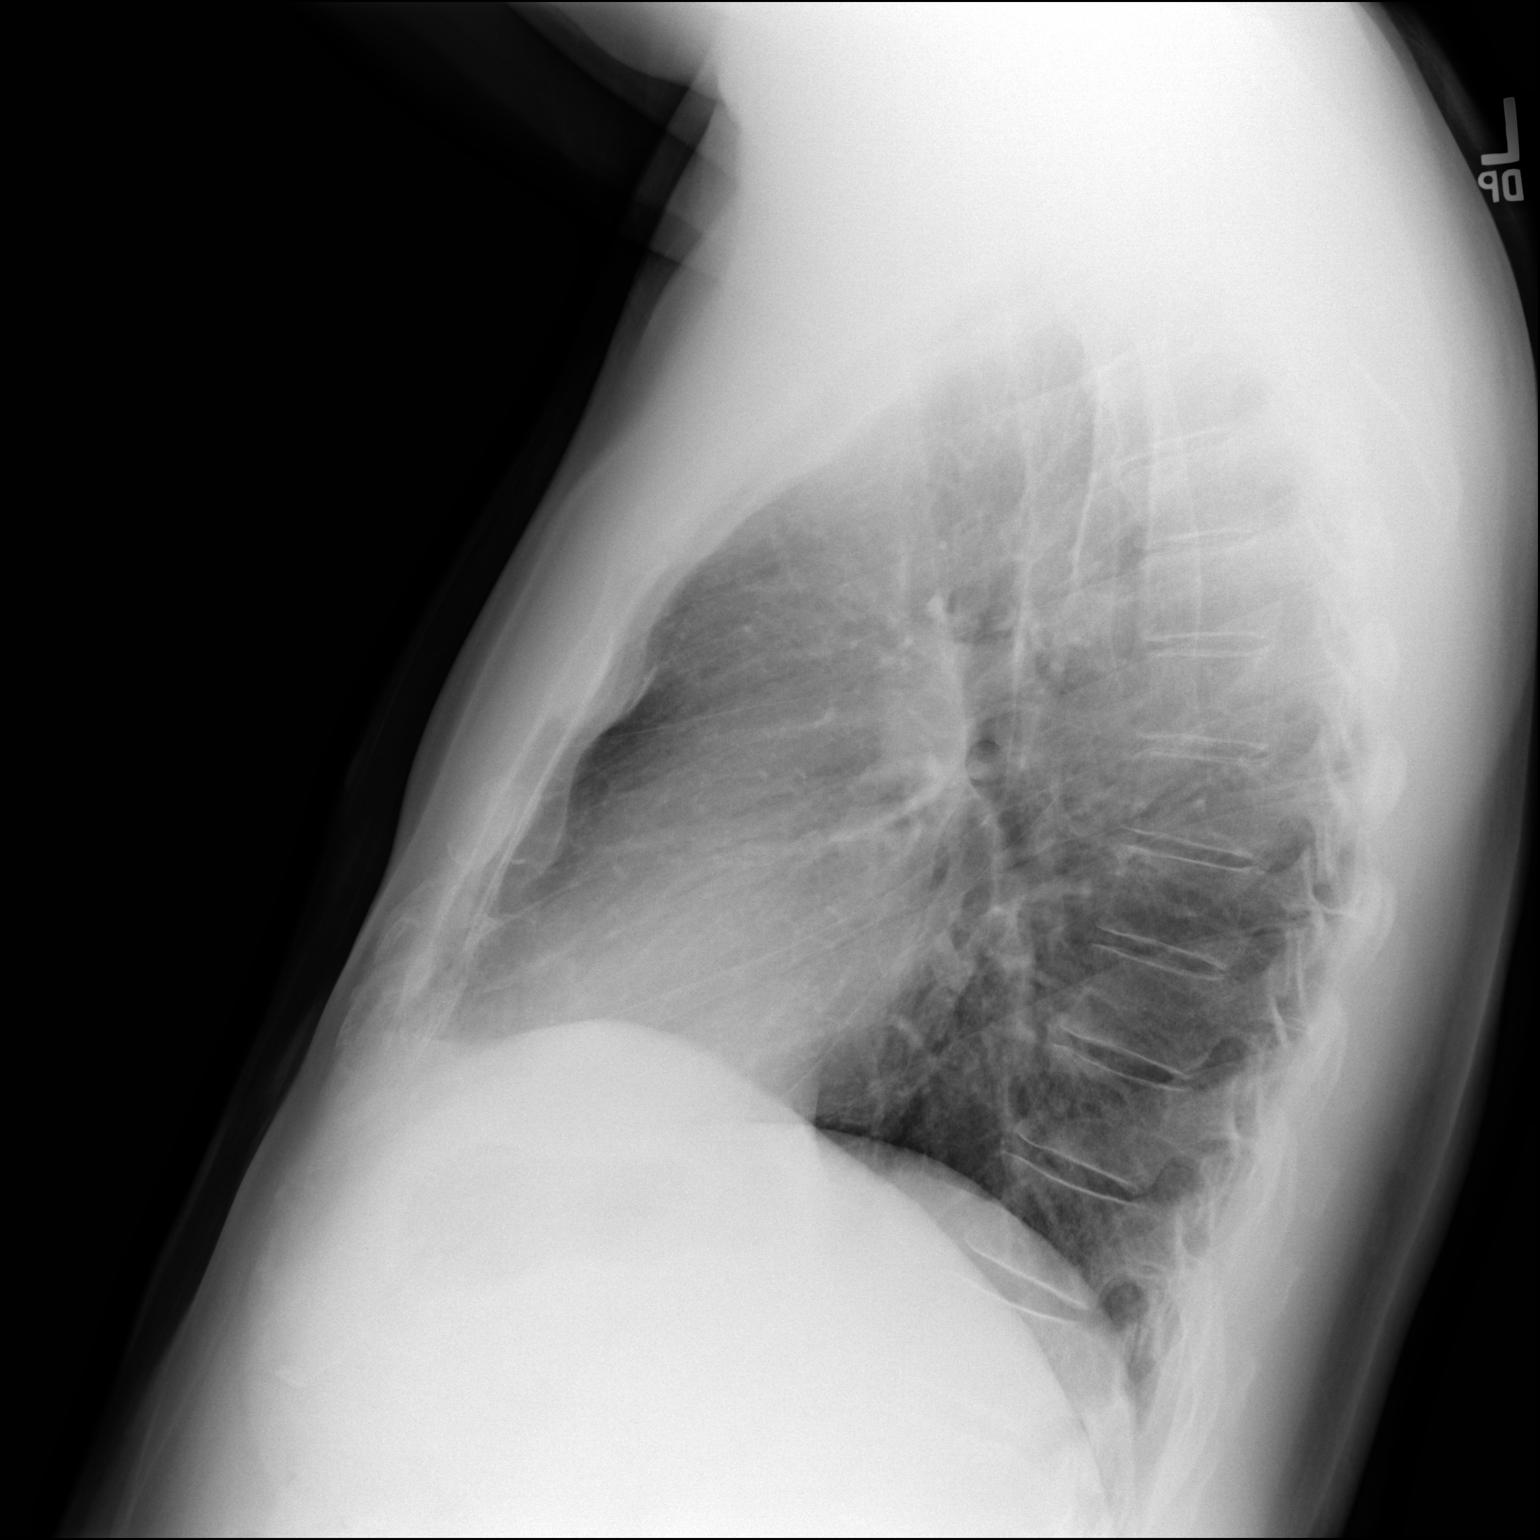

[2 of 2 positions shown; findings below may reference images not displayed]

FINDINGS: Normal heart size and mediastinal contours. No acute infiltrate or
edema. No effusion or pneumothorax. Flat pleural based density along
the lateral left chest wall is stable from 7209, likely subpleural
fat. No acute osseous findings.
IMPRESSION: No active cardiopulmonary disease.

## 2017-12-22 DIAGNOSIS — R5383 Other fatigue: Secondary | ICD-10-CM | POA: Diagnosis not present

## 2017-12-22 DIAGNOSIS — I2510 Atherosclerotic heart disease of native coronary artery without angina pectoris: Secondary | ICD-10-CM | POA: Diagnosis not present

## 2017-12-22 DIAGNOSIS — R5381 Other malaise: Secondary | ICD-10-CM | POA: Diagnosis not present

## 2017-12-22 DIAGNOSIS — R0609 Other forms of dyspnea: Secondary | ICD-10-CM | POA: Diagnosis not present

## 2018-01-14 DIAGNOSIS — I2510 Atherosclerotic heart disease of native coronary artery without angina pectoris: Secondary | ICD-10-CM | POA: Diagnosis not present

## 2018-01-14 DIAGNOSIS — R0609 Other forms of dyspnea: Secondary | ICD-10-CM | POA: Diagnosis not present

## 2018-01-14 DIAGNOSIS — M2550 Pain in unspecified joint: Secondary | ICD-10-CM | POA: Diagnosis not present

## 2018-01-14 DIAGNOSIS — Z0189 Encounter for other specified special examinations: Secondary | ICD-10-CM | POA: Diagnosis not present

## 2018-01-15 DIAGNOSIS — R5381 Other malaise: Secondary | ICD-10-CM | POA: Diagnosis not present

## 2018-01-15 DIAGNOSIS — M2550 Pain in unspecified joint: Secondary | ICD-10-CM | POA: Diagnosis not present

## 2018-01-25 ENCOUNTER — Other Ambulatory Visit: Payer: Self-pay | Admitting: Family Medicine

## 2018-01-25 ENCOUNTER — Ambulatory Visit
Admission: RE | Admit: 2018-01-25 | Discharge: 2018-01-25 | Disposition: A | Discharge status: Home / Self Care | Payer: BLUE CROSS/BLUE SHIELD | Source: Ambulatory Visit | Attending: Family Medicine | Admitting: Family Medicine

## 2018-01-25 DIAGNOSIS — R059 Cough, unspecified: Secondary | ICD-10-CM

## 2018-01-25 DIAGNOSIS — R05 Cough: Secondary | ICD-10-CM

## 2018-01-25 DIAGNOSIS — R0602 Shortness of breath: Secondary | ICD-10-CM

## 2018-01-25 DIAGNOSIS — R0600 Dyspnea, unspecified: Secondary | ICD-10-CM | POA: Diagnosis not present

## 2018-01-29 ENCOUNTER — Encounter: Payer: Self-pay | Admitting: Emergency Medicine

## 2018-01-29 ENCOUNTER — Ambulatory Visit: Payer: BLUE CROSS/BLUE SHIELD | Admitting: Emergency Medicine

## 2018-01-29 VITALS — BP 120/68 | HR 97 | Ht 69.0 in | Wt 205.0 lb

## 2018-01-29 DIAGNOSIS — R0602 Shortness of breath: Secondary | ICD-10-CM

## 2018-01-29 DIAGNOSIS — R0683 Snoring: Secondary | ICD-10-CM | POA: Diagnosis not present

## 2018-01-29 DIAGNOSIS — R0600 Dyspnea, unspecified: Secondary | ICD-10-CM | POA: Insufficient documentation

## 2018-01-29 NOTE — Assessment & Plan Note (Signed)
We will discuss your sleep symptoms further when you follow-up.  Do not cancel your sleep testing at this time.

## 2018-01-29 NOTE — Patient Instructions (Signed)
We will perform full pulmonary function testing Your chest x-ray was reviewed and there was no significant abnormality seen.   Depending on your pulmonary function testing we will decide whether any further pulmonary evaluation is necessary including possible cardiopulmonary exercise testing. Please continue your medications as prescribed by Drs Einar Gip and Laurann Montana. We will discuss your sleep symptoms further when you follow-up.  Do not cancel your sleep testing at this time. Follow with Dr Lamonte Sakai next available with Full PFT

## 2018-01-29 NOTE — Progress Notes (Signed)
Subjective:    Patient ID: Travis Kung., male    DOB: Dec 22, 1955, 62 y.o.   MRN: [id number]  HPI 62 year old man, never smoker, with a history of coronary artery disease (PTCI, recent catheterization 06/26/2017, medical therapy), hypercholesterolemia, insulin-dependent diabetes, prostate CA (seed implants).   He is referred by Dr Laurann Montana for evaluation of exertional dyspnea and chest discomfort.  He had CP progressing in 1/19, prompted cath that showed a ruptured plaque that could not be intervened upon, treated medically Continued to have CP, dyspnea that occurs at any time with rest or exertion. He is able to do some work, but limited. Over about 6 weeks has had a new cough, minimally productive.  Wt stable. Rare GERD sx.   He snores, may have had witness apneas. He sleeps on couch, has shoulder pain that wakes him from sleep. Feels well rested during the day.   No family hx lung dz  Review of Systems  Constitutional: Negative for fever and unexpected weight change.  HENT: Negative for congestion, dental problem, ear pain, nosebleeds, postnasal drip, rhinorrhea, sinus pressure, sneezing, sore throat and trouble swallowing.   Eyes: Negative for redness and itching.  Respiratory: Positive for cough, chest tightness and shortness of breath. Negative for wheezing.   Cardiovascular: Negative for palpitations and leg swelling.  Gastrointestinal: Negative for nausea and vomiting.  Genitourinary: Negative for dysuria.  Musculoskeletal: Negative for joint swelling.  Skin: Negative for rash.  Neurological: Negative for headaches.  Hematological: Does not bruise/bleed easily.  Psychiatric/Behavioral: Negative for dysphoric mood. The patient is not nervous/anxious.    Past Medical History:  Diagnosis Date  . Anginal pain ([place])   . Arthritis    "hands" (07/11/2014)  . Coronary artery disease   . High cholesterol   . Insulin dependent diabetes mellitus ([place])   . Kidney stones     "passed them all"  . Neuromuscular disorder ([facility])    neuropathy  . Prostate cancer ([place]) 2010     No family history on file.   Social History   Socioeconomic History  . Marital status: Married    Spouse name: Not on file  . Number of children: Not on file  . Years of education: Not on file  . Highest education level: Not on file  Occupational History  . Not on file  Social Needs  . Financial resource strain: Not on file  . Food insecurity:    Worry: Not on file    Inability: Not on file  . Transportation needs:    Medical: Not on file    Non-medical: Not on file  Tobacco Use  . Smoking status: Never Smoker  . Smokeless tobacco: Never Used  Substance and Sexual Activity  . Alcohol use: Yes    Comment: 07/11/2014 "0-12 beers/wk; glass of wine when I want; nothing regular"  . Drug use: No  . Sexual activity: Not Currently  Lifestyle  . Physical activity:    Days per week: Not on file    Minutes per session: Not on file  . Stress: Not on file  Relationships  . Social connections:    Talks on phone: Not on file    Gets together: Not on file    Attends religious service: Not on file    Active member of club or organization: Not on file    Attends meetings of clubs or organizations: Not on file    Relationship status: Not on file  . Intimate partner violence:  Fear of current or ex partner: Not on file    Emotionally abused: Not on file    Physically abused: Not on file    Forced sexual activity: Not on file  Other Topics Concern  . Not on file  Social History Narrative  . 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  No military From [place] Never birds   Allergies  Allergen Reactions  . Brilinta [Ticagrelor]   . Statins      Outpatient Medications Prior to Visit  Medication Sig Dispense Refill  . aspirin 81 MG tablet Take 1 tablet (81 mg total) by mouth daily.    . clopidogrel (PLAVIX)  75 MG tablet Take 75 mg by mouth daily.    . insulin aspart (NOVOLOG) 100 UNIT/ML injection Inject into the skin See admin instructions. Uses via Insulin Pump    . losartan (COZAAR) 25 MG tablet Take 12.5 mg by mouth daily.    . metoprolol tartrate (LOPRESSOR) 25 MG tablet Take 12.5 mg by mouth 2 (two) times daily.    . Multiple Vitamins-Minerals (CENTRUM SILVER 50+MEN PO) Take 1 tablet by mouth daily.    . nitroGLYCERIN (NITROSTAT) 0.4 MG SL tablet Place 1 tablet (0.4 mg total) under the tongue every 5 (five) minutes as needed for chest pain. 30 tablet 6  . REPATHA SURECLICK 175 MG/ML SOAJ Inject 140 mg into the muscle every 14 (fourteen) days.   1  . vitamin B-12 (CYANOCOBALAMIN) 100 MCG tablet Take 100 mcg by mouth daily.    Marland Kitchen acetaminophen (TYLENOL) 500 MG tablet Take 1,000 mg by mouth every 6 (six) hours as needed for moderate pain or headache.    Marland Kitchen HYDROcodone-acetaminophen (NORCO/VICODIN) 5-325 MG tablet Take 1-2 tablets by mouth every 6 (six) hours as needed. (Patient not taking: Reported on 06/25/2017) 15 tablet 0  . isosorbide mononitrate (IMDUR) 60 MG 24 hr tablet Take 60 mg by mouth daily.    . ondansetron (ZOFRAN) 4 MG tablet Take 1 tablet (4 mg total) by mouth every 6 (six) hours. (Patient not taking: Reported on 06/25/2017) 12 tablet 0  . ticagrelor (BRILINTA) 90 MG TABS tablet Take 1 tablet (90 mg total) by mouth 2 (two) times daily. 60 tablet 0   No facility-administered medications prior to visit.         Objective:   Physical Exam Vitals:   01/29/18 1455  BP: 120/68  Pulse: 97  SpO2: 98%  Weight: 205 lb (93 kg)  Height: 5\' 9"  (1.753 m)   Gen: Pleasant, well-nourished, in no distress,  normal affect  ENT: No lesions,  mouth clear,  oropharynx clear, no postnasal drip  Neck: No JVD, no stridor  Lungs: No use of accessory muscles, no wheeze or crackles  Cardiovascular: RRR, heart sounds normal, no murmur or gallops, no peripheral edema  Musculoskeletal: No  deformities, no cyanosis or clubbing, no chest tenderness  Neuro: alert, non focal  Skin: Warm, no lesions or rash     Assessment & Plan:  Dyspnea Agree that he needs pulmonary evaluation.  He is been treated for his coronary artery disease.  He still has some intermittent chest pain which is concerning for breakthrough angina.  He is a never smoker, but all the same he needs pulmonary function testing to assess for obstructive lung disease.  Chest x-ray was reassuring.  We will perform full pulmonary function testing Your chest x-ray was reviewed and there was no significant  abnormality seen.   Depending on your pulmonary function testing we will decide whether any further pulmonary evaluation is necessary including possible cardiopulmonary exercise testing. Please continue your medications as prescribed by Drs Einar Gip and Laurann Montana. Follow with Dr Lamonte Sakai next available with Full PFT  Snoring We will discuss your sleep symptoms further when you follow-up.  Do not cancel your sleep testing at this time.  Baltazar Apo, MD, PhD 01/29/2018, 5:04 PM [facility] [phone] or if no answer [phone]

## 2018-01-29 NOTE — Assessment & Plan Note (Signed)
Agree that he needs pulmonary evaluation.  He is been treated for his coronary artery disease.  He still has some intermittent chest pain which is concerning for breakthrough angina.  He is a never smoker, but all the same he needs pulmonary function testing to assess for obstructive lung disease.  Chest x-ray was reassuring.  We will perform full pulmonary function testing Your chest x-ray was reviewed and there was no significant abnormality seen.   Depending on your pulmonary function testing we will decide whether any further pulmonary evaluation is necessary including possible cardiopulmonary exercise testing. Please continue your medications as prescribed by Drs Einar Gip and Laurann Montana. Follow with Dr Lamonte Sakai next available with Full PFT

## 2018-02-15 ENCOUNTER — Encounter: Payer: Self-pay | Admitting: Emergency Medicine

## 2018-02-15 ENCOUNTER — Ambulatory Visit: Payer: BLUE CROSS/BLUE SHIELD | Admitting: Emergency Medicine

## 2018-02-15 ENCOUNTER — Ambulatory Visit (INDEPENDENT_AMBULATORY_CARE_PROVIDER_SITE_OTHER): Payer: BLUE CROSS/BLUE SHIELD | Admitting: Emergency Medicine

## 2018-02-15 DIAGNOSIS — R0602 Shortness of breath: Secondary | ICD-10-CM

## 2018-02-15 DIAGNOSIS — Z23 Encounter for immunization: Secondary | ICD-10-CM | POA: Diagnosis not present

## 2018-02-15 DIAGNOSIS — R0683 Snoring: Secondary | ICD-10-CM

## 2018-02-15 LAB — PULMONARY FUNCTION TEST
DL/VA % pred: 88 %
DL/VA: 4.04 ml/min/mmHg/L
DLCO UNC % PRED: 72 %
DLCO unc: 22.41 ml/min/mmHg
FEF 25-75 PRE: 2.61 L/s
FEF 25-75 Post: 3.39 L/sec
FEF2575-%CHANGE-POST: 29 %
FEF2575-%PRED-PRE: 90 %
FEF2575-%Pred-Post: 117 %
FEV1-%Change-Post: 4 %
FEV1-%PRED-POST: 88 %
FEV1-%Pred-Pre: 84 %
FEV1-POST: 3.1 L
FEV1-PRE: 2.95 L
FEV1FVC-%CHANGE-POST: 3 %
FEV1FVC-%Pred-Pre: 103 %
FEV6-%Change-Post: -1 %
FEV6-%Pred-Post: 83 %
FEV6-%Pred-Pre: 84 %
FEV6-Post: 3.68 L
FEV6-Pre: 3.73 L
FEV6FVC-%Change-Post: 0 %
FEV6FVC-%PRED-POST: 105 %
FEV6FVC-%Pred-Pre: 104 %
FVC-%CHANGE-POST: 1 %
FVC-%Pred-Post: 82 %
FVC-%Pred-Pre: 81 %
FVC-PRE: 3.76 L
FVC-Post: 3.81 L
POST FEV6/FVC RATIO: 100 %
PRE FEV1/FVC RATIO: 78 %
Post FEV1/FVC ratio: 81 %
Pre FEV6/FVC Ratio: 99 %
RV % pred: 35 %
RV: 0.79 L
TLC % pred: 68 %
TLC: 4.63 L

## 2018-02-15 NOTE — Progress Notes (Signed)
Subjective:    Patient ID: Travis Fletcher., male    DOB: 1956-03-15, 62 y.o.   MRN: [id number]  Shortness of Breath  Pertinent negatives include no ear pain, fever, headaches, leg swelling, rash, rhinorrhea, sore throat, vomiting or wheezing.   62 year old man, never smoker, with a history of coronary artery disease (PTCI, recent catheterization 06/26/2017, medical therapy), hypercholesterolemia, insulin-dependent diabetes, prostate CA (seed implants).   He is referred by Dr Laurann Montana for evaluation of exertional dyspnea and chest discomfort.  He had CP progressing in 1/19, prompted cath that showed a ruptured plaque that could not be intervened upon, treated medically Continued to have CP, dyspnea that occurs at any time with rest or exertion. He is able to do some work, but limited. Over about 6 weeks has had a new cough, minimally productive.  Wt stable. Rare GERD sx.   He snores, may have had witness apneas. He sleeps on couch, has shoulder pain that wakes him from sleep. Feels well rested during the day.   No family hx lung dz  ROV 02/15/18 --this is a follow-up visit for evaluation of dyspnea.  Patient has a history of coronary disease with PTCI on medical therapy, prostate cancer, insulin-dependent diabetes.  Also with a history of snoring and possible witnessed apneas.  He underwent pulmonary function testing today that I have reviewed.  This shows grossly normal airflows without a bronchodilator response, there may be some mild curve to his flow volume loop that could suggest mild obstruction.  His lung volumes show restriction, question some degree of mixed disease on the spirometry based on this. He also had some improvement in his FEF 25-75%.  He has a slightly decreased diffusion capacity that corrects to the normal range when adjusted for his alveolar volume.   He is still having dry cough.     Review of Systems  Constitutional: Negative for fever and unexpected weight  change.  HENT: Negative for congestion, dental problem, ear pain, nosebleeds, postnasal drip, rhinorrhea, sinus pressure, sneezing, sore throat and trouble swallowing.   Eyes: Negative for redness and itching.  Respiratory: Positive for cough, chest tightness and shortness of breath. Negative for wheezing.   Cardiovascular: Negative for palpitations and leg swelling.  Gastrointestinal: Negative for nausea and vomiting.  Genitourinary: Negative for dysuria.  Musculoskeletal: Negative for joint swelling.  Skin: Negative for rash.  Neurological: Negative for headaches.  Hematological: Does not bruise/bleed easily.  Psychiatric/Behavioral: Negative for dysphoric mood. The patient is not nervous/anxious.    Past Medical History:  Diagnosis Date  . Anginal pain ([place])   . Arthritis    "hands" (07/11/2014)  . Coronary artery disease   . High cholesterol   . Insulin dependent diabetes mellitus ([place])   . Kidney stones    "passed them all"  . Neuromuscular disorder ([facility])    neuropathy  . Prostate cancer ([place]) 2010     No family history on file.   Social History   Socioeconomic History  . Marital status: Married    Spouse name: Not on file  . Number of children: Not on file  . Years of education: Not on file  . Highest education level: Not on file  Occupational History  . Not on file  Social Needs  . Financial resource strain: Not on file  . Food insecurity:    Worry: Not on file    Inability: Not on file  . Transportation needs:    Medical: Not on file  Non-medical: Not on file  Tobacco Use  . Smoking status: Never Smoker  . Smokeless tobacco: Never Used  Substance and Sexual Activity  . Alcohol use: Yes    Comment: 07/11/2014 "0-12 beers/wk; glass of wine when I want; nothing regular"  . Drug use: No  . Sexual activity: Not Currently  Lifestyle  . Physical activity:    Days per week: Not on file    Minutes per session: Not on file  . Stress: Not on file  Relationships   . Social connections:    Talks on phone: Not on file    Gets together: Not on file    Attends religious service: Not on file    Active member of club or organization: Not on file    Attends meetings of clubs or organizations: Not on file    Relationship status: Not on file  . Intimate partner violence:    Fear of current or ex partner: Not on file    Emotionally abused: Not on file    Physically abused: Not on file    Forced sexual activity: Not on file  Other Topics Concern  . Not on file  Social History Narrative  . 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 Brilinta [Ticagrelor]   . Statins      Outpatient Medications Prior to Visit  Medication Sig Dispense Refill  . aspirin 81 MG tablet Take 1 tablet (81 mg total) by mouth daily.    . clopidogrel (PLAVIX) 75 MG tablet Take 75 mg by mouth daily.    . insulin aspart (NOVOLOG) 100 UNIT/ML injection Inject into the skin See admin instructions. Uses via Insulin Pump    . losartan (COZAAR) 25 MG tablet Take 12.5 mg by mouth daily.    . metoprolol tartrate (LOPRESSOR) 25 MG tablet Take 12.5 mg by mouth 2 (two) times daily.    . Multiple Vitamins-Minerals (CENTRUM SILVER 50+MEN PO) Take 1 tablet by mouth daily.    . nitroGLYCERIN (NITROSTAT) 0.4 MG SL tablet Place 1 tablet (0.4 mg total) under the tongue every 5 (five) minutes as needed for chest pain. 30 tablet 6  . REPATHA SURECLICK 979 MG/ML SOAJ Inject 140 mg into the muscle every 14 (fourteen) days.   1  . vitamin B-12 (CYANOCOBALAMIN) 100 MCG tablet Take 100 mcg by mouth daily.     No facility-administered medications prior to visit.         Objective:   Physical Exam Vitals:   02/15/18 1557  BP: 130/64  Pulse: 97  SpO2: 96%  Weight: 206 lb (93.4 kg)  Height: 5\' 9"  (1.753 m)   Gen: Pleasant, well-nourished, in no distress,   normal affect  ENT: No lesions,  mouth clear,  oropharynx clear, no postnasal drip  Neck: No JVD, no stridor  Lungs: No use of accessory muscles, no wheeze or crackles  Cardiovascular: RRR, heart sounds normal, no murmur or gallops, no peripheral edema  Musculoskeletal: No deformities, no cyanosis or clubbing, no chest tenderness  Neuro: alert, non focal  Skin: Warm, no lesions or rash     Assessment & Plan:  Snoring Is planning for a sleep evaluation with Dr. Brett Fairy in October.  Dyspnea Pulmonary function testing today was reassuring.  There may be some very mild evidence for obstruction based on some curve of his flow volume loop,  a change in his FEF 25 to 75% with bronchodilator.  I do not believe there is enough here to explain exertional dyspnea or to merit bronchodilator therapy at this time.  I explained to him that the clinical relevance of this mild obstruction might increase over time and he may in the future need to be treated.  He will let me know if his breathing changes.  Your pulmonary function testing today is overall normal.  There may be some evidence for some slight abnormal airflow but I do not believe this is influencing your breathing at this time.  If your breathing changes going forward we may decide that it would be beneficial to try treating with inhaled medication. You  may want to try using an over-the-counter antihistamine for 2 to 3 weeks to see if this helps your cough. If your cough continues you might also want to speak with Dr. Einar Gip about changing your losartan to an alternative. Flu shot today. Follow with Dr Lamonte Sakai as needed for any changes in your breathing  Baltazar Apo, MD, PhD 02/15/2018, 4:39 PM [facility] [phone] or if no answer [phone]

## 2018-02-15 NOTE — Assessment & Plan Note (Signed)
Pulmonary function testing today was reassuring.  There may be some very mild evidence for obstruction based on some curve of his flow volume loop, a change in his FEF 25 to 75% with bronchodilator.  I do not believe there is enough here to explain exertional dyspnea or to merit bronchodilator therapy at this time.  I explained to him that the clinical relevance of this mild obstruction might increase over time and he may in the future need to be treated.  He will let me know if his breathing changes.  Your pulmonary function testing today is overall normal.  There may be some evidence for some slight abnormal airflow but I do not believe this is influencing your breathing at this time.  If your breathing changes going forward we may decide that it would be beneficial to try treating with inhaled medication. You  may want to try using an over-the-counter antihistamine for 2 to 3 weeks to see if this helps your cough. If your cough continues you might also want to speak with Dr. Einar Gip about changing your losartan to an alternative. Flu shot today. Follow with Dr Lamonte Sakai as needed for any changes in your breathing

## 2018-02-15 NOTE — Progress Notes (Signed)
Patient completed full PFT today. 

## 2018-02-15 NOTE — Assessment & Plan Note (Signed)
Is planning for a sleep evaluation with Dr. Brett Fairy in October.

## 2018-02-15 NOTE — Patient Instructions (Addendum)
Your pulmonary function testing today is overall normal.  There may be some evidence for some slight abnormal airflow but I do not believe this is influencing your breathing at this time.  If your breathing changes going forward we may decide that it would be beneficial to try treating with inhaled medication. You  may want to try using an over-the-counter antihistamine for 2 to 3 weeks to see if this helps your cough. If your cough continues you might also want to speak with Dr. Einar Gip about changing your losartan to an alternative. Flu shot today. Follow with Dr Lamonte Sakai as needed for any changes in your breathing

## 2018-03-02 ENCOUNTER — Encounter: Payer: Self-pay | Admitting: Neurology

## 2018-03-10 ENCOUNTER — Institutional Professional Consult (permissible substitution): Payer: BLUE CROSS/BLUE SHIELD | Admitting: Neurology

## 2018-03-17 DIAGNOSIS — E109 Type 1 diabetes mellitus without complications: Secondary | ICD-10-CM | POA: Diagnosis not present

## 2018-03-17 DIAGNOSIS — E108 Type 1 diabetes mellitus with unspecified complications: Secondary | ICD-10-CM | POA: Diagnosis not present

## 2018-03-17 DIAGNOSIS — Z794 Long term (current) use of insulin: Secondary | ICD-10-CM | POA: Diagnosis not present

## 2018-03-17 DIAGNOSIS — Z9641 Presence of insulin pump (external) (internal): Secondary | ICD-10-CM | POA: Diagnosis not present

## 2018-03-24 DIAGNOSIS — B078 Other viral warts: Secondary | ICD-10-CM | POA: Diagnosis not present

## 2018-04-06 DIAGNOSIS — Z794 Long term (current) use of insulin: Secondary | ICD-10-CM | POA: Diagnosis not present

## 2018-04-06 DIAGNOSIS — E1065 Type 1 diabetes mellitus with hyperglycemia: Secondary | ICD-10-CM | POA: Diagnosis not present

## 2018-06-30 DIAGNOSIS — E108 Type 1 diabetes mellitus with unspecified complications: Secondary | ICD-10-CM | POA: Diagnosis not present

## 2018-06-30 DIAGNOSIS — Z794 Long term (current) use of insulin: Secondary | ICD-10-CM | POA: Diagnosis not present

## 2018-06-30 DIAGNOSIS — E109 Type 1 diabetes mellitus without complications: Secondary | ICD-10-CM | POA: Diagnosis not present

## 2018-06-30 DIAGNOSIS — Z9641 Presence of insulin pump (external) (internal): Secondary | ICD-10-CM | POA: Diagnosis not present

## 2018-07-22 DIAGNOSIS — E1065 Type 1 diabetes mellitus with hyperglycemia: Secondary | ICD-10-CM | POA: Diagnosis not present

## 2018-07-22 DIAGNOSIS — Z794 Long term (current) use of insulin: Secondary | ICD-10-CM | POA: Diagnosis not present

## 2018-07-24 ENCOUNTER — Encounter: Payer: Self-pay | Admitting: Cardiology

## 2018-07-24 DIAGNOSIS — E1065 Type 1 diabetes mellitus with hyperglycemia: Secondary | ICD-10-CM | POA: Insufficient documentation

## 2018-07-24 DIAGNOSIS — I2510 Atherosclerotic heart disease of native coronary artery without angina pectoris: Secondary | ICD-10-CM

## 2018-07-24 DIAGNOSIS — E7800 Pure hypercholesterolemia, unspecified: Secondary | ICD-10-CM

## 2018-07-24 DIAGNOSIS — IMO0002 Reserved for concepts with insufficient information to code with codable children: Secondary | ICD-10-CM | POA: Insufficient documentation

## 2018-07-24 DIAGNOSIS — E1165 Type 2 diabetes mellitus with hyperglycemia: Secondary | ICD-10-CM | POA: Insufficient documentation

## 2018-07-24 DIAGNOSIS — Z789 Other specified health status: Secondary | ICD-10-CM

## 2018-07-24 HISTORY — DX: Atherosclerotic heart disease of native coronary artery without angina pectoris: I25.10

## 2018-07-24 HISTORY — DX: Pure hypercholesterolemia, unspecified: E78.00

## 2018-07-24 HISTORY — DX: Other specified health status: Z78.9

## 2018-07-24 NOTE — Progress Notes (Signed)
Subjective:  Primary Physician:  Kelton Pillar, MD  Patient ID: Travis Fletcher., male    DOB: 24-Feb-1956, 63 y.o.   MRN: [id number]  Chief Complaint  Patient presents with  . Coronary Artery Disease    6 month F/U    HPI: Eliam "Travis Fletcher" Jomel Whittlesey.  is a 63 y.o. male with known coronary artery disease, has mid LAD stent placed on 06/15/2015 with 3 x 26 mm resolute stent, balloon angioplasty to ramus intermediate on 07/11/2014 and mid circumflex stent with a 4.0 x 15 mm integrity non-DES stent in 2012. He presented on 06/24/2016 with symptoms of unstable angina pectoris, coronary angiography on 06/26/2017 revealed an ulcerated 95% stenosis of the apical LAD and about a 50-60% stenosis of the distal LAD and previously performed angioplasty sites were widely patent. Medical therapy was recommended. He has a history of hyperlipidemia with statin intolerance now on Repatha, uncontrolled type I diabetes follows Dr Dagmar Hait.  6 months ago he continued to have marked fatigue and dyspnea and also occasional episodes of chest pain, pulmonary evaluation was performed which revealed possibility of mild bronchial asthma.  However since September 2019, he has recuperated well and he started Ranexa back and has not had any recurrence of chest pain.  Presently doing well and essentially asymptomatic.  Past Medical History:  Diagnosis Date  . Anginal pain ([place])   . Arthritis    "hands" (07/11/2014)  . CAD (coronary artery disease), native coronary artery 07/24/2018  . Coronary artery disease   . High cholesterol   . Hypercholesteremia 07/24/2018  . Insulin dependent diabetes mellitus ([place])   . Kidney stones    "passed them all"  . Neuromuscular disorder (HCC)    neuropathy  . Prostate cancer ([place]) 2010  . Statin intolerance 07/24/2018  . Uncontrolled diabetes mellitus ([place]) 07/24/2018    Past Surgical History:  Procedure Laterality Date  . BALLOON ANGIOPLASTY, ARTERY  07/13/2012    . CARDIAC CATHETERIZATION N/A 06/15/2015   Procedure: Left Heart Cath and Coronary Angiography;  Surgeon: Adrian Prows, MD;  Location: [facility];  Service: Cardiovascular;  Laterality: N/A;  . CARDIAC CATHETERIZATION N/A 06/15/2015   Procedure: Coronary Stent Intervention;  Surgeon: Adrian Prows, MD;  Location: [facility];  Service: Cardiovascular;  Laterality: N/A;  . CATARACT EXTRACTION W/ INTRAOCULAR LENS  IMPLANT, BILATERAL Bilateral ~ 2000  . CORONARY ANGIOPLASTY  07/11/2014  . CORONARY ANGIOPLASTY WITH STENT PLACEMENT  2012   "1"  . EYE SURGERY    . INGUINAL HERNIA REPAIR Left 1987  . INSERTION PROSTATE RADIATION SEED  2010  . LEFT HEART CATH AND CORONARY ANGIOGRAPHY N/A 06/26/2017   Procedure: LEFT HEART CATH AND CORONARY ANGIOGRAPHY;  Surgeon: Adrian Prows, MD;  Location: [facility];  Service: Cardiovascular;  Laterality: N/A;  . LEFT HEART CATHETERIZATION WITH CORONARY ANGIOGRAM N/A 07/13/2012   Procedure: LEFT HEART CATHETERIZATION WITH CORONARY ANGIOGRAM;  Surgeon: Laverda Page, MD;  Location: [facility] C[facility];  Service: Cardiovascular;  Laterality: N/A;  . LEFT HEART CATHETERIZATION WITH CORONARY ANGIOGRAM N/A 07/11/2014   Procedure: LEFT HEART CATHETERIZATION WITH CORONARY ANGIOGRAM;  Surgeon: Laverda Page, MD;  Location[facility] C[facility];  Service: Cardiovascular;  Laterality: N/A;  . RETINAL DETACHMENT SURGERY Left 2010  . RETINAL LASER PROCEDURE Right 2010  . THYROIDECTOMY, PARTIAL  ~ 1979  . TONSILLECTOMY AND ADENOIDECTOMY  1977    Social History   Socioeconomic History  . Marital status: Married  Spouse name: Not on file  . Number of children: 2  . Years of education: Not on file  . Highest education level: Not on file  Occupational History  . Not on file  Social Needs  . Financial resource strain: Not on file  . Food insecurity:    Worry: Not on file    Inability: Not on file  . Transportation needs:    Medical: Not on file    Non-medical: Not on  file  Tobacco Use  . Smoking status: Never Smoker  . Smokeless tobacco: Never Used  Substance and Sexual Activity  . Alcohol use: Yes    Comment: 2-3 beers per week  . Drug use: No  . Sexual activity: Not Currently  Lifestyle  . Physical activity:    Days per week: Not on file    Minutes per session: Not on file  . Stress: Not on file  Relationships  . Social connections:    Talks on phone: Not on file    Gets together: Not on file    Attends religious service: Not on file    Active member of club or organization: Not on file    Attends meetings of clubs or organizations: Not on file    Relationship status: Not on file  . Intimate partner violence:    Fear of current or ex partner: Not on file    Emotionally abused: Not on file    Physically abused: Not on file    Forced sexual activity: Not on file  Other Topics Concern  . Not on file  Social History Narrative  . Not on file    Current Outpatient Medications on File Prior to Visit  Medication Sig Dispense Refill  . aspirin 81 MG tablet Take 1 tablet (81 mg total) by mouth daily.    . insulin aspart (NOVOLOG) 100 UNIT/ML injection Inject into the skin See admin instructions. Uses via Insulin Pump    . losartan (COZAAR) 25 MG tablet Take 12.5 mg by mouth daily.    . metoprolol tartrate (LOPRESSOR) 25 MG tablet Take 12.5 mg by mouth 2 (two) times daily.    . Multiple Vitamins-Minerals (CENTRUM SILVER 50+MEN PO) Take 1 tablet by mouth daily.    . nitroGLYCERIN (NITROSTAT) 0.4 MG SL tablet Place 1 tablet (0.4 mg total) under the tongue every 5 (five) minutes as needed for chest pain. 30 tablet 6  . ranolazine (RANEXA) 500 MG 12 hr tablet Take 1 tablet by mouth 2 (two) times daily.    Marland Kitchen REPATHA SURECLICK 381 MG/ML SOAJ Inject 140 mg into the muscle every 14 (fourteen) days.   1  . vitamin B-12 (CYANOCOBALAMIN) 100 MCG tablet Take 100 mcg by mouth daily.    . clopidogrel (PLAVIX) 75 MG tablet Take 75 mg by mouth daily.     No  current facility-administered medications on file prior to visit.    Not taking Plavix  Review of Systems  Constitutional: Negative for weight loss.  Respiratory: Negative for cough and hemoptysis. Sputum production: stable.   Cardiovascular: Negative for chest pain, palpitations, claudication and leg swelling.  Gastrointestinal: Negative for abdominal pain, blood in stool, constipation, heartburn and vomiting.  Genitourinary: Negative for dysuria.  Musculoskeletal: Positive for back pain. Negative for joint pain and myalgias.  Neurological: Negative for dizziness, focal weakness and headaches.  Endo/Heme/Allergies: Does not bruise/bleed easily.  Psychiatric/Behavioral: Negative for depression. The patient is not nervous/anxious.   All other systems reviewed and are negative.  Objective:  Blood pressure 126/71, pulse 80, height 5' 9"  (1.753 m), weight 203 lb (92.1 kg), SpO2 97 %. Body mass index is 29.98 kg/m.   Physical Exam  Constitutional: He appears well-developed and well-nourished. No distress.  HENT:  Head: Atraumatic.  Eyes: Conjunctivae are normal.  Neck: Neck supple. No JVD present. No thyromegaly present.  Cardiovascular: Normal rate, regular rhythm, normal heart sounds and intact distal pulses. Exam reveals no gallop.  No murmur heard. Pulmonary/Chest: Effort normal and breath sounds normal.  Abdominal: Soft. Bowel sounds are normal.  Reducible ventral hernia  Musculoskeletal: Normal range of motion.        General: No edema.  Neurological: He is alert.  Skin: Skin is warm and dry.  Psychiatric: He has a normal mood and affect.    CARDIAC STUDIES:  Lexiscan Sestamibi stress test 09/04/2017:  1. Pharmacologic stress testing was performed with intravenous administration of .4 mg of Lexiscan over a 10-15 seconds infusion. Stress symptoms included dyspnea and abdominal pain. Normal blood pressure. Exercise capacity not assessed. Stress EKG is non diagnostic for  ischemia as it is a pharmacologic stress.  2. The overall quality of the study is excellent. There is no evidence of abnormal lung activity. Stress and rest SPECT images demonstrate homogeneous tracer distribution throughout the myocardium. Gated SPECT imaging reveals normal myocardial thickening and wall motion. The left ventricular ejection fraction was normal (81%).   3. Low risk study.  Echo- 08/27/2017 1. Left ventricle cavity is normal in size. Mild concentric hypertrophy of the left ventricle. Normal global wall motion. Visual EF is 55-60%. Calculated EF 55%. 2. Trileaflet aortic valve with no regurgitation noted. Mild aortic valve leaflet thickening. 3. Mild (Grade I) mitral regurgitation. 4. Mild tricuspid regurgitation. No evidence of pulmonary hypertension. 5. Mildly dilated ascending aorta, measures 3.5 cm.  Assessment & Recommendations:   1. Coronary artery disease of native artery of native heart with stable angina pectoris ([place])          Coronary angiogram 06/26/2017: Apical LAD 95% ulcerated stenosis. Patent Mid LAD 3.0x22 Resolute DES ( 06/15/2015), Balloon angioplasty site at PDA 2014 & Ramus intermediate 2012 widely patent. Mid circumflex 4.0 x 15 mm integrity non-DES stent (01/06/2011) widely patent. Normal LVEF. EKG 07/26/2018: Normal sinus rhythm at rate of 78 bpm, normal axis.  No evidence of ischemia, normal EKG. 2. Uncontrolled type 2 diabetes mellitus with hyperglycemia (HCC) with diabetic retinopathy  3. Hypercholesteremia Controlled and will check lipids in 6 months.  4. Statin intolerance Presently on Repatha  5. Laboratory exam: 01/18/2018: Vitamin D normal at 43.  Labs 12/16/2017: Total cholesterol 109, triglycerides 110, HDL 52, LDL 56.  TSH normal.  A1c 7.7%.  Serum glucose 174, BUN 15, creatinine 0.85, which eGFR greater than 60 ML, CMP otherwise normal.  CBC normal.  Recommendation: Presently doing well, hypertension symptoms improved on Ranexa.  We will  continue present medications, I'll see him back in 6 months.  We will obtain labs prior to his office visit. He has discontinue Plavix, I do not see indication for dual antiplatelet therapy for now.  He did this as he was having cough.  Adrian Prows, MD, [facility] 07/26/2018, 10:02 AM [facility] [place] Pager: [phone] Office: [phone] If no answer Cell [phone]

## 2018-07-26 ENCOUNTER — Ambulatory Visit: Payer: Self-pay | Admitting: Cardiology

## 2018-07-26 ENCOUNTER — Encounter: Payer: Self-pay | Admitting: Cardiology

## 2018-07-26 DIAGNOSIS — E7800 Pure hypercholesterolemia, unspecified: Secondary | ICD-10-CM | POA: Diagnosis not present

## 2018-07-26 DIAGNOSIS — E1165 Type 2 diabetes mellitus with hyperglycemia: Secondary | ICD-10-CM

## 2018-07-26 DIAGNOSIS — Z789 Other specified health status: Secondary | ICD-10-CM | POA: Diagnosis not present

## 2018-07-26 DIAGNOSIS — Z23 Encounter for immunization: Secondary | ICD-10-CM | POA: Diagnosis not present

## 2018-07-26 DIAGNOSIS — I25118 Atherosclerotic heart disease of native coronary artery with other forms of angina pectoris: Secondary | ICD-10-CM | POA: Diagnosis not present

## 2018-07-26 DIAGNOSIS — Z0000 Encounter for general adult medical examination without abnormal findings: Secondary | ICD-10-CM | POA: Diagnosis not present

## 2018-07-28 ENCOUNTER — Other Ambulatory Visit: Payer: Self-pay | Admitting: Cardiology

## 2018-08-03 DIAGNOSIS — C44619 Basal cell carcinoma of skin of left upper limb, including shoulder: Secondary | ICD-10-CM | POA: Diagnosis not present

## 2018-08-03 DIAGNOSIS — Z85828 Personal history of other malignant neoplasm of skin: Secondary | ICD-10-CM | POA: Diagnosis not present

## 2018-08-03 DIAGNOSIS — Z794 Long term (current) use of insulin: Secondary | ICD-10-CM | POA: Diagnosis not present

## 2018-08-03 DIAGNOSIS — E108 Type 1 diabetes mellitus with unspecified complications: Secondary | ICD-10-CM | POA: Diagnosis not present

## 2018-08-03 DIAGNOSIS — E109 Type 1 diabetes mellitus without complications: Secondary | ICD-10-CM | POA: Diagnosis not present

## 2018-08-03 DIAGNOSIS — D1801 Hemangioma of skin and subcutaneous tissue: Secondary | ICD-10-CM | POA: Diagnosis not present

## 2018-08-03 DIAGNOSIS — Z9641 Presence of insulin pump (external) (internal): Secondary | ICD-10-CM | POA: Diagnosis not present

## 2018-08-03 DIAGNOSIS — L570 Actinic keratosis: Secondary | ICD-10-CM | POA: Diagnosis not present

## 2018-08-03 DIAGNOSIS — L814 Other melanin hyperpigmentation: Secondary | ICD-10-CM | POA: Diagnosis not present

## 2018-08-03 DIAGNOSIS — L821 Other seborrheic keratosis: Secondary | ICD-10-CM | POA: Diagnosis not present

## 2018-08-10 DIAGNOSIS — C44619 Basal cell carcinoma of skin of left upper limb, including shoulder: Secondary | ICD-10-CM | POA: Diagnosis not present

## 2018-09-09 DIAGNOSIS — Z794 Long term (current) use of insulin: Secondary | ICD-10-CM | POA: Diagnosis not present

## 2018-09-09 DIAGNOSIS — E109 Type 1 diabetes mellitus without complications: Secondary | ICD-10-CM | POA: Diagnosis not present

## 2018-09-09 DIAGNOSIS — E108 Type 1 diabetes mellitus with unspecified complications: Secondary | ICD-10-CM | POA: Diagnosis not present

## 2018-09-09 DIAGNOSIS — Z9641 Presence of insulin pump (external) (internal): Secondary | ICD-10-CM | POA: Diagnosis not present

## 2018-10-19 DIAGNOSIS — Z794 Long term (current) use of insulin: Secondary | ICD-10-CM | POA: Diagnosis not present

## 2018-10-19 DIAGNOSIS — E109 Type 1 diabetes mellitus without complications: Secondary | ICD-10-CM | POA: Diagnosis not present

## 2018-10-19 DIAGNOSIS — Z9641 Presence of insulin pump (external) (internal): Secondary | ICD-10-CM | POA: Diagnosis not present

## 2018-10-19 DIAGNOSIS — E108 Type 1 diabetes mellitus with unspecified complications: Secondary | ICD-10-CM | POA: Diagnosis not present

## 2018-11-05 DIAGNOSIS — H33103 Unspecified retinoschisis, bilateral: Secondary | ICD-10-CM | POA: Diagnosis not present

## 2018-11-05 DIAGNOSIS — E113592 Type 2 diabetes mellitus with proliferative diabetic retinopathy without macular edema, left eye: Secondary | ICD-10-CM | POA: Diagnosis not present

## 2018-11-05 DIAGNOSIS — Z961 Presence of intraocular lens: Secondary | ICD-10-CM | POA: Diagnosis not present

## 2018-11-05 DIAGNOSIS — E113291 Type 2 diabetes mellitus with mild nonproliferative diabetic retinopathy without macular edema, right eye: Secondary | ICD-10-CM | POA: Diagnosis not present

## 2018-11-30 DIAGNOSIS — E108 Type 1 diabetes mellitus with unspecified complications: Secondary | ICD-10-CM | POA: Diagnosis not present

## 2018-11-30 DIAGNOSIS — Z9641 Presence of insulin pump (external) (internal): Secondary | ICD-10-CM | POA: Diagnosis not present

## 2018-11-30 DIAGNOSIS — E109 Type 1 diabetes mellitus without complications: Secondary | ICD-10-CM | POA: Diagnosis not present

## 2018-11-30 DIAGNOSIS — Z794 Long term (current) use of insulin: Secondary | ICD-10-CM | POA: Diagnosis not present

## 2019-01-05 ENCOUNTER — Telehealth: Payer: Self-pay

## 2019-01-05 NOTE — Telephone Encounter (Signed)
If ongoing chest pain, should go to the ED.  Thanks MJP

## 2019-01-05 NOTE — Telephone Encounter (Signed)
Pt aware and he will call back if he feels like he needs to make an appointment

## 2019-01-05 NOTE — Telephone Encounter (Signed)
Telephone encounter:  Reason for call: Chest pain last few days, last night flutter in chest, this morning he is not feeling good, this is reminding him of stuff he had in the past and is concerned   Usual provider: [facility] office visit: 07/26/2018  Next office visit: 01/31/2019   Last hospitalization: 06/26/2017   Current Outpatient Medications on File Prior to Visit  Medication Sig Dispense Refill  . aspirin 81 MG tablet Take 1 tablet (81 mg total) by mouth daily.    . clopidogrel (PLAVIX) 75 MG tablet Take 75 mg by mouth daily.    . insulin aspart (NOVOLOG) 100 UNIT/ML injection Inject into the skin See admin instructions. Uses via Insulin Pump    . losartan (COZAAR) 25 MG tablet TAKE 1/2 TABLET BY MOUTH DAILY 90 tablet 3  . metoprolol tartrate (LOPRESSOR) 25 MG tablet Take 12.5 mg by mouth 2 (two) times daily.    . Multiple Vitamins-Minerals (CENTRUM SILVER 50+MEN PO) Take 1 tablet by mouth daily.    . nitroGLYCERIN (NITROSTAT) 0.4 MG SL tablet Place 1 tablet (0.4 mg total) under the tongue every 5 (five) minutes as needed for chest pain. 30 tablet 6  . ranolazine (RANEXA) 500 MG 12 hr tablet Take 1 tablet by mouth 2 (two) times daily.    Marland Kitchen REPATHA SURECLICK 480 MG/ML SOAJ Inject 140 mg into the muscle every 14 (fourteen) days.   1  . vitamin B-12 (CYANOCOBALAMIN) 100 MCG tablet Take 100 mcg by mouth daily.     No current facility-administered medications on file prior to visit.

## 2019-01-10 ENCOUNTER — Telehealth: Payer: Self-pay

## 2019-01-10 DIAGNOSIS — E108 Type 1 diabetes mellitus with unspecified complications: Secondary | ICD-10-CM | POA: Diagnosis not present

## 2019-01-10 DIAGNOSIS — Z794 Long term (current) use of insulin: Secondary | ICD-10-CM | POA: Diagnosis not present

## 2019-01-10 DIAGNOSIS — Z9641 Presence of insulin pump (external) (internal): Secondary | ICD-10-CM | POA: Diagnosis not present

## 2019-01-10 DIAGNOSIS — E109 Type 1 diabetes mellitus without complications: Secondary | ICD-10-CM | POA: Diagnosis not present

## 2019-01-10 NOTE — Telephone Encounter (Signed)
Telephone encounter: Pt wants you to call him regarding his issue he had last week with the fluttering in his chest per MP he was to go to the hospital if continues and he doesn't want MP advice   Reason for call: fluttering in chest   Usual provider: [facility] office visit: 07/26/2018  Next office visit: 01/31/2019   Last hospitalization:    Current Outpatient Medications on File Prior to Visit  Medication Sig Dispense Refill  . aspirin 81 MG tablet Take 1 tablet (81 mg total) by mouth daily.    . clopidogrel (PLAVIX) 75 MG tablet Take 75 mg by mouth daily.    . insulin aspart (NOVOLOG) 100 UNIT/ML injection Inject into the skin See admin instructions. Uses via Insulin Pump    . losartan (COZAAR) 25 MG tablet TAKE 1/2 TABLET BY MOUTH DAILY 90 tablet 3  . metoprolol tartrate (LOPRESSOR) 25 MG tablet Take 12.5 mg by mouth 2 (two) times daily.    . Multiple Vitamins-Minerals (CENTRUM SILVER 50+MEN PO) Take 1 tablet by mouth daily.    . nitroGLYCERIN (NITROSTAT) 0.4 MG SL tablet Place 1 tablet (0.4 mg total) under the tongue every 5 (five) minutes as needed for chest pain. 30 tablet 6  . ranolazine (RANEXA) 500 MG 12 hr tablet Take 1 tablet by mouth 2 (two) times daily.    Marland Kitchen REPATHA SURECLICK 878 MG/ML SOAJ Inject 140 mg into the muscle every 14 (fourteen) days.   1  . vitamin B-12 (CYANOCOBALAMIN) 100 MCG tablet Take 100 mcg by mouth daily.     No current facility-administered medications on file prior to visit.

## 2019-01-11 NOTE — Telephone Encounter (Signed)
From pt

## 2019-01-13 ENCOUNTER — Ambulatory Visit: Payer: BLUE CROSS/BLUE SHIELD | Admitting: Cardiology

## 2019-01-13 ENCOUNTER — Encounter: Payer: Self-pay | Admitting: Cardiology

## 2019-01-13 ENCOUNTER — Other Ambulatory Visit: Payer: Self-pay

## 2019-01-13 VITALS — BP 134/71 | HR 89 | Ht 69.0 in | Wt 202.0 lb

## 2019-01-13 DIAGNOSIS — R002 Palpitations: Secondary | ICD-10-CM

## 2019-01-13 DIAGNOSIS — E1165 Type 2 diabetes mellitus with hyperglycemia: Secondary | ICD-10-CM | POA: Diagnosis not present

## 2019-01-13 DIAGNOSIS — I25118 Atherosclerotic heart disease of native coronary artery with other forms of angina pectoris: Secondary | ICD-10-CM

## 2019-01-13 DIAGNOSIS — R55 Syncope and collapse: Secondary | ICD-10-CM | POA: Diagnosis not present

## 2019-01-13 MED ORDER — ISOSORBIDE MONONITRATE ER 60 MG PO TB24: 60.0000 mg | ORAL_TABLET | Freq: Every day | ORAL | 1 refills | Status: DC

## 2019-01-13 NOTE — Progress Notes (Signed)
Primary Physician:  Kelton Pillar, MD   Patient ID: Travis Kung., male    DOB: [birth year], 63 y.o.   MRN: [id number]  Subjective:    Chief Complaint  Patient presents with  . Coronary Artery Disease  . Atrial Flutter  . Follow-up    HPI: Travis Glazebrook.  is a 63 y.o. male  with known coronary artery disease, has mid LAD stent placed on 06/15/2015 with 3 x 26 mm resolute stent, balloon angioplasty to ramus intermediate on 07/11/2014 and mid circumflex stent with a 4.0 x 15 mm integrity non-DES stent in 2012. He presented on 06/24/2016 with symptoms of unstable angina pectoris, coronary angiography on 06/26/2017 revealed an ulcerated 95% stenosis of the apical LAD and about a 50-60% stenosis of the distal LAD and previously performed angioplasty sites were widely patent. Medical therapy was recommended. He has a history of hyperlipidemia with statin intolerance now on Repatha, uncontrolled type I diabetes follows Dr Dagmar Hait.  1 year ago he continued to have marked fatigue and dyspnea and also occasional episodes of chest pain, pulmonary evaluation was performed which revealed possibility of mild bronchial asthma.  However since September 2019, he has recuperated well and had improvement in symptoms with Ranexa.   He now presents today for acute visit for chest pain. He reports symptoms started 2 weeks ago, with an episode of near syncope while at work. Improved with drinking lots of water. Did not have chest pain at that time; however, since then, he has had increased frequency of chest pain similar to like he did before. Notices chest pain when he is tired after exertion or with exertion. Can also awaken him from sleep. Also has fatigue and inability to keep up with his job. No recurrence of near syncope. He does mention occasional "fluttering" sensation in his chest lasting for a few minutes. No heart racing. He has taken 1 nitroglycerin that did improve his  symptoms after 1-2 mins.    Past Medical History:  Diagnosis Date  . Anginal pain ([place])   . Arthritis    "hands" (07/11/2014)  . CAD (coronary artery disease), native coronary artery 07/24/2018  . Coronary artery disease   . High cholesterol   . Hypercholesteremia 07/24/2018  . Insulin dependent diabetes mellitus ([place])   . Kidney stones    "passed them all"  . Neuromuscular disorder ([facility])    neuropathy  . Prostate cancer ([place]) 2010  . Statin intolerance 07/24/2018  . Uncontrolled diabetes mellitus ([place]) 07/24/2018    Past Surgical History:  Procedure Laterality Date  . BALLOON ANGIOPLASTY, ARTERY  07/13/2012  . CARDIAC CATHETERIZATION N/A 06/15/2015   Procedure: Left Heart Cath and Coronary Angiography;  Surgeon: Adrian Prows, MD;  Location: [facility];  Service: Cardiovascular;  Laterality: N/A;  . CARDIAC CATHETERIZATION N/A 06/15/2015   Procedure: Coronary Stent Intervention;  Surgeon: Adrian Prows, MD;  Location: [facility];  Service: Cardiovascular;  Laterality: N/A;  . CATARACT EXTRACTION W/ INTRAOCULAR LENS  IMPLANT, BILATERAL Bilateral ~ 2000  . CORONARY ANGIOPLASTY  07/11/2014  . CORONARY ANGIOPLASTY WITH STENT PLACEMENT  2012   "1"  . EYE SURGERY    . INGUINAL HERNIA REPAIR Left 1987  . INSERTION PROSTATE RADIATION SEED  2010  . LEFT HEART CATH AND CORONARY ANGIOGRAPHY N/A 06/26/2017   Procedure: LEFT HEART CATH AND CORONARY ANGIOGRAPHY;  Surgeon: Adrian Prows, MD;  Location: [facility];  Service: Cardiovascular;  Laterality: N/A;  .  LEFT HEART CATHETERIZATION WITH CORONARY ANGIOGRAM N/A 07/13/2012   Procedure: LEFT HEART CATHETERIZATION WITH CORONARY ANGIOGRAM;  Surgeon: Laverda Page, MD;  Location[facility];  Service: Cardiovascular;  Laterality: N/A;  . LEFT HEART CATHETERIZATION WITH CORONARY ANGIOGRAM N/A 07/11/2014   Procedure: LEFT HEART CATHETERIZATION WITH CORONARY ANGIOGRAM;  Surgeon: Laverda Page, MD;  Location: [facility] CATH LAB;  Service:  Cardiovascular;  Laterality: N/A;  . RETINAL DETACHMENT SURGERY Left 2010  . RETINAL LASER PROCEDURE Right 2010  . THYROIDECTOMY, PARTIAL  ~ 1979  . TONSILLECTOMY AND ADENOIDECTOMY  1977    Social History   Socioeconomic History  . Marital status: Married    Spouse name: Not on file  . Number of children: 2  . Years of education: Not on file  . Highest education level: Not on file  Occupational History  . Not on file  Social Needs  . Financial resource strain: Not on file  . Food insecurity    Worry: Not on file    Inability: Not on file  . Transportation needs    Medical: Not on file    Non-medical: Not on file  Tobacco Use  . Smoking status: Never Smoker  . Smokeless tobacco: Never Used  Substance and Sexual Activity  . Alcohol use: Yes    Comment: 2-3 beers per week  . Drug use: No  . Sexual activity: Not Currently  Lifestyle  . Physical activity    Days per week: Not on file    Minutes per session: Not on file  . Stress: Not on file  Relationships  . Social Herbalist on phone: Not on file    Gets together: Not on file    Attends religious service: Not on file    Active member of club or organization: Not on file    Attends meetings of clubs or organizations: Not on file    Relationship status: Not on file  . Intimate partner violence    Fear of current or ex partner: Not on file    Emotionally abused: Not on file    Physically abused: Not on file    Forced sexual activity: Not on file  Other Topics Concern  . Not on file  Social History Narrative  . Not on file    Review of Systems  Constitution: Positive for malaise/fatigue. Negative for decreased appetite, weight gain and weight loss.  Eyes: Negative for visual disturbance.  Cardiovascular: Positive for chest pain, dyspnea on exertion and palpitations. Negative for claudication, leg swelling, orthopnea and syncope.  Respiratory: Negative for hemoptysis and wheezing.   Endocrine: Negative  for cold intolerance and heat intolerance.  Hematologic/Lymphatic: Does not bruise/bleed easily.  Skin: Negative for nail changes.  Musculoskeletal: Negative for muscle weakness and myalgias.  Gastrointestinal: Negative for abdominal pain, change in bowel habit, nausea and vomiting.  Neurological: Negative for difficulty with concentration, dizziness, focal weakness and headaches.  Psychiatric/Behavioral: Negative for altered mental status and suicidal ideas.  All other systems reviewed and are negative.     Objective:  There were no vitals taken for this visit. There is no height or weight on file to calculate BMI.    Physical Exam  Constitutional: He is oriented to person, place, and time. Vital signs are normal. He appears well-developed and well-nourished.  HENT:  Head: Normocephalic and atraumatic.  Neck: Normal range of motion.  Cardiovascular: Normal rate, regular rhythm, normal heart sounds and intact distal pulses.  Pulmonary/Chest: Effort  normal and breath sounds normal. No accessory muscle usage. No respiratory distress.  Abdominal: Soft. Bowel sounds are normal.  Musculoskeletal: Normal range of motion.  Neurological: He is alert and oriented to person, place, and time.  Skin: Skin is warm and dry.  Vitals reviewed.  Radiology: No results found.  Laboratory examination:   Labs 12/16/2017: Total cholesterol 109, triglycerides 110, HDL 52, LDL 56.  TSH normal.  A1c 7.7%.  Serum glucose 174, BUN 15, creatinine 0.85, which eGFR greater than 60 ML, CMP otherwise normal.  CBC normal.  CMP Latest Ref Rng & Units 06/26/2017 10/29/2015 06/15/2015  Glucose 65 - 99 mg/dL 203(H) 135(H) 195(H)  BUN 6 - 20 mg/dL _0 Creatinine 0.61 - 1.24 mg/dL 0.91 0.97 0.87  Sodium 135 - 145 mmol/L 137 138 137  Potassium 3.5 - 5.1 mmol/L 4.2 4.8 4.8  Chloride 101 - 111 mmol/L 104 105 105  CO2 22 - 32 mmol/L _1 Calcium 8.9 - 10.3 mg/dL 9.2 9.6 9.1  Total Protein 6.5 - 8.1 g/dL - 6.7  -  Total Bilirubin 0.3 - 1.2 mg/dL - 1.0 -  Alkaline Phos 38 - 126 U/L - 78 -  AST 15 - 41 U/L - 32 -  ALT 17 - 63 U/L - 32 -   CBC Latest Ref Rng & Units 06/26/2017 10/29/2015 06/15/2015  WBC 4.0 - 10.5 K/uL 8.5 9.0 6.2  Hemoglobin 13.0 - 17.0 g/dL 15.7 14.9 15.2  Hematocrit 39.0 - 52.0 % 44.6 43.5 44.4  Platelets 150 - 400 K/uL 236 235 205   Lipid Panel     Component Value Date/Time   CHOL 131 06/15/2015 0651   TRIG 76 06/15/2015 0651   HDL 55 06/15/2015 0651   CHOLHDL 2.4 06/15/2015 0651   VLDL 15 06/15/2015 0651   LDLCALC 61 06/15/2015 0651   HEMOGLOBIN A1C Lab Results  Component Value Date   HGBA1C 8.0 (H) 01/04/2011   MPG 183 (H) 01/04/2011   TSH No results for input(s): TSH in the last 8760 hours.  PRN Meds:. There are no discontinued medications. No outpatient medications have been marked as taking for the 01/13/19 encounter (Office Visit) with Miquel Dunn, NP.    Cardiac Studies:   Coronary angiogram 06/26/2017: Normal LV systolic function, no regional wall motion of a mallet he. Widely patent mid LAD stent and mid circumflex stent and balloon angioplasty site to the ramus intermediate. The mid to distal LAD has a focal 60-70% stenosis. Apical LAD has a ulcerated 95-99% stenosis.  Lexiscan Sestamibi stress test 09/04/2017:  1. Pharmacologic stress testing was performed with intravenous administration of .4 mg of Lexiscan over a 10-15 seconds infusion. Stress symptoms included dyspnea and abdominal pain. Normal blood pressure. Exercise capacity not assessed. Stress EKG is non diagnostic for ischemia as it is a pharmacologic stress.  2. The overall quality of the study is excellent. There is no evidence of abnormal lung activity. Stress and rest SPECT images demonstrate homogeneous tracer distribution throughout the myocardium. Gated SPECT imaging reveals normal myocardial thickening and wall motion. The left ventricular ejection fraction was normal (81%).   3.  Low risk study.  Echo- 08/27/2017 1. Left ventricle cavity is normal in size. Mild concentric hypertrophy of the left ventricle. Normal global wall motion. Visual EF is 55-60%. Calculated EF 55%. 2. Trileaflet aortic valve with no regurgitation noted. Mild aortic valve leaflet thickening. 3. Mild (Grade I) mitral regurgitation. 4. Mild tricuspid regurgitation. No evidence of  pulmonary hypertension. 5. Mildly dilated ascending aorta, measures 3.5 cm.  Assessment:     ICD-10-CM   1. Coronary artery disease of native artery of native heart with stable angina pectoris ([place])  I25.118 EKG 12-Lead  2. Palpitations  R00.2   3. Vasovagal near syncope  R55   4. Uncontrolled type 2 diabetes mellitus with hyperglycemia ([place])  E11.65     EKG 01/13/2019: Normal sinus rhythm at 84 bpm, normal axis, no evidence of ischemia.   Recommendations:   Patient here for acute visit for chest pain. Over the last 2 weeks, patient has had recurrence of angina. No acute changes noted to EKG or physical exam. Do not feel that stress testing would be appropriate at this time. Would recommend medical management and if no improvement in symptoms, will proceed with coronary angiogram. I will start him on Imdur 60 mg daily. He can continue to use nitroglycerin as needed. Blood pressure and lipids are well controlled.  He has had one episode of near syncope, no recurrence. Improved with hydration. Possibly related to dehydration. Will continue to monitor. He has had occasional episodes of palpitations that he describes as fluttering sensation. Suggestive of PAC/PVC. Do not feel that he needs monitor at this time. I have reassured him. If any worsening, will consider monitor at that time.  Diabetes is uncontrolled, will continue to follow up with PCP for management. I will see him back as previously scheduled, but encouraged him to contact me sooner if needed.   *I have discussed this case with Dr. Einar Gip and he personally  examined the patient and participated in formulating the plan.*   Miquel Dunn, MSN, APRN, FNP-C [facility]. [place] Office: [phone] Fax: [phone]

## 2019-01-17 ENCOUNTER — Encounter: Payer: Self-pay | Admitting: Cardiology

## 2019-01-18 DIAGNOSIS — E1065 Type 1 diabetes mellitus with hyperglycemia: Secondary | ICD-10-CM | POA: Diagnosis not present

## 2019-01-18 DIAGNOSIS — E785 Hyperlipidemia, unspecified: Secondary | ICD-10-CM | POA: Diagnosis not present

## 2019-01-18 DIAGNOSIS — Z794 Long term (current) use of insulin: Secondary | ICD-10-CM | POA: Diagnosis not present

## 2019-01-31 ENCOUNTER — Other Ambulatory Visit: Payer: Self-pay

## 2019-01-31 ENCOUNTER — Encounter: Payer: Self-pay | Admitting: Cardiology

## 2019-01-31 ENCOUNTER — Ambulatory Visit: Payer: BLUE CROSS/BLUE SHIELD | Admitting: Cardiology

## 2019-01-31 VITALS — BP 124/70 | HR 76 | Ht 69.0 in | Wt 201.7 lb

## 2019-01-31 DIAGNOSIS — R55 Syncope and collapse: Secondary | ICD-10-CM

## 2019-01-31 DIAGNOSIS — I25118 Atherosclerotic heart disease of native coronary artery with other forms of angina pectoris: Secondary | ICD-10-CM | POA: Diagnosis not present

## 2019-01-31 DIAGNOSIS — R002 Palpitations: Secondary | ICD-10-CM

## 2019-01-31 NOTE — Progress Notes (Signed)
Primary Physician:  Kelton Pillar, MD   Patient ID: Travis Fletcher., male    DOB: Dec 23, 1955, 63 y.o.   MRN: [id number]  Subjective:    Chief Complaint  Patient presents with  . Coronary Artery Disease    HPI: Travis Fletcher.  is a 63 y.o. male  with known coronary artery disease, has mid LAD stent placed on 06/15/2015 with 3 x 26 mm resolute stent, balloon angioplasty to ramus intermediate on 07/11/2014 and mid circumflex stent with a 4.0 x 15 mm integrity non-DES stent in 2012. He presented on 06/24/2016 with symptoms of unstable angina pectoris, coronary angiography on 06/26/2017 revealed an ulcerated 95% stenosis of the apical LAD and about a 50-60% stenosis of the distal LAD and previously performed angioplasty sites were widely patent. Medical therapy was recommended. He has a history of hyperlipidemia with statin intolerance now on Repatha, uncontrolled type I diabetes follows Dr Dagmar Hait.  I have seen him about 3 weeks ago for worsening dyspnea, frequent episodes of chest pain and lightheadedness and dizziness, felt that it was related to vasovagal episodes and also probably angina pectoris from microvascular dysfunction.  He now presents here for three-week visit, states that he has not had any recurrence of chest pain however still continues to have fatigue which is improved.  Dyspnea again is the main complaint with decreased exercise capacity but overall has started to feel better from previous.  He could not tolerate isosorbide mononitrate due to headaches and palpitations.  Past Medical History:  Diagnosis Date  . Anginal pain ([place])   . Arthritis    "hands" (07/11/2014)  . CAD (coronary artery disease), native coronary artery 07/24/2018  . Coronary artery disease   . High cholesterol   . Hypercholesteremia 07/24/2018  . Insulin dependent diabetes mellitus ([place])   . Kidney stones    "passed them all"  . Neuromuscular disorder ([facility])    neuropathy   . Prostate cancer ([place]) 2010  . Statin intolerance 07/24/2018  . Uncontrolled diabetes mellitus ([place]) 07/24/2018    Past Surgical History:  Procedure Laterality Date  . BALLOON ANGIOPLASTY, ARTERY  07/13/2012  . CARDIAC CATHETERIZATION N/A 06/15/2015   Procedure: Left Heart Cath and Coronary Angiography;  Surgeon: Adrian Prows, MD;  Location: [place];  Service: Cardiovascular;  Laterality: N/A;  . CARDIAC CATHETERIZATION N/A 06/15/2015   Procedure: Coronary Stent Intervention;  Surgeon: Adrian Prows, MD;  Location: [place] [facility];  Service: Cardiovascular;  Laterality: N/A;  . CATARACT EXTRACTION W/ INTRAOCULAR LENS  IMPLANT, BILATERAL Bilateral ~ 2000  . CORONARY ANGIOPLASTY  07/11/2014  . CORONARY ANGIOPLASTY WITH STENT PLACEMENT  2012   "1"  . EYE SURGERY    . INGUINAL HERNIA REPAIR Left 1987  . INSERTION PROSTATE RADIATION SEED  2010  . LEFT HEART CATH AND CORONARY ANGIOGRAPHY N/A 06/26/2017   Procedure: LEFT HEART CATH AND CORONARY ANGIOGRAPHY;  Surgeon: Adrian Prows, MD;  Location: [place] [facility];  Service: Cardiovascular;  Laterality: N/A;  . LEFT HEART CATHETERIZATION WITH CORONARY ANGIOGRAM N/A 07/13/2012   Procedure: LEFT HEART CATHETERIZATION WITH CORONARY ANGIOGRAM;  Surgeon: Laverda Page, MD;  Location: [facility] C[facility];  Service: Cardiovascular;  Laterality: N/A;  . LEFT HEART CATHETERIZATION WITH CORONARY ANGIOGRAM N/A 07/11/2014   Procedure: LEFT HEART CATHETERIZATION WITH CORONARY ANGIOGRAM;  Surgeon: Laverda Page, MD;  Location: [facility] CATH LAB;  Service: Cardiovascular;  Laterality: N/A;  . RETINAL DETACHMENT SURGERY Left 2010  . RETINAL  LASER PROCEDURE Right 2010  . THYROIDECTOMY, PARTIAL  ~ 1979  . TONSILLECTOMY AND ADENOIDECTOMY  1977    Social History   Socioeconomic History  . Marital status: Married    Spouse name: Not on file  . Number of children: 2  . Years of education: Not on file  . Highest education level: Not on file  Occupational History  .  Not on file  Social Needs  . Financial resource strain: Not on file  . Food insecurity    Worry: Not on file    Inability: Not on file  . Transportation needs    Medical: Not on file    Non-medical: Not on file  Tobacco Use  . Smoking status: Never Smoker  . Smokeless tobacco: Never Used  Substance and Sexual Activity  . Alcohol use: Yes    Comment: 2-3 beers per week  . Drug use: No  . Sexual activity: Not Currently  Lifestyle  . Physical activity    Days per week: Not on file    Minutes per session: Not on file  . Stress: Not on file  Relationships  . Social Herbalist on phone: Not on file    Gets together: Not on file    Attends religious service: Not on file    Active member of club or organization: Not on file    Attends meetings of clubs or organizations: Not on file    Relationship status: Not on file  . Intimate partner violence    Fear of current or ex partner: Not on file    Emotionally abused: Not on file    Physically abused: Not on file    Forced sexual activity: Not on file  Other Topics Concern  . Not on file  Social History Narrative  . Not on file    Review of Systems  Constitution: Positive for malaise/fatigue (improving). Negative for decreased appetite, weight gain and weight loss.  Eyes: Negative for visual disturbance.  Cardiovascular: Positive for chest pain (improved) and dyspnea on exertion (worsening). Negative for claudication, leg swelling, orthopnea, palpitations and syncope.  Respiratory: Negative for hemoptysis and wheezing.   Endocrine: Negative for cold intolerance and heat intolerance.  Hematologic/Lymphatic: Does not bruise/bleed easily.  Skin: Negative for nail changes.  Musculoskeletal: Negative for muscle weakness and myalgias.  Gastrointestinal: Negative for abdominal pain, change in bowel habit, nausea and vomiting.  Neurological: Negative for difficulty with concentration, dizziness, focal weakness and headaches.   Psychiatric/Behavioral: Negative for altered mental status and suicidal ideas.  All other systems reviewed and are negative.     Objective:  Blood pressure 124/70, pulse 76, height _0  (1.753 m), weight 201 lb 11.2 oz (91.5 kg), SpO2 96 %. Body mass index is 29.79 kg/m.    Physical Exam  Constitutional: He is oriented to person, place, and time. Vital signs are normal. He appears well-developed and well-nourished.  HENT:  Head: Normocephalic and atraumatic.  Neck: Normal range of motion.  Cardiovascular: Normal rate, regular rhythm, normal heart sounds and intact distal pulses.  Pulmonary/Chest: Effort normal and breath sounds normal. No accessory muscle usage. No respiratory distress.  Abdominal: Soft. Bowel sounds are normal.  Musculoskeletal: Normal range of motion.  Neurological: He is alert and oriented to person, place, and time.  Skin: Skin is warm and dry.  Vitals reviewed.  Radiology: No results found.  Laboratory examination:   Labs 12/16/2017: Total cholesterol 109, triglycerides 110, HDL 52, LDL 56.  TSH normal.  A1c 7.7%.  Serum glucose 174, BUN 15, creatinine 0.85, which eGFR greater than 60 ML, CMP otherwise normal.  CBC normal.  CMP Latest Ref Rng & Units 06/26/2017 10/29/2015 06/15/2015  Glucose 65 - 99 mg/dL 203(H) 135(H) 195(H)  BUN 6 - 20 mg/dL _0 Creatinine 0.61 - 1.24 mg/dL 0.91 0.97 0.87  Sodium 135 - 145 mmol/L 137 138 137  Potassium 3.5 - 5.1 mmol/L 4.2 4.8 4.8  Chloride 101 - 111 mmol/L 104 105 105  CO2 22 - 32 mmol/L _1 Calcium 8.9 - 10.3 mg/dL 9.2 9.6 9.1  Total Protein 6.5 - 8.1 g/dL - 6.7 -  Total Bilirubin 0.3 - 1.2 mg/dL - 1.0 -  Alkaline Phos 38 - 126 U/L - 78 -  AST 15 - 41 U/L - 32 -  ALT 17 - 63 U/L - 32 -   CBC Latest Ref Rng & Units 06/26/2017 10/29/2015 06/15/2015  WBC 4.0 - 10.5 K/uL 8.5 9.0 6.2  Hemoglobin 13.0 - 17.0 g/dL 15.7 14.9 15.2  Hematocrit 39.0 - 52.0 % 44.6 43.5 44.4  Platelets 150 - 400 K/uL 236 235 205    Lipid Panel     Component Value Date/Time   CHOL 131 06/15/2015 0651   TRIG 76 06/15/2015 0651   HDL 55 06/15/2015 0651   CHOLHDL 2.4 06/15/2015 0651   VLDL 15 06/15/2015 0651   LDLCALC 61 06/15/2015 0651   HEMOGLOBIN A1C Lab Results  Component Value Date   HGBA1C 8.0 (H) 01/04/2011   MPG 183 (H) 01/04/2011   TSH No results for input(s): TSH in the last 8760 hours.  PRN Meds:. Medications Discontinued During This Encounter  Medication Reason  . isosorbide mononitrate (IMDUR) 60 MG 24 hr tablet Error   Current Meds  Medication Sig  . aspirin 81 MG tablet Take 1 tablet (81 mg total) by mouth daily.  . clopidogrel (PLAVIX) 75 MG tablet Take 75 mg by mouth daily.  . insulin aspart (NOVOLOG) 100 UNIT/ML injection Inject into the skin See admin instructions. Uses via Insulin Pump  . losartan (COZAAR) 25 MG tablet TAKE 1/2 TABLET BY MOUTH DAILY  . metoprolol tartrate (LOPRESSOR) 25 MG tablet Take 12.5 mg by mouth 2 (two) times daily.  . Multiple Vitamins-Minerals (CENTRUM SILVER 50+MEN PO) Take 1 tablet by mouth daily.  . nitroGLYCERIN (NITROSTAT) 0.4 MG SL tablet Place 1 tablet (0.4 mg total) under the tongue every 5 (five) minutes as needed for chest pain.  . ranolazine (RANEXA) 500 MG 12 hr tablet Take 1 tablet by mouth 2 (two) times daily.  Marland Kitchen REPATHA SURECLICK 962 MG/ML SOAJ Inject 140 mg into the muscle every 14 (fourteen) days.   . vitamin B-12 (CYANOCOBALAMIN) 100 MCG tablet Take 100 mcg by mouth daily.    Cardiac Studies:   Lexiscan Sestamibi stress test 09/04/2017:  1. Pharmacologic stress testing was performed with intravenous administration of .4 mg of Lexiscan over a 10-15 seconds infusion. Stress symptoms included dyspnea and abdominal pain. Normal blood pressure. Exercise capacity not assessed. Stress EKG is non diagnostic for ischemia as it is a pharmacologic stress.  2. The overall quality of the study is excellent. There is no evidence of abnormal lung activity.  Stress and rest SPECT images demonstrate homogeneous tracer distribution throughout the myocardium. Gated SPECT imaging reveals normal myocardial thickening and wall motion. The left ventricular ejection fraction was normal (81%).   3. Low risk study.  Echo- 08/27/2017 1. Left  ventricle cavity is normal in size. Mild concentric hypertrophy of the left ventricle. Normal global wall motion. Visual EF is 55-60%. Calculated EF 55%. 2. Trileaflet aortic valve with no regurgitation noted. Mild aortic valve leaflet thickening. 3. Mild (Grade I) mitral regurgitation. 4. Mild tricuspid regurgitation. No evidence of pulmonary hypertension. 5. Mildly dilated ascending aorta, measures 3.5 cm.  Coronary angiogram 06/26/2017: Normal LV systolic function, no regional wall motion of a mallet he. Widely patent mid LAD stent and mid circumflex stent and balloon angioplasty site to the ramus intermediate. The mid to distal LAD has a focal 60-70% stenosis. Apical LAD has a ulcerated 95-99% stenosis.  Assessment:     ICD-10-CM   1. Coronary artery disease of native artery of native heart with stable angina pectoris ([place])  I25.118   2. Vasovagal near syncope  R55   3. Palpitations  R00.2    EKG 01/13/2019: Normal sinus rhythm at 84 bpm, normal axis, no evidence of ischemia.   Recommendations:   Patient with significant cardio vascular risk factors that includes hyperlipidemia, which is well controlled, uncontrolled diabetes mellitus, who presents with worsening dyspnea, fatigue, was seen 2 weeks ago for near syncope felt to be vasovagal.  Symptoms of angina has resolved but still has dyspnea and decreased exercise capacity and fatigue.  I do not think that he has progression of coronary artery disease, last cardiac catheterization in the beginning of 2019 had revealed patent stent and just a distal LAD disease.  I do not think he needs repeat cardiac catheterization.  Stress testing in the past including  routine treadmill  Always leads to marked ST changes hence not diagnostic.  I do not think he needs a nuclear stress test, would recommend watchful observation for now.  I'll long discussion with the patient regarding endothelial dysfunction, in view of all other risk factors being well controlled except for diabetes mellitus, A1c of 7.5% was given to the patient.  He states that he feels overall much better than 2 weeks ago, if she continues to have recurrent episodes of any dizziness, worsening fatigue and dyspnea, I would have a low threshold to perform cardiac catheterization.  This was a 50 minute face-to-face encounter.   Adrian Prows, MD, [facility] 01/31/2019, [facility]. [place] Pager: [phone] Office: [phone] If no answer Cell [phone]

## 2019-02-15 DIAGNOSIS — E109 Type 1 diabetes mellitus without complications: Secondary | ICD-10-CM | POA: Diagnosis not present

## 2019-02-15 DIAGNOSIS — Z794 Long term (current) use of insulin: Secondary | ICD-10-CM | POA: Diagnosis not present

## 2019-02-15 DIAGNOSIS — E108 Type 1 diabetes mellitus with unspecified complications: Secondary | ICD-10-CM | POA: Diagnosis not present

## 2019-02-15 DIAGNOSIS — Z9641 Presence of insulin pump (external) (internal): Secondary | ICD-10-CM | POA: Diagnosis not present

## 2019-02-21 ENCOUNTER — Other Ambulatory Visit: Payer: Self-pay | Admitting: Cardiology

## 2019-02-23 DIAGNOSIS — C61 Malignant neoplasm of prostate: Secondary | ICD-10-CM | POA: Diagnosis not present

## 2019-03-31 DIAGNOSIS — E108 Type 1 diabetes mellitus with unspecified complications: Secondary | ICD-10-CM | POA: Diagnosis not present

## 2019-03-31 DIAGNOSIS — Z794 Long term (current) use of insulin: Secondary | ICD-10-CM | POA: Diagnosis not present

## 2019-03-31 DIAGNOSIS — E109 Type 1 diabetes mellitus without complications: Secondary | ICD-10-CM | POA: Diagnosis not present

## 2019-03-31 DIAGNOSIS — Z9641 Presence of insulin pump (external) (internal): Secondary | ICD-10-CM | POA: Diagnosis not present

## 2019-04-25 ENCOUNTER — Other Ambulatory Visit: Payer: Self-pay

## 2019-04-25 DIAGNOSIS — Z20822 Contact with and (suspected) exposure to covid-19: Secondary | ICD-10-CM

## 2019-04-27 LAB — NOVEL CORONAVIRUS, NAA: SARS-CoV-2, NAA: NOT DETECTED

## 2019-05-09 ENCOUNTER — Other Ambulatory Visit: Payer: Self-pay | Admitting: Cardiology

## 2019-05-09 DIAGNOSIS — Z794 Long term (current) use of insulin: Secondary | ICD-10-CM | POA: Diagnosis not present

## 2019-05-09 DIAGNOSIS — Z9641 Presence of insulin pump (external) (internal): Secondary | ICD-10-CM | POA: Diagnosis not present

## 2019-05-09 DIAGNOSIS — E109 Type 1 diabetes mellitus without complications: Secondary | ICD-10-CM | POA: Diagnosis not present

## 2019-05-09 DIAGNOSIS — E108 Type 1 diabetes mellitus with unspecified complications: Secondary | ICD-10-CM | POA: Diagnosis not present

## 2019-06-01 ENCOUNTER — Other Ambulatory Visit: Payer: Self-pay | Admitting: Cardiology

## 2019-06-10 DIAGNOSIS — Z8616 Personal history of COVID-19: Secondary | ICD-10-CM

## 2019-06-10 HISTORY — DX: Personal history of COVID-19: Z86.16

## 2019-06-15 ENCOUNTER — Telehealth: Payer: Self-pay

## 2019-06-15 NOTE — Telephone Encounter (Signed)
Pt is having issued getting his medication. Ranexa and Clopidogrel. His insurance has changed and it is being delayed. He has not taken Clopidogrel in a few days and will run out of ranexa.  Is it ok for him to stop and restart once his insurance gets straightened out. We don't have ranexa samples at this time.

## 2019-06-16 NOTE — Telephone Encounter (Signed)
Yes

## 2019-06-21 ENCOUNTER — Other Ambulatory Visit: Payer: Self-pay | Admitting: Cardiology

## 2019-07-18 ENCOUNTER — Other Ambulatory Visit: Payer: Self-pay

## 2019-07-18 ENCOUNTER — Encounter: Payer: Self-pay | Admitting: Cardiology

## 2019-07-18 ENCOUNTER — Ambulatory Visit: Payer: BC Managed Care – PPO | Admitting: Cardiology

## 2019-07-18 ENCOUNTER — Ambulatory Visit: Payer: 59

## 2019-07-18 VITALS — BP 121/70 | HR 84 | Temp 97.7°F | Ht 69.0 in | Wt 204.0 lb

## 2019-07-18 DIAGNOSIS — R0600 Dyspnea, unspecified: Secondary | ICD-10-CM

## 2019-07-18 DIAGNOSIS — R0609 Other forms of dyspnea: Secondary | ICD-10-CM | POA: Diagnosis not present

## 2019-07-18 DIAGNOSIS — I25118 Atherosclerotic heart disease of native coronary artery with other forms of angina pectoris: Secondary | ICD-10-CM

## 2019-07-18 DIAGNOSIS — R002 Palpitations: Secondary | ICD-10-CM

## 2019-07-18 DIAGNOSIS — Z8616 Personal history of COVID-19: Secondary | ICD-10-CM | POA: Diagnosis not present

## 2019-07-18 MED ORDER — RANOLAZINE ER 500 MG PO TB12: 500.0000 mg | ORAL_TABLET | Freq: Two times a day (BID) | ORAL | 1 refills | Status: DC

## 2019-07-18 NOTE — Progress Notes (Signed)
Primary Physician:  Kelton Pillar, MD   Patient ID: Travis Fletcher., male    DOB: 1956-05-09, 64 y.o.   MRN: [id number]  Subjective:    Chief Complaint  Patient presents with  . Coronary Artery Disease  . Follow-up    6 month    HPI: Travis Fletcher.  is a 64 y.o. male  with known coronary artery disease, has mid LAD stent placed on 06/15/2015 with 3 x 26 mm resolute stent, balloon angioplasty to ramus intermediate on 07/11/2014 and mid circumflex stent with a 4.0 x 15 mm integrity non-DES stent in 2012. He presented on 06/24/2016 with symptoms of unstable angina pectoris, coronary angiography on 06/26/2017 revealed an ulcerated 95% stenosis of the apical LAD and about a 50-60% stenosis of the distal LAD and previously performed angioplasty sites were widely patent. Medical therapy was recommended. He has a history of hyperlipidemia with statin intolerance now on Repatha, uncontrolled type I diabetes follows Dr Dagmar Hait.  Recently had COVID in January, 1 week ago developed intermittent episodes of palpitations described as skipping beat. Also having episodes of nitroglycerin responsive chest pain and dyspnea on exertion. He has been off Ranexa since January. Unable to do any activities.   Diabetes has been uncontrolled recently.   Past Medical History:  Diagnosis Date  . Anginal pain ([place])   . Arthritis    "hands" (07/11/2014)  . CAD (coronary artery disease), native coronary artery 07/24/2018  . Coronary artery disease   . High cholesterol   . Hypercholesteremia 07/24/2018  . Insulin dependent diabetes mellitus   . Kidney stones    "passed them all"  . Neuromuscular disorder (HCC)    neuropathy  . Prostate cancer ([place]) 2010  . Statin intolerance 07/24/2018  . Uncontrolled diabetes mellitus ([place]) 07/24/2018    Past Surgical History:  Procedure Laterality Date  . BALLOON ANGIOPLASTY, ARTERY  07/13/2012  . CARDIAC CATHETERIZATION N/A 06/15/2015   Procedure: Left Heart Cath and Coronary Angiography;  Surgeon: Adrian Prows, MD;  Location: [facility];  Service: Cardiovascular;  Laterality: N/A;  . CARDIAC CATHETERIZATION N/A 06/15/2015   Procedure: Coronary Stent Intervention;  Surgeon: Adrian Prows, MD;  Location: [facility];  Service: Cardiovascular;  Laterality: N/A;  . CATARACT EXTRACTION W/ INTRAOCULAR LENS  IMPLANT, BILATERAL Bilateral ~ 2000  . CORONARY ANGIOPLASTY  07/11/2014  . CORONARY ANGIOPLASTY WITH STENT PLACEMENT  2012   "1"  . EYE SURGERY    . INGUINAL HERNIA REPAIR Left 1987  . INSERTION PROSTATE RADIATION SEED  2010  . LEFT HEART CATH AND CORONARY ANGIOGRAPHY N/A 06/26/2017   Procedure: LEFT HEART CATH AND CORONARY ANGIOGRAPHY;  Surgeon: Adrian Prows, MD;  Location: [facility];  Service: Cardiovascular;  Laterality: N/A;  . LEFT HEART CATHETERIZATION WITH CORONARY ANGIOGRAM N/A 07/13/2012   Procedure: LEFT HEART CATHETERIZATION WITH CORONARY ANGIOGRAM;  Surgeon: Laverda Page, MD;  Location: [facility] CATH LAB;  Service: Cardiovascular;  Laterality: N/A;  . LEFT HEART CATHETERIZATION WITH CORONARY ANGIOGRAM N/A 07/11/2014   Procedure: LEFT HEART CATHETERIZATION WITH CORONARY ANGIOGRAM;  Surgeon: Laverda Page, MD;  Location: [facility] CATH LAB;  Service: Cardiovascular;  Laterality: N/A;  . RETINAL DETACHMENT SURGERY Left 2010  . RETINAL LASER PROCEDURE Right 2010  . THYROIDECTOMY, PARTIAL  ~ 1979  . TONSILLECTOMY AND ADENOIDECTOMY  1977    Social History   Socioeconomic History  . Marital status: Married    Spouse name: Not on file  .  Number of children: 2  . Years of education: Not on file  . Highest education level: Not on file  Occupational History  . Not on file  Tobacco Use  . Smoking status: Never Smoker  . Smokeless tobacco: Never Used  Substance and Sexual Activity  . Alcohol use: Yes    Comment: 2-3 beers per week  . Drug use: No  . Sexual activity: Not Currently  Other Topics Concern  . Not  on file  Social History Narrative  . Not on file   Social Determinants of Health   Financial Resource Strain:   . Difficulty of Paying Living Expenses: Not on file  Food Insecurity:   . Worried About Charity fundraiser in the Last Year: Not on file  . Ran Out of Food in the Last Year: Not on file  Transportation Needs:   . Lack of Transportation (Medical): Not on file  . Lack of Transportation (Non-Medical): Not on file  Physical Activity:   . Days of Exercise per Week: Not on file  . Minutes of Exercise per Session: Not on file  Stress:   . Feeling of Stress : Not on file  Social Connections:   . Frequency of Communication with Friends and Family: Not on file  . Frequency of Social Gatherings with Friends and Family: Not on file  . Attends Religious Services: Not on file  . Active Member of Clubs or Organizations: Not on file  . Attends Archivist Meetings: Not on file  . Marital Status: Not on file  Intimate Partner Violence:   . Fear of Current or Ex-Partner: Not on file  . Emotionally Abused: Not on file  . Physically Abused: Not on file  . Sexually Abused: Not on file    Review of Systems  Constitution: Positive for malaise/fatigue (improving). Negative for decreased appetite, weight gain and weight loss.  Eyes: Negative for visual disturbance.  Cardiovascular: Positive for chest pain (improved) and dyspnea on exertion (worsening). Negative for claudication, leg swelling, orthopnea, palpitations and syncope.  Respiratory: Negative for hemoptysis and wheezing.   Endocrine: Negative for cold intolerance and heat intolerance.  Hematologic/Lymphatic: Does not bruise/bleed easily.  Skin: Negative for nail changes.  Musculoskeletal: Negative for muscle weakness and myalgias.  Gastrointestinal: Negative for abdominal pain, change in bowel habit, nausea and vomiting.  Neurological: Negative for difficulty with concentration, dizziness, focal weakness and headaches.    Psychiatric/Behavioral: Negative for altered mental status and suicidal ideas.  All other systems reviewed and are negative.     Objective:  Blood pressure 121/70, pulse 84, temperature 97.7 F (36.5 C), height 5' 9"  (1.753 m), weight 204 lb (92.5 kg), SpO2 97 %. Body mass index is 30.13 kg/m.    Physical Exam  Constitutional: He is oriented to person, place, and time. Vital signs are normal. He appears well-developed and well-nourished.  HENT:  Head: Normocephalic and atraumatic.  Cardiovascular: Normal rate, regular rhythm, normal heart sounds and intact distal pulses.  Pulmonary/Chest: Effort normal and breath sounds normal. No accessory muscle usage. No respiratory distress.  Abdominal: Soft. Bowel sounds are normal.  Musculoskeletal:        General: Normal range of motion.     Cervical back: Normal range of motion.  Neurological: He is alert and oriented to person, place, and time.  Skin: Skin is warm and dry.  Vitals reviewed.  Radiology: No results found.  Laboratory examination:   Labs 12/16/2017: Total cholesterol 109, triglycerides 110, HDL 52,  LDL 56.    TSH normal.  A1c 7.7%.  Serum glucose 174, BUN 15, creatinine 0.85, which eGFR greater than 60 ML, CMP otherwise normal.  CBC normal.  CMP Latest Ref Rng & Units 06/26/2017 10/29/2015 06/15/2015  Glucose 65 - 99 mg/dL 203(H) 135(H) 195(H)  BUN 6 - 20 mg/dL 12 14 18   Creatinine 0.61 - 1.24 mg/dL 0.91 0.97 0.87  Sodium 135 - 145 mmol/L 137 138 137  Potassium 3.5 - 5.1 mmol/L 4.2 4.8 4.8  Chloride 101 - 111 mmol/L 104 105 105  CO2 22 - 32 mmol/L 23 26 24   Calcium 8.9 - 10.3 mg/dL 9.2 9.6 9.1  Total Protein 6.5 - 8.1 g/dL - 6.7 -  Total Bilirubin 0.3 - 1.2 mg/dL - 1.0 -  Alkaline Phos 38 - 126 U/L - 78 -  AST 15 - 41 U/L - 32 -  ALT 17 - 63 U/L - 32 -   CBC Latest Ref Rng & Units 06/26/2017 10/29/2015 06/15/2015  WBC 4.0 - 10.5 K/uL 8.5 9.0 6.2  Hemoglobin 13.0 - 17.0 g/dL 15.7 14.9 15.2  Hematocrit 39.0 - 52.0 %  44.6 43.5 44.4  Platelets 150 - 400 K/uL 236 235 205   Lipid Panel     Component Value Date/Time   CHOL 131 06/15/2015 0651   TRIG 76 06/15/2015 0651   HDL 55 06/15/2015 0651   CHOLHDL 2.4 06/15/2015 0651   VLDL 15 06/15/2015 0651   LDLCALC 61 06/15/2015 0651   HEMOGLOBIN A1C Lab Results  Component Value Date   HGBA1C 8.0 (H) 01/04/2011   MPG 183 (H) 01/04/2011   TSH No results for input(s): TSH in the last 8760 hours.  PRN Meds:. Medications Discontinued During This Encounter  Medication Reason  . ranolazine (RANEXA) 500 MG 12 hr tablet Error  . vitamin B-12 (CYANOCOBALAMIN) 100 MCG tablet Error   Current Meds  Medication Sig  . albuterol (VENTOLIN HFA) 108 (90 Base) MCG/ACT inhaler as needed.  Marland Kitchen aspirin 81 MG tablet Take 1 tablet (81 mg total) by mouth daily.  . clopidogrel (PLAVIX) 75 MG tablet TAKE 1 TABLET BY MOUTH ONCE DAILY  . insulin aspart (NOVOLOG) 100 UNIT/ML injection Inject into the skin See admin instructions. Uses via Insulin Pump  . losartan (COZAAR) 25 MG tablet TAKE 1/2 TABLET BY MOUTH DAILY  . metoprolol tartrate (LOPRESSOR) 25 MG tablet TAKE 1/2 TABLET BY MOUTH TWICE A DAY AS NEEDED  . Multiple Vitamins-Minerals (CENTRUM SILVER 50+MEN PO) Take 1 tablet by mouth daily.  . nitroGLYCERIN (NITROSTAT) 0.4 MG SL tablet Place 1 tablet (0.4 mg total) under the tongue every 5 (five) minutes as needed for chest pain.  Marland Kitchen REPATHA SURECLICK 623 MG/ML SOAJ INJECT ONE ML EVERY TWO WEEKS.    Cardiac Studies:   Lexiscan Sestamibi stress test 09/04/2017:  1. Pharmacologic stress testing was performed with intravenous administration of .4 mg of Lexiscan over a 10-15 seconds infusion. Stress symptoms included dyspnea and abdominal pain. Normal blood pressure. Exercise capacity not assessed. Stress EKG is non diagnostic for ischemia as it is a pharmacologic stress.  2. The overall quality of the study is excellent. There is no evidence of abnormal lung activity. Stress and  rest SPECT images demonstrate homogeneous tracer distribution throughout the myocardium. Gated SPECT imaging reveals normal myocardial thickening and wall motion. The left ventricular ejection fraction was normal (81%).   3. Low risk study.  Echo- 08/27/2017 1. Left ventricle cavity is normal in size. Mild concentric hypertrophy of  the left ventricle. Normal global wall motion. Visual EF is 55-60%. Calculated EF 55%. 2. Trileaflet aortic valve with no regurgitation noted. Mild aortic valve leaflet thickening. 3. Mild (Grade I) mitral regurgitation. 4. Mild tricuspid regurgitation. No evidence of pulmonary hypertension. 5. Mildly dilated ascending aorta, measures 3.5 cm.  Coronary angiogram 06/26/2017: Normal LV systolic function, no regional wall motion of a mallet he. Widely patent mid LAD stent and mid circumflex stent and balloon angioplasty site to the ramus intermediate. The mid to distal LAD has a focal 60-70% stenosis. Apical LAD has a ulcerated 95-99% stenosis.  Assessment:     ICD-10-CM   1. Coronary artery disease of native artery of native heart with stable angina pectoris ([place])  I25.118 EKG 12-Lead    PCV ECHOCARDIOGRAM COMPLETE  2. Palpitations  R00.2 Holter monitor - 48 hour  3. Dyspnea on exertion  R06.00   4. History of COVID-19  Z86.16    EKG 07/18/2019: Normal sinus rhythm at 76 bpm, normal axis, no evidence of ischemia.   Recommendations:   Jayant Kriz.  is a 64 y.o. male  with known coronary artery disease, has mid LAD stent placed on 06/15/2015 with 3 x 26 mm resolute stent, balloon angioplasty to ramus intermediate on 07/11/2014 and mid circumflex stent with a 4.0 x 15 mm integrity non-DES stent in 2012. He presented on 06/24/2016 with symptoms of unstable angina pectoris, coronary angiography on 06/26/2017 revealed an ulcerated 95% stenosis of the apical LAD and about a 50-60% stenosis of the distal LAD and previously performed angioplasty sites were  widely patent. Medical therapy was recommended. He has a history of hyperlipidemia with statin intolerance now on Repatha, uncontrolled type I diabetes follows Dr Dagmar Hait.  Patient over the last 1 week or so has had symptoms of angina that is nitroglycerin responsive. He has been out of his Ranexa over the last 1 month due to insurance denial. He has previously had significant improvement in his symptoms with Ranexa. Could not tolerate Imdur due to side effects. I have resent a prescription for this and will try to obtain approval from insurance. No acute EKG changes. If he continues to have symptoms, will try changing his Metoprolol to Bystolic or Carvedilol.   His symptoms could also be related to post-viral syndrome. Will obtain echocardiogram to exclude any structural changes and for evaluation of myocarditis. His symptoms of palpitations are suggestive PVC's, but will place him on 48 hour holter monitor for further evaluation. I will plan to see him back in 2 weeks for close follow up.   Miquel Dunn, MSN, APRN, FNP-C [facility]. [place] Office: [phone] Fax: [phone]

## 2019-07-20 ENCOUNTER — Other Ambulatory Visit: Payer: Self-pay

## 2019-07-20 ENCOUNTER — Ambulatory Visit: Payer: 59

## 2019-07-20 DIAGNOSIS — I25118 Atherosclerotic heart disease of native coronary artery with other forms of angina pectoris: Secondary | ICD-10-CM

## 2019-07-21 ENCOUNTER — Other Ambulatory Visit: Payer: Self-pay

## 2019-07-21 DIAGNOSIS — I25118 Atherosclerotic heart disease of native coronary artery with other forms of angina pectoris: Secondary | ICD-10-CM

## 2019-07-21 MED ORDER — AMLODIPINE BESYLATE 5 MG PO TABS: 5.0000 mg | ORAL_TABLET | Freq: Every day | ORAL | 0 refills | Status: DC

## 2019-07-21 MED ORDER — AMLODIPINE BESYLATE 5 MG PO TABS: 5.0000 mg | ORAL_TABLET | Freq: Every day | ORAL | 3 refills | Status: DC

## 2019-07-26 ENCOUNTER — Ambulatory Visit (INDEPENDENT_AMBULATORY_CARE_PROVIDER_SITE_OTHER): Payer: 59 | Admitting: Cardiology

## 2019-07-26 ENCOUNTER — Other Ambulatory Visit: Payer: Self-pay | Admitting: Cardiology

## 2019-07-26 ENCOUNTER — Encounter: Payer: Self-pay | Admitting: Cardiology

## 2019-07-26 ENCOUNTER — Other Ambulatory Visit: Payer: Self-pay

## 2019-07-26 VITALS — BP 140/72 | HR 77 | Temp 97.6°F | Ht 69.0 in | Wt 201.0 lb

## 2019-07-26 DIAGNOSIS — R0609 Other forms of dyspnea: Secondary | ICD-10-CM | POA: Diagnosis not present

## 2019-07-26 DIAGNOSIS — R0600 Dyspnea, unspecified: Secondary | ICD-10-CM

## 2019-07-26 DIAGNOSIS — R002 Palpitations: Secondary | ICD-10-CM | POA: Diagnosis not present

## 2019-07-26 DIAGNOSIS — I25118 Atherosclerotic heart disease of native coronary artery with other forms of angina pectoris: Secondary | ICD-10-CM | POA: Diagnosis not present

## 2019-07-26 MED ORDER — NITROGLYCERIN 0.4 MG SL SUBL: 0.4000 mg | SUBLINGUAL_TABLET | SUBLINGUAL | 6 refills | Status: DC | PRN

## 2019-07-26 NOTE — Progress Notes (Signed)
Primary Physician:  Kelton Pillar, MD   Patient ID: Travis Fletcher., male    DOB: March 16, 1956, 64 y.o.   MRN: [id number]  Subjective:   Chief Complaint  Patient presents with  . Coronary Artery Disease  . Follow-up    2 week f/u  . Palpitations  . Shortness of Breath    HPI: Travis Fletcher.  is a 64 y.o. male  with known coronary artery disease, has mid LAD stent placed on 06/15/2015 with 3 x 26 mm resolute stent, balloon angioplasty to ramus intermediate on 07/11/2014 and mid circumflex stent with a 4.0 x 15 mm integrity non-DES stent in 2012. He presented on 06/24/2016 with symptoms of unstable angina pectoris, coronary angiography on 06/26/2017 revealed an ulcerated 95% stenosis of the apical LAD and about a 50-60% stenosis of the distal LAD and previously performed angioplasty sites were widely patent. Medical therapy was recommended. He has a history of hyperlipidemia with statin intolerance now on Repatha, uncontrolled type I diabetes follows Dr Dagmar Hait.  Recently had COVID in January, 1 week ago developed intermittent episodes of palpitations described as skipping beat. Also having episodes of nitroglycerin responsive chest pain and dyspnea on exertion. He has been off Ranexa since January. Unable to do any activities. He just refilled his Rx today and also was having issues with Repatha since insurance changed. Continues to have marked fatigue, dyspnea, palpitations and angina pectoris intermittently. Diabetes has been uncontrolled recently.   Past Medical History:  Diagnosis Date  . Anginal pain ([place])   . Arthritis    "hands" (07/11/2014)  . CAD (coronary artery disease), native coronary artery 07/24/2018  . Coronary artery disease   . High cholesterol   . Hypercholesteremia 07/24/2018  . Insulin dependent diabetes mellitus   . Kidney stones    "passed them all"  . Neuromuscular disorder (HCC)    neuropathy  . Prostate cancer ([place]) 2010  . Statin  intolerance 07/24/2018  . Uncontrolled diabetes mellitus ([place]) 07/24/2018   Past Surgical History:  Procedure Laterality Date  . BALLOON ANGIOPLASTY, ARTERY  07/13/2012  . CARDIAC CATHETERIZATION N/A 06/15/2015   Procedure: Left Heart Cath and Coronary Angiography;  Surgeon: Adrian Prows, MD;  Location: [place] [facility];  Service: Cardiovascular;  Laterality: N/A;  . CARDIAC CATHETERIZATION N/A 06/15/2015   Procedure: Coronary Stent Intervention;  Surgeon: Adrian Prows, MD;  Location: [place] [facility];  Service: Cardiovascular;  Laterality: N/A;  . CATARACT EXTRACTION W/ INTRAOCULAR LENS  IMPLANT, BILATERAL Bilateral ~ 2000  . CORONARY ANGIOPLASTY  07/11/2014  . CORONARY ANGIOPLASTY WITH STENT PLACEMENT  2012   "1"  . EYE SURGERY    . INGUINAL HERNIA REPAIR Left 1987  . INSERTION PROSTATE RADIATION SEED  2010  . LEFT HEART CATH AND CORONARY ANGIOGRAPHY N/A 06/26/2017   Procedure: LEFT HEART CATH AND CORONARY ANGIOGRAPHY;  Surgeon: Adrian Prows, MD;  Location: [place] [facility];  Service: Cardiovascular;  Laterality: N/A;  . LEFT HEART CATHETERIZATION WITH CORONARY ANGIOGRAM N/A 07/13/2012   Procedure: LEFT HEART CATHETERIZATION WITH CORONARY ANGIOGRAM;  Surgeon: Laverda Page, MD;  Location: [facility] CATH LAB;  Service: Cardiovascular;  Laterality: N/A;  . LEFT HEART CATHETERIZATION WITH CORONARY ANGIOGRAM N/A 07/11/2014   Procedure: LEFT HEART CATHETERIZATION WITH CORONARY ANGIOGRAM;  Surgeon: Laverda Page, MD;  Location: [facility] CATH LAB;  Service: Cardiovascular;  Laterality: N/A;  . RETINAL DETACHMENT SURGERY Left 2010  . RETINAL LASER PROCEDURE Right 2010  . THYROIDECTOMY, PARTIAL  ~  [place]   Social History   Tobacco Use  . Smoking status: Never Smoker  . Smokeless tobacco: Never Used  Substance Use Topics  . Alcohol use: Yes    Comment: 2-3 beers per week    Family History  Problem Relation Age of Onset  . Stroke Mother   . Atrial fibrillation Mother    . Atrial fibrillation Father   . Heart disease Sister    Review of Systems  Constitution: Positive for malaise/fatigue.  Cardiovascular: Positive for chest pain, dyspnea on exertion and palpitations. Negative for leg swelling.  Gastrointestinal: Negative for melena.   Objective:  Blood pressure 140/72, pulse 77, temperature 97.6 F (36.4 C), height 5' 9"  (1.753 m), weight 201 lb (91.2 kg), SpO2 98 %. Body mass index is 29.68 kg/m.  Vitals with BMI 07/26/2019 07/18/2019 01/31/2019  Height 5' 9"  5' 9"  5' 9"   Weight 201 lbs 204 lbs 201 lbs 11 oz  BMI 29.67 47.65 46.50  Systolic 354 656 812  Diastolic 72 70 70  Pulse 77 84 76     Physical Exam  Cardiovascular: Normal rate, regular rhythm, normal heart sounds, intact distal pulses and normal pulses. Exam reveals no gallop.  No murmur heard. No leg edema, no JVD.  Pulmonary/Chest: Effort normal and breath sounds normal.  Abdominal: Soft. Bowel sounds are normal.  Skin: Skin is warm and dry.   Radiology: CXR PA/LAT 01/25/2018: Cardiac shadows within normal limits. The lungs are well aerated bilaterally. No focal infiltrate or sizable effusion is seen. No acute bony abnormality is noted. Small pulmonary nodule is noted in the right mid lung laterally similar to that seen on prior CT examination.  IMPRESSION: No active cardiopulmonary disease.  Laboratory examination:   Labs 12/16/2017: Total cholesterol 109, triglycerides 110, HDL 52, LDL 56.    TSH normal.  A1c 7.7%.  Serum glucose 174, BUN 15, creatinine 0.85, which eGFR greater than 60 ML, CMP otherwise normal.  CBC normal.  CMP Latest Ref Rng & Units 06/26/2017 10/29/2015 06/15/2015  Glucose 65 - 99 mg/dL 203(H) 135(H) 195(H)  BUN 6 - 20 mg/dL 12 14 18   Creatinine 0.61 - 1.24 mg/dL 0.91 0.97 0.87  Sodium 135 - 145 mmol/L 137 138 137  Potassium 3.5 - 5.1 mmol/L 4.2 4.8 4.8  Chloride 101 - 111 mmol/L 104 105 105  CO2 22 - 32 mmol/L 23 26 24   Calcium 8.9 - 10.3 mg/dL 9.2  9.6 9.1  Total Protein 6.5 - 8.1 g/dL - 6.7 -  Total Bilirubin 0.3 - 1.2 mg/dL - 1.0 -  Alkaline Phos 38 - 126 U/L - 78 -  AST 15 - 41 U/L - 32 -  ALT 17 - 63 U/L - 32 -   CBC Latest Ref Rng & Units 06/26/2017 10/29/2015 06/15/2015  WBC 4.0 - 10.5 K/uL 8.5 9.0 6.2  Hemoglobin 13.0 - 17.0 g/dL 15.7 14.9 15.2  Hematocrit 39.0 - 52.0 % 44.6 43.5 44.4  Platelets 150 - 400 K/uL 236 235 205   Lipid Panel     Component Value Date/Time   CHOL 131 06/15/2015 0651   TRIG 76 06/15/2015 0651   HDL 55 06/15/2015 0651   CHOLHDL 2.4 06/15/2015 0651   VLDL 15 06/15/2015 0651   LDLCALC 61 06/15/2015 0651   HEMOGLOBIN A1C Lab Results  Component Value Date   HGBA1C 8.0 (H) 01/04/2011   MPG 183 (H) 01/04/2011   TSH No results for input(s): TSH in  the last 8760 hours.  PRN Meds:. Medications Discontinued During This Encounter  Medication Reason  . amLODipine (NORVASC) 5 MG tablet Error   Current Meds  Medication Sig  . albuterol (VENTOLIN HFA) 108 (90 Base) MCG/ACT inhaler as needed.  Marland Kitchen aspirin 81 MG tablet Take 1 tablet (81 mg total) by mouth daily.  . clopidogrel (PLAVIX) 75 MG tablet TAKE 1 TABLET BY MOUTH ONCE DAILY  . insulin aspart (NOVOLOG) 100 UNIT/ML injection Inject into the skin See admin instructions. Uses via Insulin Pump  . losartan (COZAAR) 25 MG tablet TAKE 1/2 TABLET BY MOUTH DAILY  . metoprolol tartrate (LOPRESSOR) 25 MG tablet TAKE 1/2 TABLET BY MOUTH TWICE A DAY AS NEEDED  . Multiple Vitamins-Minerals (CENTRUM SILVER 50+MEN PO) Take 1 tablet by mouth daily.  . nitroGLYCERIN (NITROSTAT) 0.4 MG SL tablet Place 1 tablet (0.4 mg total) under the tongue every 5 (five) minutes as needed for chest pain.  . ranolazine (RANEXA) 500 MG 12 hr tablet Take 1 tablet (500 mg total) by mouth 2 (two) times daily.  Marland Kitchen REPATHA SURECLICK 811 MG/ML SOAJ INJECT ONE ML EVERY TWO WEEKS.   Cardiac Studies:   Lexiscan Sestamibi stress test 09/04/2017:  1. Pharmacologic stress testing was  performed with intravenous administration of .4 mg of Lexiscan over a 10-15 seconds infusion. Stress symptoms included dyspnea and abdominal pain. Normal blood pressure. Exercise capacity not assessed. Stress EKG is non diagnostic for ischemia as it is a pharmacologic stress.  2. The overall quality of the study is excellent. There is no evidence of abnormal lung activity. Stress and rest SPECT images demonstrate homogeneous tracer distribution throughout the myocardium. Gated SPECT imaging reveals normal myocardial thickening and wall motion. The left ventricular ejection fraction was normal (81%).   3. Low risk study.  Echo- 08/27/2017 1. Left ventricle cavity is normal in size. Mild concentric hypertrophy of the left ventricle. Normal global wall motion. Visual EF is 55-60%. Calculated EF 55%. 2. Trileaflet aortic valve with no regurgitation noted. Mild aortic valve leaflet thickening. 3. Mild (Grade I) mitral regurgitation. 4. Mild tricuspid regurgitation. No evidence of pulmonary hypertension. 5. Mildly dilated ascending aorta, measures 3.5 cm.  Coronary angiogram 06/26/2017: Normal LV systolic function, no regional wall motion of a mallet he. Widely patent mid LAD stent and mid circumflex stent and balloon angioplasty site to the ramus intermediate. The mid to distal LAD has a focal 60-70% stenosis. Apical LAD has a ulcerated 95-99% stenosis.  Assessment:     ICD-10-CM   1. Palpitations  R00.2   2. Coronary artery disease of native artery of native heart with stable angina pectoris ([place])  I25.118 Cardiopulmonary exercise test  3. Dyspnea on exertion  R06.00 Cardiopulmonary exercise test   EKG 07/18/2019: Normal sinus rhythm at 76 bpm, normal axis, no evidence of ischemia.   Recommendations:   Kayn Haymore.  is a 64 y.o. male  with known coronary artery disease, has mid LAD stent placed on 06/15/2015 with 3 x 26 mm resolute stent, balloon angioplasty to ramus intermediate on  07/11/2014 and mid circumflex stent with a 4.0 x 15 mm integrity non-DES stent in 2012. He presented on 06/24/2016 with symptoms of unstable angina pectoris, coronary angiography on 06/26/2017 revealed an ulcerated 95% stenosis of the apical LAD and about a 50-60% stenosis of the distal LAD and previously performed angioplasty sites were widely patent. Medical therapy was recommended. He has a history of hyperlipidemia with statin intolerance now on Repatha, uncontrolled type  I diabetes follows Dr Dagmar Hait.  He continues to complain of exertional dyspnea, palpitations, event monitor report are still not uploaded.  However he is now back on Ranexa which should help with both palpitations and angina pectoris.  I will schedule him for cardiopulmonary stress test to evaluate his symptoms.  I do not think he needs cardiac catheterization.  He also states that he is applying for disability due to the fact that he has not been able to work.  He continues to have angina pectoris, dyspnea on the suspect it is probably related to endothelial dysfunction.  Recently he has been off of Repatha due to insurance reasons as well and this could be contributing.  He has now have had prior authorization for the same, will continue therapy.  I would like to see him back in 4 weeks for follow-up.  Adrian Prows, MSN, APRN, FNP-C [facility]. [place] Office: [phone] Fax: [phone]

## 2019-07-29 ENCOUNTER — Telehealth: Payer: Self-pay

## 2019-07-29 ENCOUNTER — Other Ambulatory Visit: Payer: Self-pay | Admitting: Cardiology

## 2019-07-29 DIAGNOSIS — I209 Angina pectoris, unspecified: Secondary | ICD-10-CM

## 2019-07-29 DIAGNOSIS — R0600 Dyspnea, unspecified: Secondary | ICD-10-CM

## 2019-07-29 DIAGNOSIS — R0609 Other forms of dyspnea: Secondary | ICD-10-CM

## 2019-08-01 ENCOUNTER — Other Ambulatory Visit (HOSPITAL_COMMUNITY): Payer: Self-pay | Admitting: *Deleted

## 2019-08-01 ENCOUNTER — Ambulatory Visit: Payer: 59 | Admitting: Cardiology

## 2019-08-01 ENCOUNTER — Other Ambulatory Visit: Payer: Self-pay

## 2019-08-01 ENCOUNTER — Ambulatory Visit (HOSPITAL_COMMUNITY): Payer: 59 | Attending: Cardiology

## 2019-08-01 DIAGNOSIS — R0789 Other chest pain: Secondary | ICD-10-CM | POA: Diagnosis not present

## 2019-08-01 DIAGNOSIS — R0609 Other forms of dyspnea: Secondary | ICD-10-CM

## 2019-08-01 DIAGNOSIS — R0600 Dyspnea, unspecified: Secondary | ICD-10-CM

## 2019-08-01 DIAGNOSIS — Z7901 Long term (current) use of anticoagulants: Secondary | ICD-10-CM | POA: Diagnosis not present

## 2019-08-01 DIAGNOSIS — E118 Type 2 diabetes mellitus with unspecified complications: Secondary | ICD-10-CM | POA: Insufficient documentation

## 2019-08-01 DIAGNOSIS — Z7982 Long term (current) use of aspirin: Secondary | ICD-10-CM | POA: Insufficient documentation

## 2019-08-01 DIAGNOSIS — I209 Angina pectoris, unspecified: Secondary | ICD-10-CM | POA: Diagnosis not present

## 2019-08-01 DIAGNOSIS — Z794 Long term (current) use of insulin: Secondary | ICD-10-CM | POA: Diagnosis not present

## 2019-08-01 DIAGNOSIS — G709 Myoneural disorder, unspecified: Secondary | ICD-10-CM | POA: Insufficient documentation

## 2019-08-01 DIAGNOSIS — E7800 Pure hypercholesterolemia, unspecified: Secondary | ICD-10-CM | POA: Insufficient documentation

## 2019-08-01 DIAGNOSIS — Z79899 Other long term (current) drug therapy: Secondary | ICD-10-CM | POA: Insufficient documentation

## 2019-08-01 DIAGNOSIS — Z7902 Long term (current) use of antithrombotics/antiplatelets: Secondary | ICD-10-CM | POA: Diagnosis not present

## 2019-08-01 DIAGNOSIS — M1990 Unspecified osteoarthritis, unspecified site: Secondary | ICD-10-CM | POA: Insufficient documentation

## 2019-08-01 NOTE — Progress Notes (Signed)
Low normal exercise capacity. NO ST-T changes of ischemia dn gas exchange was normal. No obvious etiology found for cardiopulmonary limitations. Will discuss on OV. JG

## 2019-08-02 ENCOUNTER — Other Ambulatory Visit: Payer: Self-pay | Admitting: Cardiology

## 2019-08-05 ENCOUNTER — Ambulatory Visit: Payer: BC Managed Care – PPO | Admitting: Cardiology

## 2019-08-08 ENCOUNTER — Telehealth: Payer: Self-pay

## 2019-08-08 MED ORDER — AMLODIPINE BESYLATE 5 MG PO TABS: 5.0000 mg | ORAL_TABLET | Freq: Every day | ORAL | 1 refills | Status: DC

## 2019-08-08 NOTE — Telephone Encounter (Signed)
Pt called asking if he can get his echo, and stress test results. Please advise

## 2019-08-08 NOTE — Telephone Encounter (Signed)
I have discussed echo, cardio-pulmonary stress test, and holter monitor results with the patient. Holter monitor showed NSR t osinus tachycardia with 1.3% PVC burden. Palpitations have stopped since being back on Ranexa; however, he continues to have days of fatigue and chest pain. States that he the longest he has been able to work is for 3 hours at a time. I will send in amlodipine 5 mg for anti-anginal therapy to see how he feels with this. Will try to move up his appt to later this week for follow up.

## 2019-08-11 ENCOUNTER — Encounter: Payer: Self-pay | Admitting: Cardiology

## 2019-08-11 ENCOUNTER — Ambulatory Visit: Payer: 59 | Admitting: Cardiology

## 2019-08-11 ENCOUNTER — Other Ambulatory Visit: Payer: Self-pay

## 2019-08-11 VITALS — BP 108/67 | HR 80 | Temp 97.9°F | Resp 16 | Ht 69.0 in | Wt 205.8 lb

## 2019-08-11 DIAGNOSIS — E7800 Pure hypercholesterolemia, unspecified: Secondary | ICD-10-CM

## 2019-08-11 DIAGNOSIS — R002 Palpitations: Secondary | ICD-10-CM

## 2019-08-11 DIAGNOSIS — E1059 Type 1 diabetes mellitus with other circulatory complications: Secondary | ICD-10-CM

## 2019-08-11 DIAGNOSIS — R0609 Other forms of dyspnea: Secondary | ICD-10-CM

## 2019-08-11 DIAGNOSIS — R0600 Dyspnea, unspecified: Secondary | ICD-10-CM

## 2019-08-11 DIAGNOSIS — I209 Angina pectoris, unspecified: Secondary | ICD-10-CM

## 2019-08-11 DIAGNOSIS — I25118 Atherosclerotic heart disease of native coronary artery with other forms of angina pectoris: Secondary | ICD-10-CM

## 2019-08-11 DIAGNOSIS — E1165 Type 2 diabetes mellitus with hyperglycemia: Secondary | ICD-10-CM

## 2019-08-11 MED ORDER — PRALUENT 150 MG/ML ~~LOC~~ SOAJ: 1.0000 "pen " | SUBCUTANEOUS | 4 refills | Status: DC

## 2019-08-11 MED ORDER — AMLODIPINE BESYLATE 5 MG PO TABS: 5.0000 mg | ORAL_TABLET | Freq: Every day | ORAL | 3 refills | Status: DC

## 2019-08-11 NOTE — Progress Notes (Signed)
 Primary Physician:  Griffin, Elaine, MD   Patient ID: Travis Harold Pop Jr., male    DOB: 11/14/1955, 63 y.o.   MRN: [id number]  Subjective:   No chief complaint on file.   HPI: Travis Harold Mccarter Jr.  is a 63 y.o. male male  with known coronary artery disease, has mid LAD stent placed on 06/15/2015 with 3 x 26 mm resolute stent, balloon angioplasty to ramus intermediate on 07/11/2014 and mid circumflex stent with a 4.0 x 15 mm integrity non-DES stent in 2012. He presented on 06/24/2016 with symptoms of unstable angina pectoris, coronary angiography on 06/26/2017 revealed an ulcerated 95% stenosis of the apical LAD and about a 50-60% stenosis of the distal LAD and previously performed angioplasty sites were widely patent. Medical therapy was recommended. He has a history of hyperlipidemia with statin intolerance now on Repatha, uncontrolled type I diabetes follows Dr Jeff Kerr.  Recently had COVID in January, since then having more angina episodes, dyspnea, fatigue. He is also was having issues with Repatha since insurance changed. DM continues to be uncontrolled. Symptoms of angina has improved since being on Amlodipine.   Past Medical History:  Diagnosis Date  . Anginal pain (HCC)   . Arthritis    "hands" (07/11/2014)  . CAD (coronary artery disease), native coronary artery 07/24/2018  . Coronary artery disease   . High cholesterol   . Hypercholesteremia 07/24/2018  . Insulin dependent diabetes mellitus   . Kidney stones    "passed them all"  . Neuromuscular disorder (HCC)    neuropathy  . Prostate cancer (HCC) 2010  . Statin intolerance 07/24/2018  . Uncontrolled diabetes mellitus (HCC) 07/24/2018   Past Surgical History:  Procedure Laterality Date  . BALLOON ANGIOPLASTY, ARTERY  07/13/2012  . CARDIAC CATHETERIZATION N/A 06/15/2015   Procedure: Left Heart Cath and Coronary Angiography;  Surgeon: Yousaf Sainato, MD;  Location: [facility];  Service: Cardiovascular;   Laterality: N/A;  . CARDIAC CATHETERIZATION N/A 06/15/2015   Procedure: Coronary Stent Intervention;  Surgeon: Dorothee Napierkowski, MD;  Location: [facility];  Service: Cardiovascular;  Laterality: N/A;  . CATARACT EXTRACTION W/ INTRAOCULAR LENS  IMPLANT, BILATERAL Bilateral ~ 2000  . CORONARY ANGIOPLASTY  07/11/2014  . CORONARY ANGIOPLASTY WITH STENT PLACEMENT  2012   "1"  . EYE SURGERY    . INGUINAL HERNIA REPAIR Left 1987  . INSERTION PROSTATE RADIATION SEED  2010  . LEFT HEART CATH AND CORONARY ANGIOGRAPHY N/A 06/26/2017   Procedure: LEFT HEART CATH AND CORONARY ANGIOGRAPHY;  Surgeon: Seerat Peaden, MD;  Location: [facility];  Service: Cardiovascular;  Laterality: N/A;  . LEFT HEART CATHETERIZATION WITH CORONARY ANGIOGRAM N/A 07/13/2012   Procedure: LEFT HEART CATHETERIZATION WITH CORONARY ANGIOGRAM;  Surgeon: Jagadeesh R Kire Ferg, MD;  Location: [facility];  Service: Cardiovascular;  Laterality: N/A;  . LEFT HEART CATHETERIZATION WITH CORONARY ANGIOGRAM N/A 07/11/2014   Procedure: LEFT HEART CATHETERIZATION WITH CORONARY ANGIOGRAM;  Surgeon: Jagadeesh R Madisan Bice, MD;  Location: [facility];  Service: Cardiovascular;  Laterality: N/A;  . RETINAL DETACHMENT SURGERY Left 2010  . RETINAL LASER PROCEDURE Right 2010  . THYROIDECTOMY, PARTIAL  ~ 1979  . TONSILLECTOMY AND ADENOIDECTOMY  1977   Social History   Tobacco Use  . Smoking status: Never Smoker  . Smokeless tobacco: Never Used  Substance Use Topics  . Alcohol use: Yes    Comment: 2-3 beers per week    Family History  Problem Relation Age of Onset  .   Stroke Mother   . Atrial fibrillation Mother   . Atrial fibrillation Father   . Heart disease Sister    Review of Systems  Constitution: Positive for malaise/fatigue.  Cardiovascular: Positive for chest pain, dyspnea on exertion and palpitations. Negative for leg swelling.  Gastrointestinal: Negative for melena.   Objective:  Blood pressure 108/67, pulse 80, temperature 97.9 F (36.6  C), temperature source Temporal, resp. rate 16, height 5' 9" (1.753 m), weight 205 lb 12.8 oz (93.4 kg), SpO2 96 %. Body mass index is 30.39 kg/m.  Vitals with BMI 08/11/2019 07/26/2019 07/18/2019  Height 5' 9" 5' 9" 5' 9"  Weight 205 lbs 13 oz 201 lbs 204 lbs  BMI 30.38 64.68 03.21  Systolic 224 825 003  Diastolic 67 72 70  Pulse 80 77 84     Physical Exam  Cardiovascular: Normal rate, regular rhythm, normal heart sounds, intact distal pulses and normal pulses. Exam reveals no gallop.  No murmur heard. No leg edema, no JVD.  Pulmonary/Chest: Effort normal and breath sounds normal.  Abdominal: Soft. Bowel sounds are normal.  Skin: Skin is warm and dry.   Radiology: CXR PA/LAT 01/25/2018: Cardiac shadows within normal limits. The lungs are well aerated bilaterally. No focal infiltrate or sizable effusion is seen. No acute bony abnormality is noted. Small pulmonary nodule is noted in the right mid lung laterally similar to that seen on prior CT examination.  IMPRESSION: No active cardiopulmonary disease.  Laboratory examination:   CMP Latest Ref Rng & Units 06/26/2017 10/29/2015 06/15/2015  Glucose 65 - 99 mg/dL 203(H) 135(H) 195(H)  BUN 6 - 20 mg/dL _0 Creatinine 0.61 - 1.24 mg/dL 0.91 0.97 0.87  Sodium 135 - 145 mmol/L 137 138 137  Potassium 3.5 - 5.1 mmol/L 4.2 4.8 4.8  Chloride 101 - 111 mmol/L 104 105 105  CO2 22 - 32 mmol/L _1 Calcium 8.9 - 10.3 mg/dL 9.2 9.6 9.1  Total Protein 6.5 - 8.1 g/dL - 6.7 -  Total Bilirubin 0.3 - 1.2 mg/dL - 1.0 -  Alkaline Phos 38 - 126 U/L - 78 -  AST 15 - 41 U/L - 32 -  ALT 17 - 63 U/L - 32 -   CBC Latest Ref Rng & Units 06/26/2017 10/29/2015 06/15/2015  WBC 4.0 - 10.5 K/uL 8.5 9.0 6.2  Hemoglobin 13.0 - 17.0 g/dL 15.7 14.9 15.2  Hematocrit 39.0 - 52.0 % 44.6 43.5 44.4  Platelets 150 - 400 K/uL 236 235 205   Lipid Panel     Component Value Date/Time   CHOL 131 06/15/2015 0651   TRIG 76 06/15/2015 0651   HDL 55 06/15/2015  0651   CHOLHDL 2.4 06/15/2015 0651   VLDL 15 06/15/2015 0651   LDLCALC 61 06/15/2015 0651   HEMOGLOBIN A1C Lab Results  Component Value Date   HGBA1C 8.0 (H) 01/04/2011   MPG 183 (H) 01/04/2011   TSH No results for input(s): TSH in the last 8760 hours.  External labs:  Cholesterol, total 143.000 01/18/2019 HDL 52.000 01/18/2019 LDL 32.000 01/18/2019 Triglycerides 293.000 01/18/2019  A1C 8.700 07/05/2019  Creatinine, Serum 0.970 01/18/2019 Potassium 4.400 01/18/2019 ALT (SGPT) 24.000 01/18/2019 TSH 1.930 01/18/2019  Hemoglobin 15.100 12/16/2017 INR 0.940 06/26/2017 Platelets 236.000 06/26/2017  Labs 12/16/2017: Total cholesterol 109, triglycerides 110, HDL 52, LDL 56.    TSH normal.  A1c 7.7%.  Serum glucose 174, BUN 15, creatinine 0.85, which eGFR greater than 60 ML, CMP otherwise normal.  CBC normal.  PRN Meds:. Medications Discontinued During This Encounter  Medication Reason  . amLODipine (NORVASC) 5 MG tablet Reorder  . REPATHA SURECLICK 140 MG/ML SOAJ P&T Policy: Therapeutic Substitute   Current Meds  Medication Sig  . albuterol (VENTOLIN HFA) 108 (90 Base) MCG/ACT inhaler as needed.  . amLODipine (NORVASC) 5 MG tablet Take 1 tablet (5 mg total) by mouth daily.  . aspirin 81 MG tablet Take 1 tablet (81 mg total) by mouth daily.  . clopidogrel (PLAVIX) 75 MG tablet TAKE 1 TABLET BY MOUTH ONCE DAILY  . insulin aspart (NOVOLOG) 100 UNIT/ML injection Inject into the skin See admin instructions. Uses via Insulin Pump  . losartan (COZAAR) 25 MG tablet TAKE HALF A TABLET BY MOUTH DAILY  . metoprolol tartrate (LOPRESSOR) 25 MG tablet TAKE 1/2 TABLET BY MOUTH TWICE A DAY AS NEEDED  . Multiple Vitamins-Minerals (CENTRUM SILVER 50+MEN PO) Take 1 tablet by mouth daily.  . nitroGLYCERIN (NITROSTAT) 0.4 MG SL tablet Place 1 tablet (0.4 mg total) under the tongue every 5 (five) minutes as needed for chest pain.  . ranolazine (RANEXA) 500 MG 12 hr tablet Take 1 tablet (500 mg total) by  mouth 2 (two) times daily.  . [DISCONTINUED] amLODipine (NORVASC) 5 MG tablet Take 1 tablet (5 mg total) by mouth daily.   Cardiac Studies:   Lexiscan Sestamibi stress test 09/04/2017:  1. Pharmacologic stress testing was performed with intravenous administration of .4 mg of Lexiscan over a 10-15 seconds infusion. Stress symptoms included dyspnea and abdominal pain. Normal blood pressure. Exercise capacity not assessed. Stress EKG is non diagnostic for ischemia as it is a pharmacologic stress.  2. The overall quality of the study is excellent. There is no evidence of abnormal lung activity. Stress and rest SPECT images demonstrate homogeneous tracer distribution throughout the myocardium. Gated SPECT imaging reveals normal myocardial thickening and wall motion. The left ventricular ejection fraction was normal (81%).   3. Low risk study.  Coronary angiogram 06/26/2017: Normal LV systolic function, no regional wall motion of a mallet he. Widely patent mid LAD stent and mid circumflex stent and balloon angioplasty site to the ramus intermediate. The mid to distal LAD has a focal 60-70% stenosis. Apical LAD has a ulcerated 95-99% stenosis.  Echocardiogram 07/20/2019:  Left ventricle cavity is normal in size. Mild concentric hypertrophy of the left ventricle. Normal LV systolic function with EF 65%. Normal global wall motion. Normal diastolic filling pattern. Calculated EF 65%.  Mild (Grade I) mitral regurgitation.  Mild tricuspid regurgitation. Estimated pulmonary artery systolic pressure is 26 mmHg.  No significant change compared to previous study on 08/27/2017.  72-hour Holter monitor 2/8-2/04/2020:  Normal sinus rhythm.  Minimum heart rate 66 bpm, max heart rate 113 bpm.  4.73% tachycardia burden.  Occasional PVC (1.32% burden) in the form of isolated, couplets, and in a bigeminal pattern.  Rare PAC.  No reported symptoms.  No A. fib or SVT was noted.  CPX 08/01/2019: Exercise testing with gas  exchange demonstrates low-normal functional capacity when compared to matched sedentary norms. There are no clear indications for cardiopulmonary limitations. At peak exercise there was normal O2 pulse.  Low normal functional capacity. No evidence of HF limitation. At peak exercise, there was lateral ST depression suggestive of ischemia.  Assessment:     ICD-10-CM   1. Angina pectoris (HCC)  I20.9 EKG 12-Lead  2. Coronary artery disease of native artery of native heart with stable angina pectoris (HCC)  I25.118 Alirocumab (PRALUENT) 150 MG/ML SOAJ    3. Palpitations  R00.2   4. Dyspnea on exertion  R06.00   5. Uncontrolled type 2 diabetes mellitus with hyperglycemia (HCC)  E11.65   6. Hypercholesteremia  E78.00 Alirocumab (PRALUENT) 150 MG/ML SOAJ  7. Type 1 diabetes mellitus with other circulatory complication (HCC)  B71.69    EKG 07/18/2019: Normal sinus rhythm at 76 bpm, normal axis, no evidence of ischemia.   Recommendations:   Travis Fletcher.  is a 64 y.o. male  with known coronary artery disease, has mid LAD stent placed on 06/15/2015 with 3 x 26 mm resolute stent, balloon angioplasty to ramus intermediate on 07/11/2014 and mid circumflex stent with a 4.0 x 15 mm integrity non-DES stent in 2012. He presented on 06/24/2016 with symptoms of unstable angina pectoris, coronary angiography on 06/26/2017 revealed an ulcerated 95% stenosis of the apical LAD and about a 50-60% stenosis of the distal LAD and previously performed angioplasty sites were widely patent. Medical therapy was recommended. He has a history of hyperlipidemia with statin intolerance now on Repatha, uncontrolled type I diabetes follows Dr Dagmar Hait.  I have discussed echo, cardio-pulmonary stress test, and holter monitor results with the patient. Holter monitor showed NSR to sinus tachycardia with 1.3% PVC burden. Palpitations have stopped since being back on Ranexa; also since being on amlodipine, symptoms of  angina has improved but still continues to have marked fatigue and decreased exercise tolerance.  He has abnormal cardiopulmonary stress response. We could certainly opt for repeating cardiac catheterization but I think his symptoms of angina, fatigue are clearly related to endothelial dysfunction from prolonged uncontrolled diabetes in spite of doing his best to control this and also related to uncontrolled lipids, finally he is back on Repatha but now we are having trouble with insurance to get Repatha approved.  I will look and see if Praluent would be appropriate as per his insurance requirements. Lipids were well controlled on Repatha.   He is planning to apply for disability, I do support his disability in view of the fact that he is on maximal medical therapy, in spite of this continues to have chest pain that is not controlled on medications and there is not much we can do with regard to revascularization as major vessels are not involved.  I will see him back in 3 months and if symptoms continue to persist, I have no option but to repeat cardiac catheterization again. Endothelial dysfunction from long standing hyperlipidemia and uncontrolled DM is playing a major role in his presentation.   Adrian Prows, MD, [facility] 08/11/2019, 8:52 PM [facility] [place] Office: [phone]

## 2019-08-11 NOTE — H&P (View-Only) (Signed)
 Primary Physician:  Griffin, Elaine, MD   Patient ID: Travis Harold Rohwer Jr., male    DOB: 04/16/1956, 63 y.o.   MRN: [id number]  Subjective:   No chief complaint on file.   HPI: Travis Harold Huizar Jr.  is a 63 y.o. male male  with known coronary artery disease, has mid LAD stent placed on 06/15/2015 with 3 x 26 mm resolute stent, balloon angioplasty to ramus intermediate on 07/11/2014 and mid circumflex stent with a 4.0 x 15 mm integrity non-DES stent in 2012. He presented on 06/24/2016 with symptoms of unstable angina pectoris, coronary angiography on 06/26/2017 revealed an ulcerated 95% stenosis of the apical LAD and about a 50-60% stenosis of the distal LAD and previously performed angioplasty sites were widely patent. Medical therapy was recommended. He has a history of hyperlipidemia with statin intolerance now on Repatha, uncontrolled type I diabetes follows Dr Jeff Kerr.  Recently had COVID in January, since then having more angina episodes, dyspnea, fatigue. He is also was having issues with Repatha since insurance changed. DM continues to be uncontrolled. Symptoms of angina has improved since being on Amlodipine.   Past Medical History:  Diagnosis Date  . Anginal pain (HCC)   . Arthritis    "hands" (07/11/2014)  . CAD (coronary artery disease), native coronary artery 07/24/2018  . Coronary artery disease   . High cholesterol   . Hypercholesteremia 07/24/2018  . Insulin dependent diabetes mellitus   . Kidney stones    "passed them all"  . Neuromuscular disorder (HCC)    neuropathy  . Prostate cancer (HCC) 2010  . Statin intolerance 07/24/2018  . Uncontrolled diabetes mellitus (HCC) 07/24/2018   Past Surgical History:  Procedure Laterality Date  . BALLOON ANGIOPLASTY, ARTERY  07/13/2012  . CARDIAC CATHETERIZATION N/A 06/15/2015   Procedure: Left Heart Cath and Coronary Angiography;  Surgeon: Dashae Wilcher, MD;  Location: [facility];  Service: Cardiovascular;   Laterality: N/A;  . CARDIAC CATHETERIZATION N/A 06/15/2015   Procedure: Coronary Stent Intervention;  Surgeon: Dafna Romo, MD;  Location: [facility];  Service: Cardiovascular;  Laterality: N/A;  . CATARACT EXTRACTION W/ INTRAOCULAR LENS  IMPLANT, BILATERAL Bilateral ~ 2000  . CORONARY ANGIOPLASTY  07/11/2014  . CORONARY ANGIOPLASTY WITH STENT PLACEMENT  2012   "1"  . EYE SURGERY    . INGUINAL HERNIA REPAIR Left 1987  . INSERTION PROSTATE RADIATION SEED  2010  . LEFT HEART CATH AND CORONARY ANGIOGRAPHY N/A 06/26/2017   Procedure: LEFT HEART CATH AND CORONARY ANGIOGRAPHY;  Surgeon: Lourdes Manning, MD;  Location: [facility];  Service: Cardiovascular;  Laterality: N/A;  . LEFT HEART CATHETERIZATION WITH CORONARY ANGIOGRAM N/A 07/13/2012   Procedure: LEFT HEART CATHETERIZATION WITH CORONARY ANGIOGRAM;  Surgeon: Jagadeesh R Mathews Stuhr, MD;  Location: [facility];  Service: Cardiovascular;  Laterality: N/A;  . LEFT HEART CATHETERIZATION WITH CORONARY ANGIOGRAM N/A 07/11/2014   Procedure: LEFT HEART CATHETERIZATION WITH CORONARY ANGIOGRAM;  Surgeon: Jagadeesh R Martez Weiand, MD;  Location: [facility];  Service: Cardiovascular;  Laterality: N/A;  . RETINAL DETACHMENT SURGERY Left 2010  . RETINAL LASER PROCEDURE Right 2010  . THYROIDECTOMY, PARTIAL  ~ 1979  . TONSILLECTOMY AND ADENOIDECTOMY  1977   Social History   Tobacco Use  . Smoking status: Never Smoker  . Smokeless tobacco: Never Used  Substance Use Topics  . Alcohol use: Yes    Comment: 2-3 beers per week    Family History  Problem Relation Age of Onset  .   Stroke Mother   . Atrial fibrillation Mother   . Atrial fibrillation Father   . Heart disease Sister    Review of Systems  Constitution: Positive for malaise/fatigue.  Cardiovascular: Positive for chest pain, dyspnea on exertion and palpitations. Negative for leg swelling.  Gastrointestinal: Negative for melena.   Objective:  Blood pressure 108/67, pulse 80, temperature 97.9 F (36.6  C), temperature source Temporal, resp. rate 16, height 5' 9" (1.753 m), weight 205 lb 12.8 oz (93.4 kg), SpO2 96 %. Body mass index is 30.39 kg/m.  Vitals with BMI 08/11/2019 07/26/2019 07/18/2019  Height 5' 9" 5' 9" 5' 9"  Weight 205 lbs 13 oz 201 lbs 204 lbs  BMI 30.38 64.68 03.21  Systolic 224 825 003  Diastolic 67 72 70  Pulse 80 77 84     Physical Exam  Cardiovascular: Normal rate, regular rhythm, normal heart sounds, intact distal pulses and normal pulses. Exam reveals no gallop.  No murmur heard. No leg edema, no JVD.  Pulmonary/Chest: Effort normal and breath sounds normal.  Abdominal: Soft. Bowel sounds are normal.  Skin: Skin is warm and dry.   Radiology: CXR PA/LAT 01/25/2018: Cardiac shadows within normal limits. The lungs are well aerated bilaterally. No focal infiltrate or sizable effusion is seen. No acute bony abnormality is noted. Small pulmonary nodule is noted in the right mid lung laterally similar to that seen on prior CT examination.  IMPRESSION: No active cardiopulmonary disease.  Laboratory examination:   CMP Latest Ref Rng & Units 06/26/2017 10/29/2015 06/15/2015  Glucose 65 - 99 mg/dL 203(H) 135(H) 195(H)  BUN 6 - 20 mg/dL _0 Creatinine 0.61 - 1.24 mg/dL 0.91 0.97 0.87  Sodium 135 - 145 mmol/L 137 138 137  Potassium 3.5 - 5.1 mmol/L 4.2 4.8 4.8  Chloride 101 - 111 mmol/L 104 105 105  CO2 22 - 32 mmol/L _1 Calcium 8.9 - 10.3 mg/dL 9.2 9.6 9.1  Total Protein 6.5 - 8.1 g/dL - 6.7 -  Total Bilirubin 0.3 - 1.2 mg/dL - 1.0 -  Alkaline Phos 38 - 126 U/L - 78 -  AST 15 - 41 U/L - 32 -  ALT 17 - 63 U/L - 32 -   CBC Latest Ref Rng & Units 06/26/2017 10/29/2015 06/15/2015  WBC 4.0 - 10.5 K/uL 8.5 9.0 6.2  Hemoglobin 13.0 - 17.0 g/dL 15.7 14.9 15.2  Hematocrit 39.0 - 52.0 % 44.6 43.5 44.4  Platelets 150 - 400 K/uL 236 235 205   Lipid Panel     Component Value Date/Time   CHOL 131 06/15/2015 0651   TRIG 76 06/15/2015 0651   HDL 55 06/15/2015  0651   CHOLHDL 2.4 06/15/2015 0651   VLDL 15 06/15/2015 0651   LDLCALC 61 06/15/2015 0651   HEMOGLOBIN A1C Lab Results  Component Value Date   HGBA1C 8.0 (H) 01/04/2011   MPG 183 (H) 01/04/2011   TSH No results for input(s): TSH in the last 8760 hours.  External labs:  Cholesterol, total 143.000 01/18/2019 HDL 52.000 01/18/2019 LDL 32.000 01/18/2019 Triglycerides 293.000 01/18/2019  A1C 8.700 07/05/2019  Creatinine, Serum 0.970 01/18/2019 Potassium 4.400 01/18/2019 ALT (SGPT) 24.000 01/18/2019 TSH 1.930 01/18/2019  Hemoglobin 15.100 12/16/2017 INR 0.940 06/26/2017 Platelets 236.000 06/26/2017  Labs 12/16/2017: Total cholesterol 109, triglycerides 110, HDL 52, LDL 56.    TSH normal.  A1c 7.7%.  Serum glucose 174, BUN 15, creatinine 0.85, which eGFR greater than 60 ML, CMP otherwise normal.  CBC normal.  PRN Meds:. Medications Discontinued During This Encounter  Medication Reason  . amLODipine (NORVASC) 5 MG tablet Reorder  . REPATHA SURECLICK 140 MG/ML SOAJ P&T Policy: Therapeutic Substitute   Current Meds  Medication Sig  . albuterol (VENTOLIN HFA) 108 (90 Base) MCG/ACT inhaler as needed.  . amLODipine (NORVASC) 5 MG tablet Take 1 tablet (5 mg total) by mouth daily.  . aspirin 81 MG tablet Take 1 tablet (81 mg total) by mouth daily.  . clopidogrel (PLAVIX) 75 MG tablet TAKE 1 TABLET BY MOUTH ONCE DAILY  . insulin aspart (NOVOLOG) 100 UNIT/ML injection Inject into the skin See admin instructions. Uses via Insulin Pump  . losartan (COZAAR) 25 MG tablet TAKE HALF A TABLET BY MOUTH DAILY  . metoprolol tartrate (LOPRESSOR) 25 MG tablet TAKE 1/2 TABLET BY MOUTH TWICE A DAY AS NEEDED  . Multiple Vitamins-Minerals (CENTRUM SILVER 50+MEN PO) Take 1 tablet by mouth daily.  . nitroGLYCERIN (NITROSTAT) 0.4 MG SL tablet Place 1 tablet (0.4 mg total) under the tongue every 5 (five) minutes as needed for chest pain.  . ranolazine (RANEXA) 500 MG 12 hr tablet Take 1 tablet (500 mg total) by  mouth 2 (two) times daily.  . [DISCONTINUED] amLODipine (NORVASC) 5 MG tablet Take 1 tablet (5 mg total) by mouth daily.   Cardiac Studies:   Lexiscan Sestamibi stress test 09/04/2017:  1. Pharmacologic stress testing was performed with intravenous administration of .4 mg of Lexiscan over a 10-15 seconds infusion. Stress symptoms included dyspnea and abdominal pain. Normal blood pressure. Exercise capacity not assessed. Stress EKG is non diagnostic for ischemia as it is a pharmacologic stress.  2. The overall quality of the study is excellent. There is no evidence of abnormal lung activity. Stress and rest SPECT images demonstrate homogeneous tracer distribution throughout the myocardium. Gated SPECT imaging reveals normal myocardial thickening and wall motion. The left ventricular ejection fraction was normal (81%).   3. Low risk study.  Coronary angiogram 06/26/2017: Normal LV systolic function, no regional wall motion of a mallet he. Widely patent mid LAD stent and mid circumflex stent and balloon angioplasty site to the ramus intermediate. The mid to distal LAD has a focal 60-70% stenosis. Apical LAD has a ulcerated 95-99% stenosis.  Echocardiogram 07/20/2019:  Left ventricle cavity is normal in size. Mild concentric hypertrophy of the left ventricle. Normal LV systolic function with EF 65%. Normal global wall motion. Normal diastolic filling pattern. Calculated EF 65%.  Mild (Grade I) mitral regurgitation.  Mild tricuspid regurgitation. Estimated pulmonary artery systolic pressure is 26 mmHg.  No significant change compared to previous study on 08/27/2017.  72-hour Holter monitor 2/8-2/04/2020:  Normal sinus rhythm.  Minimum heart rate 66 bpm, max heart rate 113 bpm.  4.73% tachycardia burden.  Occasional PVC (1.32% burden) in the form of isolated, couplets, and in a bigeminal pattern.  Rare PAC.  No reported symptoms.  No A. fib or SVT was noted.  CPX 08/01/2019: Exercise testing with gas  exchange demonstrates low-normal functional capacity when compared to matched sedentary norms. There are no clear indications for cardiopulmonary limitations. At peak exercise there was normal O2 pulse.  Low normal functional capacity. No evidence of HF limitation. At peak exercise, there was lateral ST depression suggestive of ischemia.  Assessment:     ICD-10-CM   1. Angina pectoris (HCC)  I20.9 EKG 12-Lead  2. Coronary artery disease of native artery of native heart with stable angina pectoris (HCC)  I25.118 Alirocumab (PRALUENT) 150 MG/ML SOAJ    3. Palpitations  R00.2   4. Dyspnea on exertion  R06.00   5. Uncontrolled type 2 diabetes mellitus with hyperglycemia (HCC)  E11.65   6. Hypercholesteremia  E78.00 Alirocumab (PRALUENT) 150 MG/ML SOAJ  7. Type 1 diabetes mellitus with other circulatory complication (HCC)  B71.69    EKG 07/18/2019: Normal sinus rhythm at 76 bpm, normal axis, no evidence of ischemia.   Recommendations:   Travis Fletcher.  is a 64 y.o. male  with known coronary artery disease, has mid LAD stent placed on 06/15/2015 with 3 x 26 mm resolute stent, balloon angioplasty to ramus intermediate on 07/11/2014 and mid circumflex stent with a 4.0 x 15 mm integrity non-DES stent in 2012. He presented on 06/24/2016 with symptoms of unstable angina pectoris, coronary angiography on 06/26/2017 revealed an ulcerated 95% stenosis of the apical LAD and about a 50-60% stenosis of the distal LAD and previously performed angioplasty sites were widely patent. Medical therapy was recommended. He has a history of hyperlipidemia with statin intolerance now on Repatha, uncontrolled type I diabetes follows Dr Dagmar Hait.  I have discussed echo, cardio-pulmonary stress test, and holter monitor results with the patient. Holter monitor showed NSR to sinus tachycardia with 1.3% PVC burden. Palpitations have stopped since being back on Ranexa; also since being on amlodipine, symptoms of  angina has improved but still continues to have marked fatigue and decreased exercise tolerance.  He has abnormal cardiopulmonary stress response. We could certainly opt for repeating cardiac catheterization but I think his symptoms of angina, fatigue are clearly related to endothelial dysfunction from prolonged uncontrolled diabetes in spite of doing his best to control this and also related to uncontrolled lipids, finally he is back on Repatha but now we are having trouble with insurance to get Repatha approved.  I will look and see if Praluent would be appropriate as per his insurance requirements. Lipids were well controlled on Repatha.   He is planning to apply for disability, I do support his disability in view of the fact that he is on maximal medical therapy, in spite of this continues to have chest pain that is not controlled on medications and there is not much we can do with regard to revascularization as major vessels are not involved.  I will see him back in 3 months and if symptoms continue to persist, I have no option but to repeat cardiac catheterization again. Endothelial dysfunction from long standing hyperlipidemia and uncontrolled DM is playing a major role in his presentation.   Adrian Prows, MD, [facility] 08/11/2019, 8:52 PM [facility]. [place] Office: [phone]

## 2019-08-12 ENCOUNTER — Other Ambulatory Visit: Payer: Self-pay | Admitting: Cardiology

## 2019-08-15 ENCOUNTER — Telehealth: Payer: Self-pay

## 2019-08-15 DIAGNOSIS — I209 Angina pectoris, unspecified: Secondary | ICD-10-CM

## 2019-08-15 DIAGNOSIS — I25118 Atherosclerotic heart disease of native coronary artery with other forms of angina pectoris: Secondary | ICD-10-CM

## 2019-08-15 NOTE — Telephone Encounter (Signed)
Yes please. Orders for lab done

## 2019-08-15 NOTE — Telephone Encounter (Signed)
Patient wants to proceed with coronary angiogram. Please see my last OV noted for complete details. Lab orders placed  Adrian Prows, MD, [facility] 08/15/2019, 9:30 PM [facility]. [place] Office: [phone]

## 2019-08-16 NOTE — Telephone Encounter (Signed)
Pt scheduled for cath on 08/30/2019 @ 1pm. He stated he had covid, based on that, does he need to have covid testing again before procedure. Please advise

## 2019-08-20 LAB — BASIC METABOLIC PANEL
BUN/Creatinine Ratio: 13 (ref 10–24)
BUN: 14 mg/dL (ref 8–27)
CO2: 23 mmol/L (ref 20–29)
Calcium: 9.7 mg/dL (ref 8.6–10.2)
Chloride: 102 mmol/L (ref 96–106)
Creatinine, Ser: 1.04 mg/dL (ref 0.76–1.27)
GFR calc Af Amer: 89 mL/min/{1.73_m2} (ref >59)
GFR calc non Af Amer: 77 mL/min/{1.73_m2} (ref >59)
Glucose: 97 mg/dL (ref 65–99)
Potassium: 4.7 mmol/L (ref 3.5–5.2)
Sodium: 140 mmol/L (ref 134–144)

## 2019-08-20 LAB — CBC
Hematocrit: 48.9 % (ref 37.5–51.0)
Hemoglobin: 16.5 g/dL (ref 13.0–17.7)
MCH: 32.4 pg (ref 26.6–33.0)
MCHC: 33.7 g/dL (ref 31.5–35.7)
MCV: 96 fL (ref 79–97)
Platelets: 240 10*3/uL (ref 150–450)
RBC: 5.1 x10E6/uL (ref 4.14–5.80)
RDW: 11.9 % (ref 11.6–15.4)
WBC: 5.7 10*3/uL (ref 3.4–10.8)

## 2019-08-25 ENCOUNTER — Ambulatory Visit: Payer: 59 | Admitting: Cardiology

## 2019-08-26 ENCOUNTER — Other Ambulatory Visit (HOSPITAL_COMMUNITY)
Admission: RE | Admit: 2019-08-26 | Discharge: 2019-08-26 | Disposition: A | Discharge status: Home / Self Care | Payer: 59 | Source: Ambulatory Visit | Attending: Cardiology | Admitting: Cardiology

## 2019-08-26 DIAGNOSIS — Z01812 Encounter for preprocedural laboratory examination: Secondary | ICD-10-CM | POA: Insufficient documentation

## 2019-08-26 DIAGNOSIS — Z20822 Contact with and (suspected) exposure to covid-19: Secondary | ICD-10-CM | POA: Insufficient documentation

## 2019-08-26 LAB — SARS CORONAVIRUS 2 (TAT 6-24 HRS): SARS Coronavirus 2: NEGATIVE

## 2019-08-30 ENCOUNTER — Ambulatory Visit (HOSPITAL_COMMUNITY)
Admission: RE | Admit: 2019-08-30 | Discharge: 2019-08-30 | Disposition: A | Discharge status: Home / Self Care | Payer: 59 | Attending: Cardiology | Admitting: Cardiology

## 2019-08-30 ENCOUNTER — Other Ambulatory Visit: Payer: Self-pay

## 2019-08-30 ENCOUNTER — Ambulatory Visit (HOSPITAL_COMMUNITY)
Admission: RE | Disposition: A | Discharge status: Home / Self Care | Payer: Self-pay | Source: Home / Self Care | Attending: Cardiology

## 2019-08-30 DIAGNOSIS — Z7982 Long term (current) use of aspirin: Secondary | ICD-10-CM | POA: Diagnosis not present

## 2019-08-30 DIAGNOSIS — I209 Angina pectoris, unspecified: Secondary | ICD-10-CM

## 2019-08-30 DIAGNOSIS — E1065 Type 1 diabetes mellitus with hyperglycemia: Secondary | ICD-10-CM | POA: Diagnosis not present

## 2019-08-30 DIAGNOSIS — E7800 Pure hypercholesterolemia, unspecified: Secondary | ICD-10-CM | POA: Insufficient documentation

## 2019-08-30 DIAGNOSIS — Z79899 Other long term (current) drug therapy: Secondary | ICD-10-CM | POA: Insufficient documentation

## 2019-08-30 DIAGNOSIS — E785 Hyperlipidemia, unspecified: Secondary | ICD-10-CM | POA: Insufficient documentation

## 2019-08-30 DIAGNOSIS — Z7902 Long term (current) use of antithrombotics/antiplatelets: Secondary | ICD-10-CM | POA: Diagnosis not present

## 2019-08-30 DIAGNOSIS — Z955 Presence of coronary angioplasty implant and graft: Secondary | ICD-10-CM | POA: Insufficient documentation

## 2019-08-30 DIAGNOSIS — Z794 Long term (current) use of insulin: Secondary | ICD-10-CM | POA: Diagnosis not present

## 2019-08-30 DIAGNOSIS — Z8616 Personal history of COVID-19: Secondary | ICD-10-CM | POA: Diagnosis not present

## 2019-08-30 DIAGNOSIS — R0600 Dyspnea, unspecified: Secondary | ICD-10-CM | POA: Diagnosis not present

## 2019-08-30 DIAGNOSIS — R002 Palpitations: Secondary | ICD-10-CM | POA: Insufficient documentation

## 2019-08-30 DIAGNOSIS — Z8546 Personal history of malignant neoplasm of prostate: Secondary | ICD-10-CM | POA: Diagnosis not present

## 2019-08-30 DIAGNOSIS — I25119 Atherosclerotic heart disease of native coronary artery with unspecified angina pectoris: Secondary | ICD-10-CM | POA: Diagnosis present

## 2019-08-30 DIAGNOSIS — IMO0002 Reserved for concepts with insufficient information to code with codable children: Secondary | ICD-10-CM | POA: Diagnosis present

## 2019-08-30 HISTORY — PX: LEFT HEART CATH AND CORONARY ANGIOGRAPHY: CATH118249

## 2019-08-30 LAB — GLUCOSE, CAPILLARY
Glucose-Capillary: 161 mg/dL — ABNORMAL HIGH (ref 70–99)
Glucose-Capillary: 270 mg/dL — ABNORMAL HIGH (ref 70–99)
Glucose-Capillary: 273 mg/dL — ABNORMAL HIGH (ref 70–99)

## 2019-08-30 SURGERY — LEFT HEART CATH AND CORONARY ANGIOGRAPHY
Anesthesia: LOCAL

## 2019-08-30 MED ORDER — ASPIRIN 81 MG PO CHEW
81.0000 mg | CHEWABLE_TABLET | ORAL | Status: AC
  Administered 2019-08-30: 81 mg via ORAL
  Filled 2019-08-30: qty 1

## 2019-08-30 MED ORDER — SODIUM CHLORIDE 0.9 % WEIGHT BASED INFUSION: 1.0000 mL/kg/h | INTRAVENOUS | Status: DC

## 2019-08-30 MED ORDER — LIDOCAINE HCL (PF) 1 % IJ SOLN
INTRAMUSCULAR | Status: DC | PRN
  Administered 2019-08-30: 3 mL

## 2019-08-30 MED ORDER — IOHEXOL 350 MG/ML SOLN
INTRAVENOUS | Status: DC | PRN
  Administered 2019-08-30: 55 mL

## 2019-08-30 MED ORDER — SODIUM CHLORIDE 0.9 % IV SOLN: 250.0000 mL | INTRAVENOUS | Status: DC | PRN

## 2019-08-30 MED ORDER — SODIUM CHLORIDE 0.9% FLUSH: 3.0000 mL | Freq: Two times a day (BID) | INTRAVENOUS | Status: DC

## 2019-08-30 MED ORDER — MIDAZOLAM HCL 2 MG/2ML IJ SOLN
INTRAMUSCULAR | Status: DC | PRN
  Administered 2019-08-30: 2 mg via INTRAVENOUS
  Administered 2019-08-30: 1 mg via INTRAVENOUS

## 2019-08-30 MED ORDER — HEPARIN (PORCINE) IN NACL 1000-0.9 UT/500ML-% IV SOLN
INTRAVENOUS | Status: AC
  Filled 2019-08-30: qty 1000

## 2019-08-30 MED ORDER — HYDRALAZINE HCL 20 MG/ML IJ SOLN: 10.0000 mg | INTRAMUSCULAR | Status: DC | PRN

## 2019-08-30 MED ORDER — ACETAMINOPHEN 325 MG PO TABS: 650.0000 mg | ORAL_TABLET | ORAL | Status: DC | PRN

## 2019-08-30 MED ORDER — ONDANSETRON HCL 4 MG/2ML IJ SOLN: 4.0000 mg | Freq: Four times a day (QID) | INTRAMUSCULAR | Status: DC | PRN

## 2019-08-30 MED ORDER — NITROGLYCERIN 1 MG/10 ML FOR IR/CATH LAB
INTRA_ARTERIAL | Status: DC | PRN
  Administered 2019-08-30: 200 ug

## 2019-08-30 MED ORDER — LIDOCAINE HCL (PF) 1 % IJ SOLN
INTRAMUSCULAR | Status: AC
  Filled 2019-08-30: qty 30

## 2019-08-30 MED ORDER — FENTANYL CITRATE (PF) 100 MCG/2ML IJ SOLN
INTRAMUSCULAR | Status: AC
  Filled 2019-08-30: qty 2

## 2019-08-30 MED ORDER — HEPARIN SODIUM (PORCINE) 1000 UNIT/ML IJ SOLN
INTRAMUSCULAR | Status: DC | PRN
  Administered 2019-08-30: 5000 [IU] via INTRAVENOUS

## 2019-08-30 MED ORDER — HEPARIN (PORCINE) IN NACL 1000-0.9 UT/500ML-% IV SOLN
INTRAVENOUS | Status: DC | PRN
  Administered 2019-08-30: 500 mL

## 2019-08-30 MED ORDER — SODIUM CHLORIDE 0.9% FLUSH: 3.0000 mL | INTRAVENOUS | Status: DC | PRN

## 2019-08-30 MED ORDER — MIDAZOLAM HCL 2 MG/2ML IJ SOLN
INTRAMUSCULAR | Status: AC
  Filled 2019-08-30: qty 2

## 2019-08-30 MED ORDER — FENTANYL CITRATE (PF) 100 MCG/2ML IJ SOLN
INTRAMUSCULAR | Status: DC | PRN
  Administered 2019-08-30: 50 ug via INTRAVENOUS
  Administered 2019-08-30: 25 ug via INTRAVENOUS

## 2019-08-30 MED ORDER — VERAPAMIL HCL 2.5 MG/ML IV SOLN
INTRAVENOUS | Status: AC
  Filled 2019-08-30: qty 2

## 2019-08-30 MED ORDER — SODIUM CHLORIDE 0.9 % WEIGHT BASED INFUSION
3.0000 mL/kg/h | INTRAVENOUS | Status: DC
  Administered 2019-08-30: 3 mL/kg/h via INTRAVENOUS

## 2019-08-30 MED ORDER — NITROGLYCERIN 1 MG/10 ML FOR IR/CATH LAB
INTRA_ARTERIAL | Status: AC
  Filled 2019-08-30: qty 10

## 2019-08-30 MED ORDER — HEPARIN SODIUM (PORCINE) 1000 UNIT/ML IJ SOLN
INTRAMUSCULAR | Status: AC
  Filled 2019-08-30: qty 1

## 2019-08-30 SURGICAL SUPPLY — 8 items
CATH OPTITORQUE TIG 4.0 5F (CATHETERS) ×1 IMPLANT
DEVICE RAD COMP TR BAND LRG (VASCULAR PRODUCTS) ×1 IMPLANT
GLIDESHEATH SLEND A-KIT 6F 22G (SHEATH) ×1 IMPLANT
KIT HEART LEFT (KITS) ×2 IMPLANT
PACK CARDIAC CATHETERIZATION (CUSTOM PROCEDURE TRAY) ×2 IMPLANT
TRANSDUCER W/STOPCOCK (MISCELLANEOUS) ×2 IMPLANT
TUBING CIL FLEX 10 FLL-RA (TUBING) ×2 IMPLANT
WIRE HI TORQ VERSACORE-J 145CM (WIRE) ×1 IMPLANT

## 2019-08-30 NOTE — Progress Notes (Signed)
Patient resumed his insulin pump

## 2019-08-30 NOTE — Discharge Instructions (Signed)
Radial Site Care  This sheet gives you information about how to care for yourself after your procedure. Your health care provider may also give you more specific instructions. If you have problems or questions, contact your health care provider. What can I expect after the procedure? After the procedure, it is common to have:  Bruising and tenderness at the catheter insertion area. Follow these instructions at home: Medicines  Take over-the-counter and prescription medicines only as told by your health care provider. Insertion site care  Follow instructions from your health care provider about how to take care of your insertion site. Make sure you: ? Wash your hands with soap and water before you change your bandage (dressing). If soap and water are not available, use hand sanitizer. ? Change your dressing as told by your health care provider. ? Leave stitches (sutures), skin glue, or adhesive strips in place. These skin closures may need to stay in place for 2 weeks or longer. If adhesive strip edges start to loosen and curl up, you may trim the loose edges. Do not remove adhesive strips completely unless your health care provider tells you to do that.  Check your insertion site every day for signs of infection. Check for: ? Redness, swelling, or pain. ? Fluid or blood. ? Pus or a bad smell. ? Warmth.  Do not take baths, swim, or use a hot tub until your health care provider approves.  You may shower 24-48 hours after the procedure, or as directed by your health care provider. ? Remove the dressing and gently wash the site with plain soap and water. ? Pat the area dry with a clean towel. ? Do not rub the site. That could cause bleeding.  Do not apply powder or lotion to the site. Activity   For 24 hours after the procedure, or as directed by your health care provider: ? Do not flex or bend the affected arm. ? Do not push or pull heavy objects with the affected arm. ? Do not  drive yourself home from the hospital or clinic. You may drive 24 hours after the procedure unless your health care provider tells you not to. ? Do not operate machinery or power tools.  Do not lift anything that is heavier than 10 lb (4.5 kg), or the limit that you are told, until your health care provider says that it is safe.  Ask your health care provider when it is okay to: ? Return to work or school. ? Resume usual physical activities or sports. ? Resume sexual activity. General instructions  If the catheter site starts to bleed, raise your arm and put firm pressure on the site. If the bleeding does not stop, get help right away. This is a medical emergency.  If you went home on the same day as your procedure, a responsible adult should be with you for the first 24 hours after you arrive home.  Keep all follow-up visits as told by your health care provider. This is important. Contact a health care provider if:  You have a fever.  You have redness, swelling, or yellow drainage around your insertion site. Get help right away if:  You have unusual pain at the radial site.  The catheter insertion area swells very fast.  The insertion area is bleeding, and the bleeding does not stop when you hold steady pressure on the area.  Your arm or hand becomes pale, cool, tingly, or numb. These symptoms may represent a serious problem   that is an emergency. Do not wait to see if the symptoms will go away. Get medical help right away. Call your local emergency services (911 in the [place] Do not drive yourself to the hospital. Summary  After the procedure, it is common to have bruising and tenderness at the site.  Follow instructions from your health care provider about how to take care of your radial site wound. Check the wound every day for signs of infection.  Do not lift anything that is heavier than 10 lb (4.5 kg), or the limit that you are told, until your health care provider says  that it is safe. This information is not intended to replace advice given to you by your health care provider. Make sure you discuss any questions you have with your health care provider. Document Revised: 07/01/2017 Document Reviewed: 07/01/2017 Elsevier Patient Education  2020 [employer]  

## 2019-08-30 NOTE — Interval H&P Note (Signed)
History and Physical Interval Note:  08/30/2019 4:50 PM  Travis Fletcher.  has presented today for surgery, with the diagnosis of cad - angina.  The various methods of treatment have been discussed with the patient and family. After consideration of risks, benefits and other options for treatment, the patient has consented to  Procedure(s): LEFT HEART CATH AND CORONARY ANGIOGRAPHY (N/A) and possible angioplasty as a surgical intervention.  The patient's history has been reviewed, patient examined, no change in status, stable for surgery.  I have reviewed the patient's chart and labs.  Questions were answered to the patient's satisfaction.     Adrian Prows

## 2019-08-31 MED FILL — Verapamil HCl IV Soln 2.5 MG/ML: INTRAVENOUS | Qty: 2 | Status: AC

## 2019-09-14 ENCOUNTER — Telehealth: Payer: Self-pay

## 2019-09-23 NOTE — Telephone Encounter (Signed)
error 

## 2019-10-12 ENCOUNTER — Other Ambulatory Visit: Payer: Self-pay | Admitting: Cardiology

## 2019-11-11 NOTE — Progress Notes (Signed)
Primary Physician/Referring:  Kelton Pillar, MD  Patient ID: Travis Kung., male    DOB: 1956/03/22, 64 y.o.   MRN: [id number]  Chief Complaint  Patient presents with  . Chest Pain  . Follow-up    3 month   HPI:    Travis Fletcher.  is a 64 y.o. male  with known coronary artery disease, has mid LAD stent placed on 06/15/2015 with 3 x 26 mm resolute stent, balloon angioplasty to ramus intermediate on 07/11/2014 and mid circumflex stent with a 4.0 x 15 mm integrity non-DES stent in 2012. He presented on 06/24/2016 with symptoms of unstable angina pectoris, coronary angiography on 06/26/2017 revealed an ulcerated 95% stenosis of the apical LAD and about a 50-60% stenosis of the distal LAD and previously performed angioplasty sites were widely patent. Medical therapy was recommended. He has a history of hyperlipidemia with statin intolerance now on Repatha, uncontrolled type I diabetes follows Dr Dagmar Hait.  Recently had COVID in January, since then having more angina episodes, dyspnea, fatigue.  DM continues to be uncontrolled. Symptoms of angina has improved since being on Amlodipine.  His main complaint is continued anginal control with exertion and also marked dyspnea on exertion and fatigue along with associated frequent episodes of palpitations.  Past Medical History:  Diagnosis Date  . Anginal pain ([place])   . Arthritis    "hands" (07/11/2014)  . CAD (coronary artery disease), native coronary artery 07/24/2018  . Coronary artery disease   . High cholesterol   . Hypercholesteremia 07/24/2018  . Insulin dependent diabetes mellitus   . Kidney stones    "passed them all"  . Neuromuscular disorder (HCC)    neuropathy  . Prostate cancer ([place]) 2010  . Statin intolerance 07/24/2018  . Uncontrolled diabetes mellitus ([place]) 07/24/2018   Past Surgical History:  Procedure Laterality Date  . BALLOON ANGIOPLASTY, ARTERY  07/13/2012  . CARDIAC CATHETERIZATION N/A  06/15/2015   Procedure: Left Heart Cath and Coronary Angiography;  Surgeon: Adrian Prows, MD;  Location: [facility];  Service: Cardiovascular;  Laterality: N/A;  . CARDIAC CATHETERIZATION N/A 06/15/2015   Procedure: Coronary Stent Intervention;  Surgeon: Adrian Prows, MD;  Location: [facility];  Service: Cardiovascular;  Laterality: N/A;  . CATARACT EXTRACTION W/ INTRAOCULAR LENS  IMPLANT, BILATERAL Bilateral ~ 2000  . CORONARY ANGIOPLASTY  07/11/2014  . CORONARY ANGIOPLASTY WITH STENT PLACEMENT  2012   "1"  . EYE SURGERY    . INGUINAL HERNIA REPAIR Left 1987  . INSERTION PROSTATE RADIATION SEED  2010  . LEFT HEART CATH AND CORONARY ANGIOGRAPHY N/A 06/26/2017   Procedure: LEFT HEART CATH AND CORONARY ANGIOGRAPHY;  Surgeon: Adrian Prows, MD;  Location: [facility];  Service: Cardiovascular;  Laterality: N/A;  . LEFT HEART CATH AND CORONARY ANGIOGRAPHY N/A 08/30/2019   Procedure: LEFT HEART CATH AND CORONARY ANGIOGRAPHY;  Surgeon: Adrian Prows, MD;  Location: [facility];  Service: Cardiovascular;  Laterality: N/A;  . LEFT HEART CATHETERIZATION WITH CORONARY ANGIOGRAM N/A 07/13/2012   Procedure: LEFT HEART CATHETERIZATION WITH CORONARY ANGIOGRAM;  Surgeon: Laverda Page, MD;  Location: [facility] CATH LAB;  Service: Cardiovascular;  Laterality: N/A;  . LEFT HEART CATHETERIZATION WITH CORONARY ANGIOGRAM N/A 07/11/2014   Procedure: LEFT HEART CATHETERIZATION WITH CORONARY ANGIOGRAM;  Surgeon: Laverda Page, MD;  Location: [facility] CATH LAB;  Service: Cardiovascular;  Laterality: N/A;  . RETINAL DETACHMENT SURGERY Left 2010  . RETINAL LASER PROCEDURE Right 2010  .  THYROIDECTOMY, PARTIAL  ~ 1979  . TONSILLECTOMY AND ADENOIDECTOMY  1977   Family History  Problem Relation Age of Onset  . Stroke Mother   . Atrial fibrillation Mother   . Atrial fibrillation Father   . Heart disease Sister     Social History   Tobacco Use  . Smoking status: Never Smoker  . Smokeless tobacco: Never Used   Substance Use Topics  . Alcohol use: Yes    Comment: 2-3 beers per week   Marital Status: Married  ROS  Review of Systems  Cardiovascular: Positive for chest pain, dyspnea on exertion and palpitations. Negative for leg swelling and syncope.   Objective  Blood pressure 131/76, pulse 79, resp. rate 16, height 5' 9" (1.753 m), weight 204 lb (92.5 kg), SpO2 95 %.  Vitals with BMI 11/14/2019 08/30/2019 08/30/2019  Height 5' 9" - -  Weight 204 lbs - -  BMI 35.57 - -  Systolic 322 025 427  Diastolic 76 68 53  Pulse 79 90 88     Physical Exam  Constitutional: No distress.  Neck: No JVD present.  Cardiovascular: Normal rate, regular rhythm, normal heart sounds and intact distal pulses.  No murmur heard. Pulmonary/Chest: Effort normal and breath sounds normal. He has no wheezes. He has no rales.  Musculoskeletal:        General: No edema.  Nursing note and vitals reviewed.  Laboratory examination:   Recent Labs    08/19/19 1002  NA 140  K 4.7  CL 102  CO2 23  GLUCOSE 97  BUN 14  CREATININE 1.04  CALCIUM 9.7  GFRNONAA 77  GFRAA 89   CrCl cannot be calculated (Patient's most recent lab result is older than the maximum 21 days allowed.).  CMP Latest Ref Rng & Units 08/19/2019 06/26/2017 10/29/2015  Glucose 65 - 99 mg/dL 97 203(H) 135(H)  BUN 8 - 27 mg/dL _0 Creatinine 0.76 - 1.27 mg/dL 1.04 0.91 0.97  Sodium 134 - 144 mmol/L 140 137 138  Potassium 3.5 - 5.2 mmol/L 4.7 4.2 4.8  Chloride 96 - 106 mmol/L 102 104 105  CO2 20 - 29 mmol/L _1 Calcium 8.6 - 10.2 mg/dL 9.7 9.2 9.6  Total Protein 6.5 - 8.1 g/dL - - 6.7  Total Bilirubin 0.3 - 1.2 mg/dL - - 1.0  Alkaline Phos 38 - 126 U/L - - 78  AST 15 - 41 U/L - - 32  ALT 17 - 63 U/L - - 32   CBC Latest Ref Rng & Units 08/19/2019 06/26/2017 10/29/2015  WBC 3.4 - 10.8 x10E3/uL 5.7 8.5 9.0  Hemoglobin 13.0 - 17.7 g/dL 16.5 15.7 14.9  Hematocrit 37.5 - 51.0 % 48.9 44.6 43.5  Platelets 150 - 450 x10E3/uL 240 236 235     Lipid Panel     Component Value Date/Time   CHOL 131 06/15/2015 0651   TRIG 76 06/15/2015 0651   HDL 55 06/15/2015 0651   CHOLHDL 2.4 06/15/2015 0651   VLDL 15 06/15/2015 0651   LDLCALC 61 06/15/2015 0651    HEMOGLOBIN A1C Lab Results  Component Value Date   HGBA1C 8.0 (H) 01/04/2011   MPG 183 (H) 01/04/2011   TSH No results for input(s): TSH in the last 8760 hours.  External labs:  Cholesterol, total 143.000 01/18/2019 HDL 52.000 01/18/2019 LDL 32.000 01/18/2019 Triglycerides 293.000 01/18/2019  A1C 8.700 07/05/2019  Creatinine, Serum 0.970 01/18/2019 Potassium 4.400 01/18/2019 ALT (SGPT) 24.000 01/18/2019 TSH 1.930 01/18/2019  Hemoglobin 15.100 12/16/2017 INR 0.940 06/26/2017 Platelets 236.000 06/26/2017  12/16/2017: Total cholesterol 109, triglycerides 110, HDL 52, LDL 56.    TSH normal.  A1c 7.7%.  Serum glucose 174, BUN 15, creatinine 0.85, which eGFR greater than 60 ML, CMP otherwise normal.  CBC normal.  Medications and allergies   Allergies  Allergen Reactions  . Brilinta [Ticagrelor] Shortness Of Breath  . Statins     Muscle pain, fatigue      Current Outpatient Medications  Medication Instructions  . acetaminophen (TYLENOL) 650 mg, Oral, Every 6 hours PRN  . albuterol (VENTOLIN HFA) 108 (90 Base) MCG/ACT inhaler 1 puff, Inhalation, Every 6 hours PRN  . amLODipine (NORVASC) 5 MG tablet TAKE 1 TABLET BY MOUTH ONCE A DAY  . aspirin 81 mg, Oral, Daily  . clopidogrel (PLAVIX) 75 MG tablet TAKE 1 TABLET BY MOUTH ONCE DAILY  . guaiFENesin (MUCINEX) 600 mg, Oral, 2 times daily PRN  . insulin aspart (NOVOLOG) 100 UNIT/ML injection Subcutaneous, See admin instructions, Uses via Insulin Pump  . losartan (COZAAR) 25 MG tablet TAKE HALF A TABLET BY MOUTH DAILY  . MELATONIN PO 1 tablet, Oral, At bedtime PRN  . metoprolol tartrate (LOPRESSOR) 25 MG tablet TAKE 1/2 TABLET BY MOUTH TWICE A DAY AS NEEDED  . Multiple Vitamins-Minerals (CENTRUM SILVER 50+MEN PO) 1  tablet, Oral, Daily  . nitroGLYCERIN (NITROSTAT) 0.4 mg, Sublingual, Every 5 min PRN  . ranolazine (RANEXA) 500 mg, Oral, 2 times daily   Medications Discontinued During This Encounter  Medication Reason  . Alirocumab (PRALUENT) 150 MG/ML SOAJ Not covered by the pt's insurance    Radiology:   No results found.  Cardiac Studies:   Lexiscan Sestamibi stress test 09/04/2017:  1. Pharmacologic stress testing was performed with intravenous administration of .4 mg of Lexiscan over a 10-15 seconds infusion. Stress symptoms included dyspnea and abdominal pain. Normal blood pressure. Exercise capacity not assessed. Stress EKG is non diagnostic for ischemia as it is a pharmacologic stress.  2. The overall quality of the study is excellent. There is no evidence of abnormal lung activity. Stress and rest SPECT images demonstrate homogeneous tracer distribution throughout the myocardium. Gated SPECT imaging reveals normal myocardial thickening and wall motion. The left ventricular ejection fraction was normal (81%).   3. Low risk study.  Coronary angiogram 06/26/2017: Normal LV systolic function, no regional wall motion of a mallet he. Widely patent mid LAD stent and mid circumflex stent and balloon angioplasty site to the ramus intermediate. The mid to distal LAD has a focal 60-70% stenosis. Apical LAD has a ulcerated 95-99% stenosis.  Echocardiogram 07/20/2019:  Left ventricle cavity is normal in size. Mild concentric hypertrophy of the left ventricle. Normal LV systolic function with EF 65%. Normal global wall motion. Normal diastolic filling pattern. Calculated EF 65%.  Mild (Grade I) mitral regurgitation.  Mild tricuspid regurgitation. Estimated pulmonary artery systolic pressure is 26 mmHg.  No significant change compared to previous study on 08/27/2017.  72-hour Holter monitor 2/8-2/04/2020:  Normal sinus rhythm.  Minimum heart rate 66 bpm, max heart rate 113 bpm.  4.73% tachycardia burden.   Occasional PVC (1.32% burden) in the form of isolated, couplets, and in a bigeminal pattern.  Rare PAC.  No reported symptoms.  No A. fib or SVT was noted.  CPX 08/01/2019: Exercise testing with gas exchange demonstrates low-normal functional capacity when compared to matched sedentary norms. There are no clear indications for cardiopulmonary limitations. At peak exercise there was normal O2 pulse.  Low normal functional capacity. No evidence of HF limitation. At peak exercise, there was lateral ST depression suggestive of ischemia.   Left Heart Catheterization 08/30/19:    The left ventricular systolic function is normal.  LV end diastolic pressure is normal. LV: Normal LV systolic function.  Normal EDP. No change in coronary anatomy since 06/26/2017. Apical LAD ulcerated stenosis has completely healed. Small vessel disease and microvascular angina. Previous PTCA site widely patent: See below.  LAD stent placed on 06/15/2015 with 3 x 26 mm resolute stent. Ramus intermediate balloon angioplasty on 07/11/2014 and mid circumflex stent with a 4.0 x 15 mm integrity non-DES stent in 2012.  Recommendation: Patient's angina pectoris is related to small vessel disease and microvascular angina pectoris in view of uncontrolled diabetes mellitus.  I have reassured him.  50 mL contrast utilized.  EKG   08/11/2019: Normal sinus rhythm with rate of 78 bpm, normal axis.  Poor R wave progression, cannot exclude anteroseptal infarct old.  No evidence of ischemia.   ABNORMAL   07/18/2019: Normal sinus rhythm at 76 bpm, normal axis, no evidence of ischemia.   Assessment     ICD-10-CM   1. Coronary artery disease of native artery of native heart with stable angina pectoris ([place])  I25.118 AMB referral to cardiac rehabilitation  2. Microvascular angina (HCC)  I20.8 AMB referral to cardiac rehabilitation  3. Dyspnea on exertion  R06.00 AMB referral to cardiac rehabilitation  4. Uncontrolled type 2 diabetes  mellitus with hyperglycemia (HCC)  E11.65      Recommendations:   Travis Bamber.  is a 64 y.o. male  with known coronary artery disease, has mid LAD stent placed on 06/15/2015 with 3 x 26 mm resolute stent, balloon angioplasty to ramus intermediate on 07/11/2014 and mid circumflex stent with a 4.0 x 15 mm integrity non-DES stent in 2012. He presented on 06/24/2016 with symptoms of unstable angina pectoris, coronary angiography on 06/26/2017 revealed an ulcerated 95% stenosis of the apical LAD and about a 50-60% stenosis of the distal LAD and previously performed angioplasty sites were widely patent. Medical therapy was recommended. He has a history of hyperlipidemia with statin intolerance now on Praluent, uncontrolled type I diabetes follows Dr Dagmar Hait.  His symptoms of palpitations are related to frequent PVCs, he still continues to have anginal episodes but this is microvascular angina related to probably uncontrolled hypertension.  His blood pressure is well controlled, diabetes continues to be uncontrolled, advised him to increase his physical activity as tolerated.  He will greatly benefit from cardiac rehab.  He has been on Praluent however needs prior authorization and I will follow up on this.  See him back in 6 months for follow-up.  Adrian Prows, MD, [facility] 11/14/2019, 10:23 AM [facility] Pager: [phone] Office: [phone]

## 2019-11-14 ENCOUNTER — Ambulatory Visit: Payer: 59 | Admitting: Cardiology

## 2019-11-14 ENCOUNTER — Telehealth (HOSPITAL_COMMUNITY): Payer: Self-pay | Admitting: *Deleted

## 2019-11-14 ENCOUNTER — Other Ambulatory Visit: Payer: Self-pay

## 2019-11-14 ENCOUNTER — Encounter: Payer: Self-pay | Admitting: Cardiology

## 2019-11-14 VITALS — BP 131/76 | HR 79 | Resp 16 | Ht 69.0 in | Wt 204.0 lb

## 2019-11-14 DIAGNOSIS — R0609 Other forms of dyspnea: Secondary | ICD-10-CM

## 2019-11-14 DIAGNOSIS — E1165 Type 2 diabetes mellitus with hyperglycemia: Secondary | ICD-10-CM

## 2019-11-14 DIAGNOSIS — R0600 Dyspnea, unspecified: Secondary | ICD-10-CM

## 2019-11-14 DIAGNOSIS — I208 Other forms of angina pectoris: Secondary | ICD-10-CM

## 2019-11-14 DIAGNOSIS — I25118 Atherosclerotic heart disease of native coronary artery with other forms of angina pectoris: Secondary | ICD-10-CM

## 2019-11-14 NOTE — Telephone Encounter (Signed)
Received referral from Dr. Einar Gip for this patient to participate in cardiac rehab with the diagnosis of Stable angina/microvascular disease. Noted that pt lives in [place].  Called pt to determine his preference in location. Pt indicated he is half way from here and [facility].  Pt asked how soon he could get started.  Advised pt that presently we are scheduling for the second week of July and [facility] could schedule for next week.  Pt prefers to go ahead and proceed with scheduling for [facility].  Called and notified [facility] CR staff and routed the referral to their workque.  Pt thanked me for the call. Cherre Huger, BSN Cardiac and Training and development officer

## 2019-11-16 ENCOUNTER — Encounter: Payer: 59 | Attending: Cardiology

## 2019-11-16 ENCOUNTER — Other Ambulatory Visit: Payer: Self-pay

## 2019-11-16 DIAGNOSIS — Z7982 Long term (current) use of aspirin: Secondary | ICD-10-CM | POA: Insufficient documentation

## 2019-11-16 DIAGNOSIS — Z8546 Personal history of malignant neoplasm of prostate: Secondary | ICD-10-CM | POA: Insufficient documentation

## 2019-11-16 DIAGNOSIS — Z87442 Personal history of urinary calculi: Secondary | ICD-10-CM | POA: Insufficient documentation

## 2019-11-16 DIAGNOSIS — E1140 Type 2 diabetes mellitus with diabetic neuropathy, unspecified: Secondary | ICD-10-CM | POA: Insufficient documentation

## 2019-11-16 DIAGNOSIS — Z7902 Long term (current) use of antithrombotics/antiplatelets: Secondary | ICD-10-CM | POA: Insufficient documentation

## 2019-11-16 DIAGNOSIS — Z794 Long term (current) use of insulin: Secondary | ICD-10-CM | POA: Insufficient documentation

## 2019-11-16 DIAGNOSIS — M1990 Unspecified osteoarthritis, unspecified site: Secondary | ICD-10-CM | POA: Insufficient documentation

## 2019-11-16 DIAGNOSIS — Z79899 Other long term (current) drug therapy: Secondary | ICD-10-CM | POA: Insufficient documentation

## 2019-11-16 DIAGNOSIS — E7800 Pure hypercholesterolemia, unspecified: Secondary | ICD-10-CM | POA: Insufficient documentation

## 2019-11-16 DIAGNOSIS — I25118 Atherosclerotic heart disease of native coronary artery with other forms of angina pectoris: Secondary | ICD-10-CM | POA: Insufficient documentation

## 2019-11-16 DIAGNOSIS — Z9641 Presence of insulin pump (external) (internal): Secondary | ICD-10-CM | POA: Insufficient documentation

## 2019-11-16 NOTE — Progress Notes (Signed)
Virtual Visit completed. Patient informed on EP and RD appointment and 6 Minute walk test. Patient also informed of patient health questionnaires on My Chart. Patient Verbalizes understanding. Visit diagnosis can be found in [facility] 11/14/2019. °

## 2019-11-21 ENCOUNTER — Other Ambulatory Visit: Payer: Self-pay

## 2019-11-21 ENCOUNTER — Encounter: Payer: 59 | Admitting: *Deleted

## 2019-11-21 VITALS — Ht 70.5 in | Wt 204.4 lb

## 2019-11-21 DIAGNOSIS — Z87442 Personal history of urinary calculi: Secondary | ICD-10-CM | POA: Diagnosis not present

## 2019-11-21 DIAGNOSIS — I25118 Atherosclerotic heart disease of native coronary artery with other forms of angina pectoris: Secondary | ICD-10-CM

## 2019-11-21 DIAGNOSIS — Z794 Long term (current) use of insulin: Secondary | ICD-10-CM | POA: Diagnosis not present

## 2019-11-21 DIAGNOSIS — Z7902 Long term (current) use of antithrombotics/antiplatelets: Secondary | ICD-10-CM | POA: Diagnosis not present

## 2019-11-21 DIAGNOSIS — Z8546 Personal history of malignant neoplasm of prostate: Secondary | ICD-10-CM | POA: Diagnosis not present

## 2019-11-21 DIAGNOSIS — E7800 Pure hypercholesterolemia, unspecified: Secondary | ICD-10-CM | POA: Diagnosis not present

## 2019-11-21 DIAGNOSIS — M1990 Unspecified osteoarthritis, unspecified site: Secondary | ICD-10-CM | POA: Diagnosis not present

## 2019-11-21 DIAGNOSIS — Z79899 Other long term (current) drug therapy: Secondary | ICD-10-CM | POA: Diagnosis not present

## 2019-11-21 DIAGNOSIS — E1140 Type 2 diabetes mellitus with diabetic neuropathy, unspecified: Secondary | ICD-10-CM | POA: Diagnosis not present

## 2019-11-21 DIAGNOSIS — Z7982 Long term (current) use of aspirin: Secondary | ICD-10-CM | POA: Diagnosis not present

## 2019-11-21 DIAGNOSIS — Z9641 Presence of insulin pump (external) (internal): Secondary | ICD-10-CM | POA: Diagnosis not present

## 2019-11-21 NOTE — Patient Instructions (Signed)
Patient Instructions  Patient Details  Name: Travis Fletcher. MRN: [id number] Date of Birth: 12/10/1955 Referring Provider:  Adrian Prows, MD  Below are your personal goals for exercise, nutrition, and risk factors. Our goal is to help you stay on track towards obtaining and maintaining these goals. We will be discussing your progress on these goals with you throughout the program.  Initial Exercise Prescription:  Initial Exercise Prescription - 11/21/19 0900      Date of Initial Exercise RX and Referring Provider   Date 11/21/19    Referring Provider Adrian Prows MD      Treadmill   MPH 2.2    Grade 1.5    Minutes 15    METs 3.14      NuStep   Level 3    SPM 80    Minutes 15    METs 3      REL-XR   Level 3    Speed 50    Minutes 15    METs 3      Biostep-RELP   Level 3    SPM 50    Minutes 15    METs 3      Prescription Details   Frequency (times per week) 3    Duration Progress to 30 minutes of continuous aerobic without signs/symptoms of physical distress      Intensity   THRR 40-80% of Max Heartrate 112-143    Ratings of Perceived Exertion 11-13    Perceived Dyspnea 0-4      Progression   Progression Continue to progress workloads to maintain intensity without signs/symptoms of physical distress.      Resistance Training   Training Prescription Yes    Weight 4 lb    Reps 10-15           Exercise Goals: Frequency: Be able to perform aerobic exercise two to three times per week in program working toward 2-5 days per week of home exercise.  Intensity: Work with a perceived exertion of 11 (fairly light) - 15 (hard) while following your exercise prescription.  We will make changes to your prescription with you as you progress through the program.   Duration: Be able to do 30 to 45 minutes of continuous aerobic exercise in addition to a 5 minute warm-up and a 5 minute cool-down routine.   Nutrition Goals: Your personal nutrition goals will be  established when you do your nutrition analysis with the dietician.  The following are general nutrition guidelines to follow: Cholesterol < 200mg /day Sodium < 1500mg /day Fiber: Men over 50 yrs - 30 grams per day  Personal Goals:  Personal Goals and Risk Factors at Admission - 11/21/19 0922      Core Components/Risk Factors/Patient Goals on Admission    Weight Management Yes;Weight Loss    Intervention Weight Management: Develop a combined nutrition and exercise program designed to reach desired caloric intake, while maintaining appropriate intake of nutrient and fiber, sodium and fats, and appropriate energy expenditure required for the weight goal.;Weight Management: Provide education and appropriate resources to help participant work on and attain dietary goals.    Admit Weight 204 lb 6.4 oz (92.7 kg)    Goal Weight: Short Term 199 lb (90.3 kg)    Goal Weight: Long Term 194 lb (88 kg)    Expected Outcomes Short Term: Continue to assess and modify interventions until short term weight is achieved;Long Term: Adherence to nutrition and physical activity/exercise program aimed toward attainment of established weight goal;Weight  Loss: Understanding of general recommendations for a balanced deficit meal plan, which promotes 1-2 lb weight loss per week and includes a negative energy balance of [phone] kcal/d;Understanding recommendations for meals to include 15-35% energy as protein, 25-35% energy from fat, 35-60% energy from carbohydrates, less than 200mg  of dietary cholesterol, 20-35 gm of total fiber daily;Understanding of distribution of calorie intake throughout the day with the consumption of 4-5 meals/snacks    Improve shortness of breath with ADL's Yes    Intervention Provide education, individualized exercise plan and daily activity instruction to help decrease symptoms of SOB with activities of daily living.    Expected Outcomes Short Term: Improve cardiorespiratory fitness to achieve a  reduction of symptoms when performing ADLs;Long Term: Be able to perform more ADLs without symptoms or delay the onset of symptoms    Diabetes Yes    Intervention Provide education about signs/symptoms and action to take for hypo/hyperglycemia.;Provide education about proper nutrition, including hydration, and aerobic/resistive exercise prescription along with prescribed medications to achieve blood glucose in normal ranges: Fasting glucose 65-99 mg/dL    Expected Outcomes Short Term: Participant verbalizes understanding of the signs/symptoms and immediate care of hyper/hypoglycemia, proper foot care and importance of medication, aerobic/resistive exercise and nutrition plan for blood glucose control.;Long Term: Attainment of HbA1C < 7%.    Lipids Yes    Intervention Provide education and support for participant on nutrition & aerobic/resistive exercise along with prescribed medications to achieve LDL 70mg , HDL >40mg .    Expected Outcomes Short Term: Participant states understanding of desired cholesterol values and is compliant with medications prescribed. Participant is following exercise prescription and nutrition guidelines.;Long Term: Cholesterol controlled with medications as prescribed, with individualized exercise RX and with personalized nutrition plan. Value goals: LDL < 70mg , HDL > 40 mg.           Tobacco Use Initial Evaluation: Social History   Tobacco Use  Smoking Status Never Smoker  Smokeless Tobacco Never Used    Exercise Goals and Review:  Exercise Goals    Row Name 11/21/19 0919             Exercise Goals   Increase Physical Activity Yes       Intervention Provide advice, education, support and counseling about physical activity/exercise needs.;Develop an individualized exercise prescription for aerobic and resistive training based on initial evaluation findings, risk stratification, comorbidities and participant's personal goals.       Expected Outcomes Short Term:  Attend rehab on a regular basis to increase amount of physical activity.;Long Term: Add in home exercise to make exercise part of routine and to increase amount of physical activity.;Long Term: Exercising regularly at least 3-5 days a week.       Increase Strength and Stamina Yes       Intervention Provide advice, education, support and counseling about physical activity/exercise needs.;Develop an individualized exercise prescription for aerobic and resistive training based on initial evaluation findings, risk stratification, comorbidities and participant's personal goals.       Expected Outcomes Short Term: Increase workloads from initial exercise prescription for resistance, speed, and METs.;Short Term: Perform resistance training exercises routinely during rehab and add in resistance training at home;Long Term: Improve cardiorespiratory fitness, muscular endurance and strength as measured by increased METs and functional capacity (6MWT)       Able to understand and use rate of perceived exertion (RPE) scale Yes       Intervention Provide education and explanation on how to use RPE scale  Expected Outcomes Short Term: Able to use RPE daily in rehab to express subjective intensity level;Long Term:  Able to use RPE to guide intensity level when exercising independently       Able to understand and use Dyspnea scale Yes       Intervention Provide education and explanation on how to use Dyspnea scale       Expected Outcomes Long Term: Able to use Dyspnea scale to guide intensity level when exercising independently;Short Term: Able to use Dyspnea scale daily in rehab to express subjective sense of shortness of breath during exertion       Knowledge and understanding of Target Heart Rate Range (THRR) Yes       Intervention Provide education and explanation of THRR including how the numbers were predicted and where they are located for reference       Expected Outcomes Short Term: Able to state/look up  THRR;Short Term: Able to use daily as guideline for intensity in rehab;Long Term: Able to use THRR to govern intensity when exercising independently       Able to check pulse independently Yes       Intervention Provide education and demonstration on how to check pulse in carotid and radial arteries.;Review the importance of being able to check your own pulse for safety during independent exercise       Expected Outcomes Short Term: Able to explain why pulse checking is important during independent exercise;Long Term: Able to check pulse independently and accurately       Understanding of Exercise Prescription Yes       Intervention Provide education, explanation, and written materials on patient's individual exercise prescription       Expected Outcomes Short Term: Able to explain program exercise prescription;Long Term: Able to explain home exercise prescription to exercise independently              Copy of goals given to participant.

## 2019-11-21 NOTE — Progress Notes (Signed)
Cardiac Individual Treatment Plan  Patient Details  Name: Travis Fletcher. MRN: [id number] Date of Birth: [birth year] Referring Provider:     Cardiac Rehab from 11/21/2019 in [facility] Cardiac and Pulmonary Rehab  Referring Provider Adrian Prows MD      Initial Encounter Date:    Cardiac Rehab from 11/21/2019 in [facility] Cardiac and Pulmonary Rehab  Date 11/21/19      Visit Diagnosis: Coronary artery disease of native artery of native heart with stable angina pectoris ([place])  Patient's Home Medications on Admission:  Current Outpatient Medications:  .  acetaminophen (TYLENOL) 325 MG tablet, Take 650 mg by mouth every 6 (six) hours as needed for moderate pain or headache., Disp: , Rfl:  .  albuterol (VENTOLIN HFA) 108 (90 Base) MCG/ACT inhaler, Inhale 1 puff into the lungs every 6 (six) hours as needed for wheezing or shortness of breath. , Disp: , Rfl:  .  amLODipine (NORVASC) 5 MG tablet, TAKE 1 TABLET BY MOUTH ONCE A DAY, Disp: 30 tablet, Rfl: 6 .  aspirin 81 MG tablet, Take 1 tablet (81 mg total) by mouth daily., Disp: , Rfl:  .  clopidogrel (PLAVIX) 75 MG tablet, TAKE 1 TABLET BY MOUTH ONCE DAILY (Patient taking differently: Take 75 mg by mouth daily. ), Disp: 30 tablet, Rfl: 3 .  guaiFENesin (MUCINEX) 600 MG 12 hr tablet, Take 600 mg by mouth 2 (two) times daily as needed (congestion)., Disp: , Rfl:  .  insulin aspart (NOVOLOG) 100 UNIT/ML injection, Inject into the skin See admin instructions. Uses via Insulin Pump, Disp: , Rfl:  .  losartan (COZAAR) 25 MG tablet, TAKE HALF A TABLET BY MOUTH DAILY (Patient taking differently: Take 12.5 mg by mouth daily. ), Disp: 90 tablet, Rfl: 0 .  MELATONIN PO, Take 1 tablet by mouth at bedtime as needed (sleep)., Disp: , Rfl:  .  metoprolol tartrate (LOPRESSOR) 25 MG tablet, TAKE 1/2 TABLET BY MOUTH TWICE A DAY AS NEEDED (Patient taking differently: Take 12.5 mg by mouth 2 (two) times daily. ), Disp: 180 tablet, Rfl: 0 .  Multiple  Vitamins-Minerals (CENTRUM SILVER 50+MEN PO), Take 1 tablet by mouth daily., Disp: , Rfl:  .  nitroGLYCERIN (NITROSTAT) 0.4 MG SL tablet, Place 1 tablet (0.4 mg total) under the tongue every 5 (five) minutes as needed for chest pain., Disp: 30 tablet, Rfl: 6 .  ranolazine (RANEXA) 500 MG 12 hr tablet, Take 1 tablet (500 mg total) by mouth 2 (two) times daily., Disp: 60 tablet, Rfl: 1  Past Medical History: Past Medical History:  Diagnosis Date  . Anginal pain ([place])   . Arthritis    "hands" (07/11/2014)  . CAD (coronary artery disease), native coronary artery 07/24/2018  . Coronary artery disease   . High cholesterol   . Hypercholesteremia 07/24/2018  . Insulin dependent diabetes mellitus   . Kidney stones    "passed them all"  . Neuromuscular disorder (HCC)    neuropathy  . Prostate cancer ([place]) 2010  . Statin intolerance 07/24/2018  . Uncontrolled diabetes mellitus ([place]) 07/24/2018    Tobacco Use: Social History   Tobacco Use  Smoking Status Never Smoker  Smokeless Tobacco Never Used    Labs: Recent Review Flowsheet Data    Labs for ITP Cardiac and Pulmonary Rehab Latest Ref Rng & Units 02/25/2008 01/04/2011 01/05/2011 06/15/2015   Cholestrol 0 - 200 mg/dL - - 161 131   LDLCALC 0 - 99 mg/dL - - 87 61   HDL >40 mg/dL - -  51 55   Trlycerides <150 mg/dL - - 114 76   Hemoglobin A1c <5.7 % - 8.0(H) - -   TCO2 - 28 - - -       Exercise Target Goals: Exercise Program Goal: Individual exercise prescription set using results from initial 6 min walk test and THRR while considering  patient's activity barriers and safety.   Exercise Prescription Goal: Initial exercise prescription builds to 30-45 minutes a day of aerobic activity, 2-3 days per week.  Home exercise guidelines will be given to patient during program as part of exercise prescription that the participant will acknowledge.   Education: Aerobic Exercise & Resistance Training: - Gives group verbal and written instruction on  the various components of exercise. Focuses on aerobic and resistive training programs and the benefits of this training and how to safely progress through these programs..   Education: Exercise & Equipment Safety: - Individual verbal instruction and demonstration of equipment use and safety with use of the equipment.   Cardiac Rehab from 11/16/2019 in [facility] Cardiac and Pulmonary Rehab  Date 11/16/19  Educator [facility]  Instruction Review Code 1- Verbalizes Understanding      Education: Exercise Physiology & General Exercise Guidelines: - Group verbal and written instruction with models to review the exercise physiology of the cardiovascular system and associated critical values. Provides general exercise guidelines with specific guidelines to those with heart or lung disease.    Education: Flexibility, Balance, Mind/Body Relaxation: Provides group verbal/written instruction on the benefits of flexibility and balance training, including mind/body exercise modes such as yoga, pilates and tai chi.  Demonstration and skill practice provided.   Activity Barriers & Risk Stratification:  Activity Barriers & Cardiac Risk Stratification - 11/21/19 0916      Activity Barriers & Cardiac Risk Stratification   Activity Barriers Joint Problems;Deconditioning;Muscular Weakness;Chest Pain/Angina   shoulder problems   Cardiac Risk Stratification Moderate           6 Minute Walk:  6 Minute Walk    Row Name 11/21/19 0916         6 Minute Walk   Phase Initial     Distance 1210 feet     Walk Time 6 minutes     # of Rest Breaks 0     MPH 2.29     METS 3.23     RPE 7     VO2 Peak 11.28     Symptoms Yes (comment)     Comments angina 2/10     Resting HR 80 bpm     Resting BP 122/70     Resting Oxygen Saturation  97 %     Exercise Oxygen Saturation  during 6 min walk 96 %     Max Ex. HR 105 bpm     Max Ex. BP 128/64     2 Minute Post BP 122/74            Oxygen Initial  Assessment:   Oxygen Re-Evaluation:   Oxygen Discharge (Final Oxygen Re-Evaluation):   Initial Exercise Prescription:  Initial Exercise Prescription - 11/21/19 0900      Date of Initial Exercise RX and Referring Provider   Date 11/21/19    Referring Provider Adrian Prows MD      Treadmill   MPH 2.2    Grade 1.5    Minutes 15    METs 3.14      NuStep   Level 3    SPM 80    Minutes  15    METs 3      REL-XR   Level 3    Speed 50    Minutes 15    METs 3      Biostep-RELP   Level 3    SPM 50    Minutes 15    METs 3      Prescription Details   Frequency (times per week) 3    Duration Progress to 30 minutes of continuous aerobic without signs/symptoms of physical distress      Intensity   THRR 40-80% of Max Heartrate 112-143    Ratings of Perceived Exertion 11-13    Perceived Dyspnea 0-4      Progression   Progression Continue to progress workloads to maintain intensity without signs/symptoms of physical distress.      Resistance Training   Training Prescription Yes    Weight 4 lb    Reps 10-15           Perform Capillary Blood Glucose checks as needed.  Exercise Prescription Changes:  Exercise Prescription Changes    Row Name 11/21/19 0900             Response to Exercise   Blood Pressure (Admit) 122/70       Blood Pressure (Exercise) 128/64       Blood Pressure (Exit) 122/74       Heart Rate (Admit) 80 bpm       Heart Rate (Exercise) 105 bpm       Heart Rate (Exit) 77 bpm       Oxygen Saturation (Admit) 97 %       Oxygen Saturation (Exercise) 96 %       Rating of Perceived Exertion (Exercise) 7       Symptoms chest pain 2/10       Comments walk test results              Exercise Comments:   Exercise Goals and Review:  Exercise Goals    Row Name 11/21/19 0919             Exercise Goals   Increase Physical Activity Yes       Intervention Provide advice, education, support and counseling about physical activity/exercise  needs.;Develop an individualized exercise prescription for aerobic and resistive training based on initial evaluation findings, risk stratification, comorbidities and participant's personal goals.       Expected Outcomes Short Term: Attend rehab on a regular basis to increase amount of physical activity.;Long Term: Add in home exercise to make exercise part of routine and to increase amount of physical activity.;Long Term: Exercising regularly at least 3-5 days a week.       Increase Strength and Stamina Yes       Intervention Provide advice, education, support and counseling about physical activity/exercise needs.;Develop an individualized exercise prescription for aerobic and resistive training based on initial evaluation findings, risk stratification, comorbidities and participant's personal goals.       Expected Outcomes Short Term: Increase workloads from initial exercise prescription for resistance, speed, and METs.;Short Term: Perform resistance training exercises routinely during rehab and add in resistance training at home;Long Term: Improve cardiorespiratory fitness, muscular endurance and strength as measured by increased METs and functional capacity (6MWT)       Able to understand and use rate of perceived exertion (RPE) scale Yes       Intervention Provide education and explanation on how to use RPE scale  Expected Outcomes Short Term: Able to use RPE daily in rehab to express subjective intensity level;Long Term:  Able to use RPE to guide intensity level when exercising independently       Able to understand and use Dyspnea scale Yes       Intervention Provide education and explanation on how to use Dyspnea scale       Expected Outcomes Long Term: Able to use Dyspnea scale to guide intensity level when exercising independently;Short Term: Able to use Dyspnea scale daily in rehab to express subjective sense of shortness of breath during exertion       Knowledge and understanding of  Target Heart Rate Range (THRR) Yes       Intervention Provide education and explanation of THRR including how the numbers were predicted and where they are located for reference       Expected Outcomes Short Term: Able to state/look up THRR;Short Term: Able to use daily as guideline for intensity in rehab;Long Term: Able to use THRR to govern intensity when exercising independently       Able to check pulse independently Yes       Intervention Provide education and demonstration on how to check pulse in carotid and radial arteries.;Review the importance of being able to check your own pulse for safety during independent exercise       Expected Outcomes Short Term: Able to explain why pulse checking is important during independent exercise;Long Term: Able to check pulse independently and accurately       Understanding of Exercise Prescription Yes       Intervention Provide education, explanation, and written materials on patient's individual exercise prescription       Expected Outcomes Short Term: Able to explain program exercise prescription;Long Term: Able to explain home exercise prescription to exercise independently              Exercise Goals Re-Evaluation :   Discharge Exercise Prescription (Final Exercise Prescription Changes):  Exercise Prescription Changes - 11/21/19 0900      Response to Exercise   Blood Pressure (Admit) 122/70    Blood Pressure (Exercise) 128/64    Blood Pressure (Exit) 122/74    Heart Rate (Admit) 80 bpm    Heart Rate (Exercise) 105 bpm    Heart Rate (Exit) 77 bpm    Oxygen Saturation (Admit) 97 %    Oxygen Saturation (Exercise) 96 %    Rating of Perceived Exertion (Exercise) 7    Symptoms chest pain 2/10    Comments walk test results           Nutrition:  Target Goals: Understanding of nutrition guidelines, daily intake of sodium <1546m, cholesterol <2065m calories 30% from fat and 7% or less from saturated fats, daily to have 5 or more servings  of fruits and vegetables.  Education: Controlling Sodium/Reading Food Labels -Group verbal and written material supporting the discussion of sodium use in heart healthy nutrition. Review and explanation with models, verbal and written materials for utilization of the food label.   Education: General Nutrition Guidelines/Fats and Fiber: -Group instruction provided by verbal, written material, models and posters to present the general guidelines for heart healthy nutrition. Gives an explanation and review of dietary fats and fiber.   Biometrics:  Pre Biometrics - 11/21/19 0920      Pre Biometrics   Height 5' 10.5" (1.791 m)    Weight 204 lb 6.4 oz (92.7 kg)    BMI (Calculated) 28.9  Single Leg Stand 30 seconds            Nutrition Therapy Plan and Nutrition Goals:   Nutrition Assessments:  Nutrition Assessments - 11/21/19 0921      MEDFICTS Scores   Pre Score 55           MEDIFICTS Score Key:          ?70 Need to make dietary changes          40-70 Heart Healthy Diet         ? 40 Therapeutic Level Cholesterol Diet  Nutrition Goals Re-Evaluation:   Nutrition Goals Discharge (Final Nutrition Goals Re-Evaluation):   Psychosocial: Target Goals: Acknowledge presence or absence of significant depression and/or stress, maximize coping skills, provide positive support system. Participant is able to verbalize types and ability to use techniques and skills needed for reducing stress and depression.   Education: Depression - Provides group verbal and written instruction on the correlation between heart/lung disease and depressed mood, treatment options, and the stigmas associated with seeking treatment.   Education: Sleep Hygiene -Provides group verbal and written instruction about how sleep can affect your health.  Define sleep hygiene, discuss sleep cycles and impact of sleep habits. Review good sleep hygiene tips.     Education: Stress and Anxiety: - Provides  group verbal and written instruction about the health risks of elevated stress and causes of high stress.  Discuss the correlation between heart/lung disease and anxiety and treatment options. Review healthy ways to manage with stress and anxiety.    Initial Review & Psychosocial Screening:  Initial Psych Review & Screening - 11/16/19 1338      Initial Review   Current issues with Current Stress Concerns    Source of Stress Concerns Occupation;Chronic Illness    Comments [name] has not been able to work since December due to his health and is making him stressed.      Family Dynamics   Good Support System? Yes    Comments He can look to his wife, son, daughter and sisters for support.      Barriers   Psychosocial barriers to participate in program There are no identifiable barriers or psychosocial needs.;The patient should benefit from training in stress management and relaxation.      Screening Interventions   Interventions Encouraged to exercise;To provide support and resources with identified psychosocial needs;Provide feedback about the scores to participant    Expected Outcomes Short Term goal: Utilizing psychosocial counselor, staff and physician to assist with identification of specific Stressors or current issues interfering with healing process. Setting desired goal for each stressor or current issue identified.;Long Term Goal: Stressors or current issues are controlled or eliminated.;Short Term goal: Identification and review with participant of any Quality of Life or Depression concerns found by scoring the questionnaire.;Long Term goal: The participant improves quality of Life and PHQ9 Scores as seen by post scores and/or verbalization of changes           Quality of Life Scores:   Quality of Life - 11/21/19 0921      Quality of Life   Select Quality of Life      Quality of Life Scores   Health/Function Pre 12.47 %    Socioeconomic Pre 20 %    Psych/Spiritual Pre 19.71  %    Family Pre 29.5 %    GLOBAL Pre 18.07 %          Scores of 19 and below usually indicate  a poorer quality of life in these areas.  A difference of  2-3 points is a clinically meaningful difference.  A difference of 2-3 points in the total score of the Quality of Life Index has been associated with significant improvement in overall quality of life, self-image, physical symptoms, and general health in studies assessing change in quality of life.  PHQ-9: Recent Review Flowsheet Data    Depression screen [facility] 2/9 11/21/2019   Decreased Interest 1   Down, Depressed, Hopeless 0   PHQ - 2 Score 1   Altered sleeping 0   Tired, decreased energy 2   Change in appetite 0   Feeling bad or failure about yourself  0   Trouble concentrating 0   Moving slowly or fidgety/restless 1   Suicidal thoughts 0   PHQ-9 Score 4   Difficult doing work/chores Somewhat difficult     Interpretation of Total Score  Total Score Depression Severity:  1-4 = Minimal depression, 5-9 = Mild depression, 10-14 = Moderate depression, 15-19 = Moderately severe depression, 20-27 = Severe depression   Psychosocial Evaluation and Intervention:  Psychosocial Evaluation - 11/16/19 1353      Psychosocial Evaluation & Interventions   Interventions Encouraged to exercise with the program and follow exercise prescription    Comments [name] has not been able to work since December due to his health and is making him stressed.    Expected Outcomes Short: Continue to exercise regularly to support mental health and notify staff of any changes. Long: maintain mental health and well being through teaching of rehab or prescribed medications independently.    Continue Psychosocial Services  Follow up required by staff           Psychosocial Re-Evaluation:   Psychosocial Discharge (Final Psychosocial Re-Evaluation):   Vocational Rehabilitation: Provide vocational rehab assistance to qualifying candidates.   Vocational  Rehab Evaluation & Intervention:   Education: Education Goals: Education classes will be provided on a variety of topics geared toward better understanding of heart health and risk factor modification. Participant will state understanding/return demonstration of topics presented as noted by education test scores.  Learning Barriers/Preferences:  Learning Barriers/Preferences - 11/16/19 1340      Learning Barriers/Preferences   Learning Barriers None    Learning Preferences None           General Cardiac Education Topics:  AED/CPR: - Group verbal and written instruction with the use of models to demonstrate the basic use of the AED with the basic ABC's of resuscitation.   Anatomy & Physiology of the Heart: - Group verbal and written instruction and models provide basic cardiac anatomy and physiology, with the coronary electrical and arterial systems. Review of Valvular disease and Heart Failure   Cardiac Procedures: - Group verbal and written instruction to review commonly prescribed medications for heart disease. Reviews the medication, class of the drug, and side effects. Includes the steps to properly store meds and maintain the prescription regimen. (beta blockers and nitrates)   Cardiac Medications I: - Group verbal and written instruction to review commonly prescribed medications for heart disease. Reviews the medication, class of the drug, and side effects. Includes the steps to properly store meds and maintain the prescription regimen.   Cardiac Medications II: -Group verbal and written instruction to review commonly prescribed medications for heart disease. Reviews the medication, class of the drug, and side effects. (all other drug classes)    Go Sex-Intimacy & Heart Disease, Get SMART - Goal Setting: -  Group verbal and written instruction through game format to discuss heart disease and the return to sexual intimacy. Provides group verbal and written material to  discuss and apply goal setting through the application of the S.M.A.R.T. Method.   Other Matters of the Heart: - Provides group verbal, written materials and models to describe Stable Angina and Peripheral Artery. Includes description of the disease process and treatment options available to the cardiac patient.   Infection Prevention: - Provides verbal and written material to individual with discussion of infection control including proper hand washing and proper equipment cleaning during exercise session.   Cardiac Rehab from 11/16/2019 in [facility] Cardiac and Pulmonary Rehab  Date 11/16/19  Educator [facility]  Instruction Review Code 1- Verbalizes Understanding      Falls Prevention: - Provides verbal and written material to individual with discussion of falls prevention and safety.   Cardiac Rehab from 11/16/2019 in [facility] Cardiac and Pulmonary Rehab  Date 11/16/19  Educator [facility]  Instruction Review Code 1- Verbalizes Understanding      Other: -Provides group and verbal instruction on various topics (see comments)   Knowledge Questionnaire Score:  Knowledge Questionnaire Score - 11/21/19 0921      Knowledge Questionnaire Score   Pre Score 25/26 Education Focus: Nutrition           Core Components/Risk Factors/Patient Goals at Admission:  Personal Goals and Risk Factors at Admission - 11/21/19 0922      Core Components/Risk Factors/Patient Goals on Admission    Weight Management Yes;Weight Loss    Intervention Weight Management: Develop a combined nutrition and exercise program designed to reach desired caloric intake, while maintaining appropriate intake of nutrient and fiber, sodium and fats, and appropriate energy expenditure required for the weight goal.;Weight Management: Provide education and appropriate resources to help participant work on and attain dietary goals.    Admit Weight 204 lb 6.4 oz (92.7 kg)    Goal Weight: Short Term 199 lb (90.3 kg)    Goal Weight: Long Term 194  lb (88 kg)    Expected Outcomes Short Term: Continue to assess and modify interventions until short term weight is achieved;Long Term: Adherence to nutrition and physical activity/exercise program aimed toward attainment of established weight goal;Weight Loss: Understanding of general recommendations for a balanced deficit meal plan, which promotes 1-2 lb weight loss per week and includes a negative energy balance of [id number] kcal/d;Understanding recommendations for meals to include 15-35% energy as protein, 25-35% energy from fat, 35-60% energy from carbohydrates, less than '200mg'$  of dietary cholesterol, 20-35 gm of total fiber daily;Understanding of distribution of calorie intake throughout the day with the consumption of 4-5 meals/snacks    Improve shortness of breath with ADL's Yes    Intervention Provide education, individualized exercise plan and daily activity instruction to help decrease symptoms of SOB with activities of daily living.    Expected Outcomes Short Term: Improve cardiorespiratory fitness to achieve a reduction of symptoms when performing ADLs;Long Term: Be able to perform more ADLs without symptoms or delay the onset of symptoms    Diabetes Yes    Intervention Provide education about signs/symptoms and action to take for hypo/hyperglycemia.;Provide education about proper nutrition, including hydration, and aerobic/resistive exercise prescription along with prescribed medications to achieve blood glucose in normal ranges: Fasting glucose 65-99 mg/dL    Expected Outcomes Short Term: Participant verbalizes understanding of the signs/symptoms and immediate care of hyper/hypoglycemia, proper foot care and importance of medication, aerobic/resistive exercise and nutrition plan for blood  glucose control.;Long Term: Attainment of HbA1C < 7%.    Lipids Yes    Intervention Provide education and support for participant on nutrition & aerobic/resistive exercise along with prescribed medications to  achieve LDL '70mg'$ , HDL >'40mg'$ .    Expected Outcomes Short Term: Participant states understanding of desired cholesterol values and is compliant with medications prescribed. Participant is following exercise prescription and nutrition guidelines.;Long Term: Cholesterol controlled with medications as prescribed, with individualized exercise RX and with personalized nutrition plan. Value goals: LDL < '70mg'$ , HDL > 40 mg.           Education:Diabetes - Individual verbal and written instruction to review signs/symptoms of diabetes, desired ranges of glucose level fasting, after meals and with exercise. Acknowledge that pre and post exercise glucose checks will be done for 3 sessions at entry of program.   Cardiac Rehab from 11/16/2019 in [facility] Cardiac and Pulmonary Rehab  Date 11/16/19  Educator [facility]  Instruction Review Code 1- Verbalizes Understanding      Education: Know Your Numbers and Risk Factors: -Group verbal and written instruction about important numbers in your health.  Discussion of what are risk factors and how they play a role in the disease process.  Review of Cholesterol, Blood Pressure, Diabetes, and BMI and the role they play in your overall health.   Core Components/Risk Factors/Patient Goals Review:    Core Components/Risk Factors/Patient Goals at Discharge (Final Review):    ITP Comments:  ITP Comments    Row Name 11/16/19 1352 11/21/19 0915         ITP Comments Virtual Visit completed. Patient informed on EP and RD appointment and 6 Minute walk test. Patient also informed of patient health questionnaires on My Chart. Patient Verbalizes understanding. Visit diagnosis can be found in [facility] 11/14/2019. Completed 6MWT and gym orientation.  Initial ITP created and sent for review to Dr. Emily Filbert, Medical Director.             Comments: Initial ITP

## 2019-11-23 ENCOUNTER — Other Ambulatory Visit: Payer: Self-pay

## 2019-11-23 ENCOUNTER — Encounter: Payer: 59 | Admitting: *Deleted

## 2019-11-23 ENCOUNTER — Encounter: Payer: Self-pay | Admitting: *Deleted

## 2019-11-23 DIAGNOSIS — I25118 Atherosclerotic heart disease of native coronary artery with other forms of angina pectoris: Secondary | ICD-10-CM

## 2019-11-23 LAB — GLUCOSE, CAPILLARY
Glucose-Capillary: 268 mg/dL — ABNORMAL HIGH (ref 70–99)
Glucose-Capillary: 310 mg/dL — ABNORMAL HIGH (ref 70–99)

## 2019-11-23 NOTE — Progress Notes (Signed)
Daily Session Note  Patient Details  Name: Travis Fletcher. MRN: [id number] Date of Birth: 1956/01/23 Referring Provider:     Cardiac Rehab from 11/21/2019 in [facility]  Referring Provider Adrian Prows MD      Encounter Date: 11/23/2019  Check In:  Session Check In - 11/23/19 1308      Check-In   Supervising physician immediately available to respond to emergencies See telemetry face sheet for immediately available ER MD    Location [facility]iac [facility]    Staff Present Renita Papa, RN BSN;Melissa Caiola RDN, Rowe Pavy, BA, ACSM CEP, Exercise Physiologist    Virtual Visit No    Medication changes reported     No    Fall or balance concerns reported    No    Warm-up and Cool-down Performed on first and last piece of equipment    Resistance Training Performed Yes    VAD Patient? No    PAD/SET Patient? No      Pain Assessment   Currently in Pain? No/denies              Social History   Tobacco Use  Smoking Status Never Smoker  Smokeless Tobacco Never Used    Goals Met:  Independence with exercise equipment Exercise tolerated well No report of cardiac concerns or symptoms Strength training completed today  Goals Unmet:  Not Applicable  Comments: First full day of exercise!  Patient was oriented to gym and equipment including functions, settings, policies, and procedures.  Patient's individual exercise prescription and treatment plan were reviewed.  All starting workloads were established based on the results of the 6 minute walk test done at initial orientation visit.  The plan for exercise progression was also introduced and progression will be customized based on patient's performance and goals.    Dr. Emily Filbert is Medical Director for [facility].

## 2019-11-23 NOTE — Progress Notes (Signed)
Cardiac Individual Treatment Plan  Patient Details  Name: Travis Fletcher. MRN: [id number] Date of Birth: 1955-08-06 Referring Provider:     Cardiac Rehab from 11/21/2019 in [facility] Cardiac and Pulmonary Rehab  Referring Provider Adrian Prows MD      Initial Encounter Date:    Cardiac Rehab from 11/21/2019 in [facility] Cardiac and Pulmonary Rehab  Date 11/21/19      Visit Diagnosis: Coronary artery disease of native artery of native heart with stable angina pectoris ([place])  Patient's Home Medications on Admission:  Current Outpatient Medications:  .  acetaminophen (TYLENOL) 325 MG tablet, Take 650 mg by mouth every 6 (six) hours as needed for moderate pain or headache., Disp: , Rfl:  .  albuterol (VENTOLIN HFA) 108 (90 Base) MCG/ACT inhaler, Inhale 1 puff into the lungs every 6 (six) hours as needed for wheezing or shortness of breath. , Disp: , Rfl:  .  amLODipine (NORVASC) 5 MG tablet, TAKE 1 TABLET BY MOUTH ONCE A DAY, Disp: 30 tablet, Rfl: 6 .  aspirin 81 MG tablet, Take 1 tablet (81 mg total) by mouth daily., Disp: , Rfl:  .  clopidogrel (PLAVIX) 75 MG tablet, TAKE 1 TABLET BY MOUTH ONCE DAILY (Patient taking differently: Take 75 mg by mouth daily. ), Disp: 30 tablet, Rfl: 3 .  guaiFENesin (MUCINEX) 600 MG 12 hr tablet, Take 600 mg by mouth 2 (two) times daily as needed (congestion)., Disp: , Rfl:  .  insulin aspart (NOVOLOG) 100 UNIT/ML injection, Inject into the skin See admin instructions. Uses via Insulin Pump, Disp: , Rfl:  .  losartan (COZAAR) 25 MG tablet, TAKE HALF A TABLET BY MOUTH DAILY (Patient taking differently: Take 12.5 mg by mouth daily. ), Disp: 90 tablet, Rfl: 0 .  MELATONIN PO, Take 1 tablet by mouth at bedtime as needed (sleep)., Disp: , Rfl:  .  metoprolol tartrate (LOPRESSOR) 25 MG tablet, TAKE 1/2 TABLET BY MOUTH TWICE A DAY AS NEEDED (Patient taking differently: Take 12.5 mg by mouth 2 (two) times daily. ), Disp: 180 tablet, Rfl: 0 .  Multiple  Vitamins-Minerals (CENTRUM SILVER 50+MEN PO), Take 1 tablet by mouth daily., Disp: , Rfl:  .  nitroGLYCERIN (NITROSTAT) 0.4 MG SL tablet, Place 1 tablet (0.4 mg total) under the tongue every 5 (five) minutes as needed for chest pain., Disp: 30 tablet, Rfl: 6 .  ranolazine (RANEXA) 500 MG 12 hr tablet, Take 1 tablet (500 mg total) by mouth 2 (two) times daily., Disp: 60 tablet, Rfl: 1  Past Medical History: Past Medical History:  Diagnosis Date  . Anginal pain ([place])   . Arthritis    "hands" (07/11/2014)  . CAD (coronary artery disease), native coronary artery 07/24/2018  . Coronary artery disease   . High cholesterol   . Hypercholesteremia 07/24/2018  . Insulin dependent diabetes mellitus   . Kidney stones    "passed them all"  . Neuromuscular disorder (H[facility])    neuropathy  . Prostate cancer ([place]) 2010  . Statin intolerance 07/24/2018  . Uncontrolled diabetes mellitus ([place]) 07/24/2018    Tobacco Use: Social History   Tobacco Use  Smoking Status Never Smoker  Smokeless Tobacco Never Used    Labs: Recent Review Flowsheet Data    Labs for ITP Cardiac and Pulmonary Rehab Latest Ref Rng & Units 02/25/2008 01/04/2011 01/05/2011 06/15/2015   Cholestrol 0 - 200 mg/dL - - 161 131   LDLCALC 0 - 99 mg/dL - - 87 61   HDL >40 mg/dL - -  51 55   Trlycerides <150 mg/dL - - 114 76   Hemoglobin A1c <5.7 % - 8.0(H) - -   TCO2 - 28 - - -       Exercise Target Goals: Exercise Program Goal: Individual exercise prescription set using results from initial 6 min walk test and THRR while considering  patient's activity barriers and safety.   Exercise Prescription Goal: Initial exercise prescription builds to 30-45 minutes a day of aerobic activity, 2-3 days per week.  Home exercise guidelines will be given to patient during program as part of exercise prescription that the participant will acknowledge.   Education: Aerobic Exercise & Resistance Training: - Gives group verbal and written instruction on  the various components of exercise. Focuses on aerobic and resistive training programs and the benefits of this training and how to safely progress through these programs..   Education: Exercise & Equipment Safety: - Individual verbal instruction and demonstration of equipment use and safety with use of the equipment.   Cardiac Rehab from 11/16/2019 in [facility] Cardiac and Pulmonary Rehab  Date 11/16/19  Educator [facility]  Instruction Review Code 1- Verbalizes Understanding      Education: Exercise Physiology & General Exercise Guidelines: - Group verbal and written instruction with models to review the exercise physiology of the cardiovascular system and associated critical values. Provides general exercise guidelines with specific guidelines to those with heart or lung disease.    Education: Flexibility, Balance, Mind/Body Relaxation: Provides group verbal/written instruction on the benefits of flexibility and balance training, including mind/body exercise modes such as yoga, pilates and tai chi.  Demonstration and skill practice provided.   Activity Barriers & Risk Stratification:  Activity Barriers & Cardiac Risk Stratification - 11/21/19 0916      Activity Barriers & Cardiac Risk Stratification   Activity Barriers Joint Problems;Deconditioning;Muscular Weakness;Chest Pain/Angina   shoulder problems   Cardiac Risk Stratification Moderate           6 Minute Walk:  6 Minute Walk    Row Name 11/21/19 0916         6 Minute Walk   Phase Initial     Distance 1210 feet     Walk Time 6 minutes     # of Rest Breaks 0     MPH 2.29     METS 3.23     RPE 7     VO2 Peak 11.28     Symptoms Yes (comment)     Comments angina 2/10     Resting HR 80 bpm     Resting BP 122/70     Resting Oxygen Saturation  97 %     Exercise Oxygen Saturation  during 6 min walk 96 %     Max Ex. HR 105 bpm     Max Ex. BP 128/64     2 Minute Post BP 122/74            Oxygen Initial  Assessment:   Oxygen Re-Evaluation:   Oxygen Discharge (Final Oxygen Re-Evaluation):   Initial Exercise Prescription:  Initial Exercise Prescription - 11/21/19 0900      Date of Initial Exercise RX and Referring Provider   Date 11/21/19    Referring Provider Adrian Prows MD      Treadmill   MPH 2.2    Grade 1.5    Minutes 15    METs 3.14      NuStep   Level 3    SPM 80    Minutes  15    METs 3      REL-XR   Level 3    Speed 50    Minutes 15    METs 3      Biostep-RELP   Level 3    SPM 50    Minutes 15    METs 3      Prescription Details   Frequency (times per week) 3    Duration Progress to 30 minutes of continuous aerobic without signs/symptoms of physical distress      Intensity   THRR 40-80% of Max Heartrate 112-143    Ratings of Perceived Exertion 11-13    Perceived Dyspnea 0-4      Progression   Progression Continue to progress workloads to maintain intensity without signs/symptoms of physical distress.      Resistance Training   Training Prescription Yes    Weight 4 lb    Reps 10-15           Perform Capillary Blood Glucose checks as needed.  Exercise Prescription Changes:  Exercise Prescription Changes    Row Name 11/21/19 0900             Response to Exercise   Blood Pressure (Admit) 122/70       Blood Pressure (Exercise) 128/64       Blood Pressure (Exit) 122/74       Heart Rate (Admit) 80 bpm       Heart Rate (Exercise) 105 bpm       Heart Rate (Exit) 77 bpm       Oxygen Saturation (Admit) 97 %       Oxygen Saturation (Exercise) 96 %       Rating of Perceived Exertion (Exercise) 7       Symptoms chest pain 2/10       Comments walk test results              Exercise Comments:   Exercise Goals and Review:  Exercise Goals    Row Name 11/21/19 0919             Exercise Goals   Increase Physical Activity Yes       Intervention Provide advice, education, support and counseling about physical activity/exercise  needs.;Develop an individualized exercise prescription for aerobic and resistive training based on initial evaluation findings, risk stratification, comorbidities and participant's personal goals.       Expected Outcomes Short Term: Attend rehab on a regular basis to increase amount of physical activity.;Long Term: Add in home exercise to make exercise part of routine and to increase amount of physical activity.;Long Term: Exercising regularly at least 3-5 days a week.       Increase Strength and Stamina Yes       Intervention Provide advice, education, support and counseling about physical activity/exercise needs.;Develop an individualized exercise prescription for aerobic and resistive training based on initial evaluation findings, risk stratification, comorbidities and participant's personal goals.       Expected Outcomes Short Term: Increase workloads from initial exercise prescription for resistance, speed, and METs.;Short Term: Perform resistance training exercises routinely during rehab and add in resistance training at home;Long Term: Improve cardiorespiratory fitness, muscular endurance and strength as measured by increased METs and functional capacity (6MWT)       Able to understand and use rate of perceived exertion (RPE) scale Yes       Intervention Provide education and explanation on how to use RPE scale  Expected Outcomes Short Term: Able to use RPE daily in rehab to express subjective intensity level;Long Term:  Able to use RPE to guide intensity level when exercising independently       Able to understand and use Dyspnea scale Yes       Intervention Provide education and explanation on how to use Dyspnea scale       Expected Outcomes Long Term: Able to use Dyspnea scale to guide intensity level when exercising independently;Short Term: Able to use Dyspnea scale daily in rehab to express subjective sense of shortness of breath during exertion       Knowledge and understanding of  Target Heart Rate Range (THRR) Yes       Intervention Provide education and explanation of THRR including how the numbers were predicted and where they are located for reference       Expected Outcomes Short Term: Able to state/look up THRR;Short Term: Able to use daily as guideline for intensity in rehab;Long Term: Able to use THRR to govern intensity when exercising independently       Able to check pulse independently Yes       Intervention Provide education and demonstration on how to check pulse in carotid and radial arteries.;Review the importance of being able to check your own pulse for safety during independent exercise       Expected Outcomes Short Term: Able to explain why pulse checking is important during independent exercise;Long Term: Able to check pulse independently and accurately       Understanding of Exercise Prescription Yes       Intervention Provide education, explanation, and written materials on patient's individual exercise prescription       Expected Outcomes Short Term: Able to explain program exercise prescription;Long Term: Able to explain home exercise prescription to exercise independently              Exercise Goals Re-Evaluation :   Discharge Exercise Prescription (Final Exercise Prescription Changes):  Exercise Prescription Changes - 11/21/19 0900      Response to Exercise   Blood Pressure (Admit) 122/70    Blood Pressure (Exercise) 128/64    Blood Pressure (Exit) 122/74    Heart Rate (Admit) 80 bpm    Heart Rate (Exercise) 105 bpm    Heart Rate (Exit) 77 bpm    Oxygen Saturation (Admit) 97 %    Oxygen Saturation (Exercise) 96 %    Rating of Perceived Exertion (Exercise) 7    Symptoms chest pain 2/10    Comments walk test results           Nutrition:  Target Goals: Understanding of nutrition guidelines, daily intake of sodium <1546m, cholesterol <2065m calories 30% from fat and 7% or less from saturated fats, daily to have 5 or more servings  of fruits and vegetables.  Education: Controlling Sodium/Reading Food Labels -Group verbal and written material supporting the discussion of sodium use in heart healthy nutrition. Review and explanation with models, verbal and written materials for utilization of the food label.   Education: General Nutrition Guidelines/Fats and Fiber: -Group instruction provided by verbal, written material, models and posters to present the general guidelines for heart healthy nutrition. Gives an explanation and review of dietary fats and fiber.   Biometrics:  Pre Biometrics - 11/21/19 0920      Pre Biometrics   Height 5' 10.5" (1.791 m)    Weight 204 lb 6.4 oz (92.7 kg)    BMI (Calculated) 28.9  Single Leg Stand 30 seconds            Nutrition Therapy Plan and Nutrition Goals:   Nutrition Assessments:  Nutrition Assessments - 11/21/19 0921      MEDFICTS Scores   Pre Score 55           MEDIFICTS Score Key:          ?70 Need to make dietary changes          40-70 Heart Healthy Diet         ? 40 Therapeutic Level Cholesterol Diet  Nutrition Goals Re-Evaluation:   Nutrition Goals Discharge (Final Nutrition Goals Re-Evaluation):   Psychosocial: Target Goals: Acknowledge presence or absence of significant depression and/or stress, maximize coping skills, provide positive support system. Participant is able to verbalize types and ability to use techniques and skills needed for reducing stress and depression.   Education: Depression - Provides group verbal and written instruction on the correlation between heart/lung disease and depressed mood, treatment options, and the stigmas associated with seeking treatment.   Education: Sleep Hygiene -Provides group verbal and written instruction about how sleep can affect your health.  Define sleep hygiene, discuss sleep cycles and impact of sleep habits. Review good sleep hygiene tips.     Education: Stress and Anxiety: - Provides  group verbal and written instruction about the health risks of elevated stress and causes of high stress.  Discuss the correlation between heart/lung disease and anxiety and treatment options. Review healthy ways to manage with stress and anxiety.    Initial Review & Psychosocial Screening:  Initial Psych Review & Screening - 11/16/19 1338      Initial Review   Current issues with Current Stress Concerns    Source of Stress Concerns Occupation;Chronic Illness    Comments Travis Fletcher has not been able to work since December due to his health and is making him stressed.      Family Dynamics   Good Support System? Yes    Comments He can look to his wife, son, daughter and sisters for support.      Barriers   Psychosocial barriers to participate in program There are no identifiable barriers or psychosocial needs.;The patient should benefit from training in stress management and relaxation.      Screening Interventions   Interventions Encouraged to exercise;To provide support and resources with identified psychosocial needs;Provide feedback about the scores to participant    Expected Outcomes Short Term goal: Utilizing psychosocial counselor, staff and physician to assist with identification of specific Stressors or current issues interfering with healing process. Setting desired goal for each stressor or current issue identified.;Long Term Goal: Stressors or current issues are controlled or eliminated.;Short Term goal: Identification and review with participant of any Quality of Life or Depression concerns found by scoring the questionnaire.;Long Term goal: The participant improves quality of Life and PHQ9 Scores as seen by post scores and/or verbalization of changes           Quality of Life Scores:   Quality of Life - 11/21/19 0921      Quality of Life   Select Quality of Life      Quality of Life Scores   Health/Function Pre 12.47 %    Socioeconomic Pre 20 %    Psych/Spiritual Pre 19.71  %    Family Pre 29.5 %    GLOBAL Pre 18.07 %          Scores of 19 and below usually indicate  a poorer quality of life in these areas.  A difference of  2-3 points is a clinically meaningful difference.  A difference of 2-3 points in the total score of the Quality of Life Index has been associated with significant improvement in overall quality of life, self-image, physical symptoms, and general health in studies assessing change in quality of life.  PHQ-9: Recent Review Flowsheet Data    Depression screen [facility] 2/9 11/21/2019   Decreased Interest 1   Down, Depressed, Hopeless 0   PHQ - 2 Score 1   Altered sleeping 0   Tired, decreased energy 2   Change in appetite 0   Feeling bad or failure about yourself  0   Trouble concentrating 0   Moving slowly or fidgety/restless 1   Suicidal thoughts 0   PHQ-9 Score 4   Difficult doing work/chores Somewhat difficult     Interpretation of Total Score  Total Score Depression Severity:  1-4 = Minimal depression, 5-9 = Mild depression, 10-14 = Moderate depression, 15-19 = Moderately severe depression, 20-27 = Severe depression   Psychosocial Evaluation and Intervention:  Psychosocial Evaluation - 11/16/19 1353      Psychosocial Evaluation & Interventions   Interventions Encouraged to exercise with the program and follow exercise prescription    Comments Travis Fletcher has not been able to work since December due to his health and is making him stressed.    Expected Outcomes Short: Continue to exercise regularly to support mental health and notify staff of any changes. Long: maintain mental health and well being through teaching of rehab or prescribed medications independently.    Continue Psychosocial Services  Follow up required by staff           Psychosocial Re-Evaluation:   Psychosocial Discharge (Final Psychosocial Re-Evaluation):   Vocational Rehabilitation: Provide vocational rehab assistance to qualifying candidates.   Vocational  Rehab Evaluation & Intervention:   Education: Education Goals: Education classes will be provided on a variety of topics geared toward better understanding of heart health and risk factor modification. Participant will state understanding/return demonstration of topics presented as noted by education test scores.  Learning Barriers/Preferences:  Learning Barriers/Preferences - 11/16/19 1340      Learning Barriers/Preferences   Learning Barriers None    Learning Preferences None           General Cardiac Education Topics:  AED/CPR: - Group verbal and written instruction with the use of models to demonstrate the basic use of the AED with the basic ABC's of resuscitation.   Anatomy & Physiology of the Heart: - Group verbal and written instruction and models provide basic cardiac anatomy and physiology, with the coronary electrical and arterial systems. Review of Valvular disease and Heart Failure   Cardiac Procedures: - Group verbal and written instruction to review commonly prescribed medications for heart disease. Reviews the medication, class of the drug, and side effects. Includes the steps to properly store meds and maintain the prescription regimen. (beta blockers and nitrates)   Cardiac Medications I: - Group verbal and written instruction to review commonly prescribed medications for heart disease. Reviews the medication, class of the drug, and side effects. Includes the steps to properly store meds and maintain the prescription regimen.   Cardiac Medications II: -Group verbal and written instruction to review commonly prescribed medications for heart disease. Reviews the medication, class of the drug, and side effects. (all other drug classes)    Go Sex-Intimacy & Heart Disease, Get SMART - Goal Setting: -  Group verbal and written instruction through game format to discuss heart disease and the return to sexual intimacy. Provides group verbal and written material to  discuss and apply goal setting through the application of the S.M.A.R.T. Method.   Other Matters of the Heart: - Provides group verbal, written materials and models to describe Stable Angina and Peripheral Artery. Includes description of the disease process and treatment options available to the cardiac patient.   Infection Prevention: - Provides verbal and written material to individual with discussion of infection control including proper hand washing and proper equipment cleaning during exercise session.   Cardiac Rehab from 11/16/2019 in [facility] Cardiac and Pulmonary Rehab  Date 11/16/19  Educator [facility]  Instruction Review Code 1- Verbalizes Understanding      Falls Prevention: - Provides verbal and written material to individual with discussion of falls prevention and safety.   Cardiac Rehab from 11/16/2019 in [facility] Cardiac and Pulmonary Rehab  Date 11/16/19  Educator [facility]  Instruction Review Code 1- Verbalizes Understanding      Other: -Provides group and verbal instruction on various topics (see comments)   Knowledge Questionnaire Score:  Knowledge Questionnaire Score - 11/21/19 0921      Knowledge Questionnaire Score   Pre Score 25/26 Education Focus: Nutrition           Core Components/Risk Factors/Patient Goals at Admission:  Personal Goals and Risk Factors at Admission - 11/21/19 0922      Core Components/Risk Factors/Patient Goals on Admission    Weight Management Yes;Weight Loss    Intervention Weight Management: Develop a combined nutrition and exercise program designed to reach desired caloric intake, while maintaining appropriate intake of nutrient and fiber, sodium and fats, and appropriate energy expenditure required for the weight goal.;Weight Management: Provide education and appropriate resources to help participant work on and attain dietary goals.    Admit Weight 204 lb 6.4 oz (92.7 kg)    Goal Weight: Short Term 199 lb (90.3 kg)    Goal Weight: Long Term 194  lb (88 kg)    Expected Outcomes Short Term: Continue to assess and modify interventions until short term weight is achieved;Long Term: Adherence to nutrition and physical activity/exercise program aimed toward attainment of established weight goal;Weight Loss: Understanding of general recommendations for a balanced deficit meal plan, which promotes 1-2 lb weight loss per week and includes a negative energy balance of [phone] kcal/d;Understanding recommendations for meals to include 15-35% energy as protein, 25-35% energy from fat, 35-60% energy from carbohydrates, less than '200mg'$  of dietary cholesterol, 20-35 gm of total fiber daily;Understanding of distribution of calorie intake throughout the day with the consumption of 4-5 meals/snacks    Improve shortness of breath with ADL's Yes    Intervention Provide education, individualized exercise plan and daily activity instruction to help decrease symptoms of SOB with activities of daily living.    Expected Outcomes Short Term: Improve cardiorespiratory fitness to achieve a reduction of symptoms when performing ADLs;Long Term: Be able to perform more ADLs without symptoms or delay the onset of symptoms    Diabetes Yes    Intervention Provide education about signs/symptoms and action to take for hypo/hyperglycemia.;Provide education about proper nutrition, including hydration, and aerobic/resistive exercise prescription along with prescribed medications to achieve blood glucose in normal ranges: Fasting glucose 65-99 mg/dL    Expected Outcomes Short Term: Participant verbalizes understanding of the signs/symptoms and immediate care of hyper/hypoglycemia, proper foot care and importance of medication, aerobic/resistive exercise and nutrition plan for blood  glucose control.;Long Term: Attainment of HbA1C < 7%.    Lipids Yes    Intervention Provide education and support for participant on nutrition & aerobic/resistive exercise along with prescribed medications to  achieve LDL <61m, HDL >450m    Expected Outcomes Short Term: Participant states understanding of desired cholesterol values and is compliant with medications prescribed. Participant is following exercise prescription and nutrition guidelines.;Long Term: Cholesterol controlled with medications as prescribed, with individualized exercise RX and with personalized nutrition plan. Value goals: LDL < 7020mHDL > 40 mg.           Education:Diabetes - Individual verbal and written instruction to review signs/symptoms of diabetes, desired ranges of glucose level fasting, after meals and with exercise. Acknowledge that pre and post exercise glucose checks will be done for 3 sessions at entry of program.   [facility]  Date 11/16/19  Educator [facility] Review Code 1- Verbalizes Understanding      Education: Know Your Numbers and Risk Factors: -Group verbal and written instruction about important numbers in your health.  Discussion of what are risk factors and how they play a role in the disease process.  Review of Cholesterol, Blood Pressure, Diabetes, and BMI and the role they play in your overall health.   Core Components/Risk Factors/Patient Goals Review:    Core Components/Risk Factors/Patient Goals at Discharge (Final Review):    ITP Comments:  ITP Comments    Row Name 11/16/19 1352 11/21/19 0915 11/23/19 [id number]    ITP Comments Virtual Visit completed. Patient informed on EP and RD appointment and 6 Minute walk test. Patient also informed of patient health questionnaires on My Chart. Patient Verbalizes understanding. [facility] Completed 6MWT and gym orientation.  Initial ITP created and sent for review to Dr. MarEmily Filbertedical Director. 30 Day review completed. Medical Director ITP review done, changes made as directed, and signed approval by Medical Director.            Comments: 30 Day  review completed. Medical Director ITP review done, changes made as directed, and signed approval by Medical Director.

## 2019-11-24 ENCOUNTER — Other Ambulatory Visit: Payer: Self-pay

## 2019-11-24 ENCOUNTER — Encounter: Payer: 59 | Admitting: *Deleted

## 2019-11-24 DIAGNOSIS — I25118 Atherosclerotic heart disease of native coronary artery with other forms of angina pectoris: Secondary | ICD-10-CM | POA: Diagnosis not present

## 2019-11-24 DIAGNOSIS — E7800 Pure hypercholesterolemia, unspecified: Secondary | ICD-10-CM

## 2019-11-24 LAB — GLUCOSE, CAPILLARY
Glucose-Capillary: 241 mg/dL — ABNORMAL HIGH (ref 70–99)
Glucose-Capillary: 274 mg/dL — ABNORMAL HIGH (ref 70–99)

## 2019-11-24 NOTE — Progress Notes (Signed)
Daily Session Note  Patient Details  Name: Travis Fletcher. MRN: [id number] Date of Birth: Jun 16, 1955 Referring Provider:     Cardiac Rehab from 11/21/2019 in [facility] Cardiac and Pulmonary Rehab  Referring Provider Adrian Prows MD      Encounter Date: 11/24/2019  Check In:  Session Check In - 11/24/19 1259      Check-In   Supervising physician immediately available to respond to emergencies See telemetry face sheet for immediately available ER MD    Location [facility]-Cardiac & Pulmonary Rehab    Staff Present Renita Papa, RN BSN;Jessica Luan Pulling, MA, RCEP, CCRP, CCET;Joseph [place] RCP,RRT,BSRT    Virtual Visit No    Medication changes reported     No    Fall or balance concerns reported    No    Warm-up and Cool-down Performed on first and last piece of equipment    Resistance Training Performed Yes    VAD Patient? No    PAD/SET Patient? No      Pain Assessment   Currently in Pain? No/denies              Social History   Tobacco Use  Smoking Status Never Smoker  Smokeless Tobacco Never Used    Goals Met:  Independence with exercise equipment Exercise tolerated well No report of cardiac concerns or symptoms Strength training completed today  Goals Unmet:  Not Applicable  Comments: Pt able to follow exercise prescription today without complaint.  Will continue to monitor for progression.    Dr. Emily Filbert is Medical Director for [place] and [facility].

## 2019-11-26 LAB — LIPID PANEL W/O CHOL/HDL RATIO
Cholesterol, Total: 273 mg/dL — ABNORMAL HIGH (ref 100–199)
HDL: 55 mg/dL (ref >39)
LDL Chol Calc (NIH): 195 mg/dL — ABNORMAL HIGH (ref 0–99)
Triglycerides: 127 mg/dL (ref 0–149)
VLDL Cholesterol Cal: 23 mg/dL (ref 5–40)

## 2019-11-27 NOTE — Progress Notes (Signed)
Cholesterol uncontrolled and will send this to your insurance and get PA for Repatha

## 2019-11-27 NOTE — Progress Notes (Signed)
Please send this to the insurance for prior authorization of  Repatha or Praluent

## 2019-11-28 ENCOUNTER — Encounter: Payer: 59 | Admitting: *Deleted

## 2019-11-28 ENCOUNTER — Other Ambulatory Visit: Payer: Self-pay

## 2019-11-28 DIAGNOSIS — I25118 Atherosclerotic heart disease of native coronary artery with other forms of angina pectoris: Secondary | ICD-10-CM | POA: Diagnosis not present

## 2019-11-28 LAB — GLUCOSE, CAPILLARY
Glucose-Capillary: 177 mg/dL — ABNORMAL HIGH (ref 70–99)
Glucose-Capillary: 154 mg/dL — ABNORMAL HIGH (ref 70–99)

## 2019-11-28 NOTE — Progress Notes (Signed)
Daily Session Note  Patient Details  Name: Travis Fletcher. MRN: [id number] Date of Birth: March 16, 1956 Referring Provider:     Cardiac Rehab from 11/21/2019 in [facility] Cardiac and Pulmonary Rehab  Referring Provider Adrian Prows MD      Encounter Date: 11/28/2019  Check In:  Session Check In - 11/28/19 1300      Check-In   Supervising physician immediately available to respond to emergencies See telemetry face sheet for immediately available ER MD    Location [facility]-Cardiac & Pulmonary Rehab    Staff Present Renita Papa, RN BSN;Joseph Hood RCP,RRT,BSRT;Melissa [place] RDN, LDN    Virtual Visit No    Medication changes reported     No    Fall or balance concerns reported    No    Warm-up and Cool-down Performed on first and last piece of equipment    Resistance Training Performed Yes    VAD Patient? No    PAD/SET Patient? No      Pain Assessment   Currently in Pain? No/denies              Social History   Tobacco Use  Smoking Status Never Smoker  Smokeless Tobacco Never Used    Goals Met:  Independence with exercise equipment Exercise tolerated well No report of cardiac concerns or symptoms Strength training completed today  Goals Unmet:  Not Applicable  Comments: Pt able to follow exercise prescription today without complaint.  Will continue to monitor for progression.    Dr. Emily Filbert is Medical Director for [place] and [facility].

## 2019-11-29 NOTE — Progress Notes (Signed)
Can you re-do the PA on this please? Thank you, PAQ

## 2019-11-30 ENCOUNTER — Encounter: Payer: 59 | Admitting: *Deleted

## 2019-11-30 ENCOUNTER — Other Ambulatory Visit: Payer: Self-pay

## 2019-11-30 DIAGNOSIS — I25118 Atherosclerotic heart disease of native coronary artery with other forms of angina pectoris: Secondary | ICD-10-CM

## 2019-11-30 NOTE — Progress Notes (Signed)
Daily Session Note  Patient Details  Name: Ector Laurel. MRN: [id number] Date of Birth: 01-29-1956 Referring Provider:     Cardiac Rehab from 11/21/2019 in [facility] Cardiac and Pulmonary Rehab  Referring Provider Adrian Prows MD      Encounter Date: 11/30/2019  Check In:  Session Check In - 11/30/19 1300      Check-In   Supervising physician immediately available to respond to emergencies See telemetry face sheet for immediately available ER MD    Location [facility]-Cardiac & Pulmonary Rehab    Staff Present Renita Papa, RN BSN;Melissa [place]ai[place] RDN, LDN;Jessica Luan Pulling, MA, RCEP, CCRP, CCET    Virtual Visit No    Medication changes reported     No    Fall or balance concerns reported    No    Warm-up and Cool-down Performed on first and last piece of equipment    Resistance Training Performed Yes    VAD Patient? No    PAD/SET Patient? No      Pain Assessment   Currently in Pain? No/denies              Social History   Tobacco Use  Smoking Status Never Smoker  Smokeless Tobacco Never Used    Goals Met:  Independence with exercise equipment Exercise tolerated well No report of cardiac concerns or symptoms Strength training completed today  Goals Unmet:  Not Applicable  Comments: Pt able to follow exercise prescription today without complaint.  Will continue to monitor for progression.    Dr. Emily Filbert is Medical Director for [place] and [facility].

## 2019-12-01 ENCOUNTER — Other Ambulatory Visit: Payer: Self-pay

## 2019-12-01 ENCOUNTER — Encounter: Payer: 59 | Admitting: *Deleted

## 2019-12-01 DIAGNOSIS — I25118 Atherosclerotic heart disease of native coronary artery with other forms of angina pectoris: Secondary | ICD-10-CM

## 2019-12-01 NOTE — Progress Notes (Signed)
Daily Session Note  Patient Details  Name: Travis Fletcher. MRN: [id number] Date of Birth: June 07, 1956 Referring Provider:     Cardiac Rehab from 11/21/2019 in [facility] Cardiac and Pulmonary Rehab  Referring Provider Adrian Prows MD      Encounter Date: 12/01/2019  Check In:  Session Check In - 12/01/19 1300      Check-In   Supervising physician immediately available to respond to emergencies See telemetry face sheet for immediately available ER MD    Location [facility]-Cardiac & Pulmonary Rehab    Staff Present Renita Papa, RN BSN;Joseph [address] [place], [place], [place], CCRP, CCET    Virtual Visit No    Medication changes reported     No    Fall or balance concerns reported    No    Warm-up and Cool-down Performed on first and last piece of equipment    Resistance Training Performed Yes    VAD Patient? No    PAD/SET Patient? No      Pain Assessment   Currently in Pain? No/denies              Social History   Tobacco Use  Smoking Status Never Smoker  Smokeless Tobacco Never Used    Goals Met:  Independence with exercise equipment Exercise tolerated well No report of cardiac concerns or symptoms Strength training completed today  Goals Unmet:  Not Applicable  Comments: Pt able to follow exercise prescription today without complaint.  Will continue to monitor for progression.    Dr. Emily Filbert is Medical Director for [place] and [facility].

## 2019-12-05 ENCOUNTER — Encounter: Payer: 59 | Admitting: *Deleted

## 2019-12-05 ENCOUNTER — Other Ambulatory Visit: Payer: Self-pay

## 2019-12-05 DIAGNOSIS — I25118 Atherosclerotic heart disease of native coronary artery with other forms of angina pectoris: Secondary | ICD-10-CM | POA: Diagnosis not present

## 2019-12-05 NOTE — Progress Notes (Signed)
Daily Session Note  Patient Details  Name: Travis Fletcher. MRN: [id number] Date of Birth: 07/25/1955 Referring Provider:     Cardiac Rehab from 11/21/2019 in [facility] Cardiac and Pulmonary Rehab  Referring Provider Adrian Prows MD      Encounter Date: 12/05/2019  Check In:  Session Check In - 12/05/19 1318      Check-In   Supervising physician immediately available to respond to emergencies See telemetry face sheet for immediately available ER MD    Location [facility]-Cardiac & Pulmonary Rehab    Staff Present Renita Papa, RN BSN;Jessica Luan Pulling, MA, RCEP, CCRP, CCET    Virtual Visit No    Medication changes reported     No    Fall or balance concerns reported    No    Warm-up and Cool-down Performed on first and last piece of equipment    Resistance Training Performed Yes    VAD Patient? No    PAD/SET Patient? No      Pain Assessment   Currently in Pain? No/denies              Social History   Tobacco Use  Smoking Status Never Smoker  Smokeless Tobacco Never Used    Goals Met:  Independence with exercise equipment Exercise tolerated well No report of cardiac concerns or symptoms Strength training completed today  Goals Unmet:  Not Applicable  Comments: Pt able to follow exercise prescription today without complaint.  Will continue to monitor for progression.    Dr. Emily Filbert is Medical Director for [place] and [facility].

## 2019-12-07 ENCOUNTER — Encounter: Payer: 59 | Admitting: *Deleted

## 2019-12-07 ENCOUNTER — Other Ambulatory Visit: Payer: Self-pay

## 2019-12-07 DIAGNOSIS — I25118 Atherosclerotic heart disease of native coronary artery with other forms of angina pectoris: Secondary | ICD-10-CM | POA: Diagnosis not present

## 2019-12-07 NOTE — Progress Notes (Signed)
Daily Session Note  Patient Details  Name: Travis Fletcher. MRN: [id number] Date of Birth: June 11, 1955 Referring Provider:     Cardiac Rehab from 11/21/2019 in [facility] Cardiac and Pulmonary Rehab  Referring Provider Adrian Prows MD      Encounter Date: 12/07/2019  Check In:  Session Check In - 12/07/19 1302      Check-In   Supervising physician immediately available to respond to emergencies See telemetry face sheet for immediately available ER MD    Location [facility]-Cardiac & Pulmonary Rehab    Staff Present Renita Papa, RN BSN;Laureen Owens Shark, BS, RRT, CPFT;Jessica [place], MA, RCEP, CCRP, CCET    Virtual Visit No    Medication changes reported     No    Fall or balance concerns reported    No    Warm-up and Cool-down Performed on first and last piece of equipment    Resistance Training Performed Yes    VAD Patient? No    PAD/SET Patient? No      Pain Assessment   Currently in Pain? No/denies              Social History   Tobacco Use  Smoking Status Never Smoker  Smokeless Tobacco Never Used    Goals Met:  Independence with exercise equipment Exercise tolerated well No report of cardiac concerns or symptoms Strength training completed today  Goals Unmet:  Not Applicable  Comments: Pt able to follow exercise prescription today without complaint.  Will continue to monitor for progression.    Dr. Emily Filbert is Medical Director for [place] and [facility].

## 2019-12-08 ENCOUNTER — Encounter: Payer: 59 | Attending: Cardiology | Admitting: *Deleted

## 2019-12-08 ENCOUNTER — Other Ambulatory Visit: Payer: Self-pay

## 2019-12-08 DIAGNOSIS — Z794 Long term (current) use of insulin: Secondary | ICD-10-CM | POA: Diagnosis not present

## 2019-12-08 DIAGNOSIS — Z9641 Presence of insulin pump (external) (internal): Secondary | ICD-10-CM | POA: Insufficient documentation

## 2019-12-08 DIAGNOSIS — R0600 Dyspnea, unspecified: Secondary | ICD-10-CM | POA: Diagnosis not present

## 2019-12-08 DIAGNOSIS — Z8546 Personal history of malignant neoplasm of prostate: Secondary | ICD-10-CM | POA: Insufficient documentation

## 2019-12-08 DIAGNOSIS — E1140 Type 2 diabetes mellitus with diabetic neuropathy, unspecified: Secondary | ICD-10-CM | POA: Diagnosis not present

## 2019-12-08 DIAGNOSIS — Z7902 Long term (current) use of antithrombotics/antiplatelets: Secondary | ICD-10-CM | POA: Diagnosis not present

## 2019-12-08 DIAGNOSIS — Z79899 Other long term (current) drug therapy: Secondary | ICD-10-CM | POA: Diagnosis not present

## 2019-12-08 DIAGNOSIS — Z87442 Personal history of urinary calculi: Secondary | ICD-10-CM | POA: Diagnosis not present

## 2019-12-08 DIAGNOSIS — E7800 Pure hypercholesterolemia, unspecified: Secondary | ICD-10-CM | POA: Insufficient documentation

## 2019-12-08 DIAGNOSIS — Z7982 Long term (current) use of aspirin: Secondary | ICD-10-CM | POA: Diagnosis not present

## 2019-12-08 DIAGNOSIS — M1990 Unspecified osteoarthritis, unspecified site: Secondary | ICD-10-CM | POA: Diagnosis not present

## 2019-12-08 DIAGNOSIS — I25118 Atherosclerotic heart disease of native coronary artery with other forms of angina pectoris: Secondary | ICD-10-CM | POA: Diagnosis present

## 2019-12-08 NOTE — Progress Notes (Signed)
Daily Session Note  Patient Details  Name: Travis Fletcher. MRN: [id number] Date of Birth: 1955/10/24 Referring Provider:     Cardiac Rehab from 11/21/2019 in [facility] Cardiac and Pulmonary Rehab  Referring Provider Adrian Prows MD      Encounter Date: 12/08/2019  Check In:  Session Check In - 12/08/19 1300      Check-In   Supervising physician immediately available to respond to emergencies See telemetry face sheet for immediately available ER MD    Location [facility]-Cardiac & Pulmonary Rehab    Staff Present Renita Papa, RN BSN;Jessica Luan Pulling, MA, RCEP, CCRP, Marylynn Pearson, [place] Exercise Physiologist;Joseph Hood RCP,RRT,BSRT    Virtual Visit No    Medication changes reported     No    Fall or balance concerns reported    No    Warm-up and Cool-down Performed on first and last piece of equipment    Resistance Training Performed Yes    VAD Patient? No    PAD/SET Patient? No      Pain Assessment   Currently in Pain? No/denies              Social History   Tobacco Use  Smoking Status Never Smoker  Smokeless Tobacco Never Used    Goals Met:  Independence with exercise equipment Exercise tolerated well No report of cardiac concerns or symptoms Strength training completed today  Goals Unmet:  Not Applicable  Comments: Pt able to follow exercise prescription today without complaint.  Will continue to monitor for progression.    Dr. Emily Filbert is Medical Director for [place] and [facility].

## 2019-12-14 ENCOUNTER — Other Ambulatory Visit: Payer: Self-pay

## 2019-12-14 ENCOUNTER — Encounter: Payer: 59 | Admitting: *Deleted

## 2019-12-14 DIAGNOSIS — I25118 Atherosclerotic heart disease of native coronary artery with other forms of angina pectoris: Secondary | ICD-10-CM

## 2019-12-14 NOTE — Progress Notes (Signed)
Daily Session Note  Patient Details  Name: Travis Fletcher. MRN: [id number] Date of Birth: 02-Dec-1955 Referring Provider:     Cardiac Rehab from 11/21/2019 in [facility] Cardiac and Pulmonary Rehab  Referring Provider Adrian Prows MD      Encounter Date: 12/14/2019  Check In:  Session Check In - 12/14/19 1351      Check-In   Supervising physician immediately available to respond to emergencies See telemetry face sheet for immediately available ER MD    Location [facility]-Cardiac & Pulmonary Rehab    Staff Present Renita Papa, RN BSN;Joseph Hood RCP,RRT,BSRT;Amanda Oletta Darter, [place], ACSM CEP, Exercise Physiologist    Virtual Visit No    Medication changes reported     No    Fall or balance concerns reported    No    Warm-up and Cool-down Performed on first and last piece of equipment    Resistance Training Performed Yes    VAD Patient? No    PAD/SET Patient? No      Pain Assessment   Currently in Pain? No/denies              Social History   Tobacco Use  Smoking Status Never Smoker  Smokeless Tobacco Never Used    Goals Met:  Independence with exercise equipment Exercise tolerated well No report of cardiac concerns or symptoms Strength training completed today  Goals Unmet:  Not Applicable  Comments: Pt able to follow exercise prescription today without complaint.  Will continue to monitor for progression.    Dr. Emily Filbert is Medical Director for [place] and [facility].

## 2019-12-15 ENCOUNTER — Other Ambulatory Visit: Payer: Self-pay

## 2019-12-15 ENCOUNTER — Encounter: Payer: 59 | Admitting: *Deleted

## 2019-12-15 DIAGNOSIS — I25118 Atherosclerotic heart disease of native coronary artery with other forms of angina pectoris: Secondary | ICD-10-CM

## 2019-12-15 NOTE — Progress Notes (Signed)
Daily Session Note  Patient Details  Name: Travis Fletcher. MRN: [id number] Date of Birth: [birth year] Referring Provider:     Cardiac Rehab from 11/21/2019 in [facility] Cardiac and Pulmonary Rehab  Referring Provider Adrian Prows MD      Encounter Date: 12/15/2019  Check In:  Session Check In - 12/15/19 1336      Check-In   Supervising physician immediately available to respond to emergencies See telemetry face sheet for immediately available ER MD    Location [facility]-Cardiac & Pulmonary Rehab    Staff Present Renita Papa, RN BSN;Joseph [address] [place], [place], [place], CCRP, [place], [place], ACSM CEP, Exercise Physiologist    Virtual Visit No    Medication changes reported     No    Fall or balance concerns reported    No    Warm-up and Cool-down Performed on first and last piece of equipment    Resistance Training Performed Yes    VAD Patient? No    PAD/SET Patient? No      Pain Assessment   Currently in Pain? No/denies              Social History   Tobacco Use  Smoking Status Never Smoker  Smokeless Tobacco Never Used    Goals Met:  Independence with exercise equipment Exercise tolerated well No report of cardiac concerns or symptoms Strength training completed today  Goals Unmet:  Not Applicable  Comments: Pt able to follow exercise prescription today without complaint.  Will continue to monitor for progression.    Dr. Emily Filbert is Medical Director for [place] and [facility].

## 2019-12-19 ENCOUNTER — Other Ambulatory Visit: Payer: Self-pay

## 2019-12-19 ENCOUNTER — Encounter: Payer: 59 | Admitting: *Deleted

## 2019-12-19 DIAGNOSIS — I25118 Atherosclerotic heart disease of native coronary artery with other forms of angina pectoris: Secondary | ICD-10-CM

## 2019-12-19 NOTE — Progress Notes (Signed)
Daily Session Note  Patient Details  Name: Travis Fletcher. MRN: [id number] Date of Birth: 01-21-1956 Referring Provider:     Cardiac Rehab from 11/21/2019 in [facility] Cardiac and Pulmonary Rehab  Referring Provider Adrian Prows MD      Encounter Date: 12/19/2019  Check In:  Session Check In - 12/19/19 1350      Check-In   Supervising physician immediately available to respond to emergencies See telemetry face sheet for immediately available ER MD    Location [facility]-Cardiac & Pulmonary Rehab    Staff Present Renita Papa, RN Margurite Auerbach, MS Exercise Physiologist;Amanda Oletta Darter, BA, ACSM CEP, Exercise Physiologist;Kelly Amedeo Plenty, BS, ACSM CEP, Exercise Physiologist    Virtual Visit No    Medication changes reported     No    Fall or balance concerns reported    No    Warm-up and Cool-down Performed on first and last piece of equipment    Resistance Training Performed Yes    VAD Patient? No    PAD/SET Patient? No      Pain Assessment   Currently in Pain? No/denies              Social History   Tobacco Use  Smoking Status Never Smoker  Smokeless Tobacco Never Used    Goals Met:  Independence with exercise equipment Exercise tolerated well No report of cardiac concerns or symptoms Strength training completed today  Goals Unmet:  Not Applicable  Comments: Pt able to follow exercise prescription today without complaint.  Will continue to monitor for progression.    Dr. Emily Filbert is Medical Director for [place] and [facility].

## 2019-12-21 ENCOUNTER — Other Ambulatory Visit: Payer: Self-pay

## 2019-12-21 ENCOUNTER — Encounter: Payer: Self-pay | Admitting: *Deleted

## 2019-12-21 ENCOUNTER — Encounter: Payer: 59 | Admitting: *Deleted

## 2019-12-21 DIAGNOSIS — I25118 Atherosclerotic heart disease of native coronary artery with other forms of angina pectoris: Secondary | ICD-10-CM

## 2019-12-21 NOTE — Progress Notes (Signed)
Daily Session Note  Patient Details  Name: Travis Fletcher. MRN: [id number] Date of Birth: 1955/11/15 Referring Provider:     Cardiac Rehab from 11/21/2019 in [facility] Cardiac and Pulmonary Rehab  Referring Provider Adrian Prows MD      Encounter Date: 12/21/2019  Check In:  Session Check In - 12/21/19 1353      Check-In   Supervising physician immediately available to respond to emergencies See telemetry face sheet for immediately available ER MD    Location [facility]-Cardiac & Pulmonary Rehab    Staff Present Renita Papa, RN BSN;Joseph Lou [place], [place] Exercise Physiologist    Virtual Visit No    Medication changes reported     No    Fall or balance concerns reported    No    Warm-up and Cool-down Performed on first and last piece of equipment    Resistance Training Performed Yes    VAD Patient? No    PAD/SET Patient? No      Pain Assessment   Currently in Pain? No/denies              Social History   Tobacco Use  Smoking Status Never Smoker  Smokeless Tobacco Never Used    Goals Met:  Independence with exercise equipment Exercise tolerated well No report of cardiac concerns or symptoms Strength training completed today  Goals Unmet:  Not Applicable  Comments: Pt able to follow exercise prescription today without complaint.  Will continue to monitor for progression.    Dr. Emily Filbert is Medical Director for [place] and [facility].

## 2019-12-21 NOTE — Progress Notes (Signed)
Cardiac Individual Treatment Plan  Patient Details  Name: Travis Fletcher. MRN: [id number] Date of Birth: 27-Oct-1955 Referring Provider:     Cardiac Rehab from 11/21/2019 in [facility] Cardiac and Pulmonary Rehab  Referring Provider Adrian Prows MD      Initial Encounter Date:    Cardiac Rehab from 11/21/2019 in [facility] Cardiac and Pulmonary Rehab  Date 11/21/19      Visit Diagnosis: Coronary artery disease of native artery of native heart with stable angina pectoris ([place])  Patient's Home Medications on Admission:  Current Outpatient Medications:    acetaminophen (TYLENOL) 325 MG tablet, Take 650 mg by mouth every 6 (six) hours as needed for moderate pain or headache., Disp: , Rfl:    albuterol (VENTOLIN HFA) 108 (90 Base) MCG/ACT inhaler, Inhale 1 puff into the lungs every 6 (six) hours as needed for wheezing or shortness of breath. , Disp: , Rfl:    amLODipine (NORVASC) 5 MG tablet, TAKE 1 TABLET BY MOUTH ONCE A DAY, Disp: 30 tablet, Rfl: 6   aspirin 81 MG tablet, Take 1 tablet (81 mg total) by mouth daily., Disp: , Rfl:    clopidogrel (PLAVIX) 75 MG tablet, TAKE 1 TABLET BY MOUTH ONCE DAILY (Patient taking differently: Take 75 mg by mouth daily. ), Disp: 30 tablet, Rfl: 3   guaiFENesin (MUCINEX) 600 MG 12 hr tablet, Take 600 mg by mouth 2 (two) times daily as needed (congestion)., Disp: , Rfl:    insulin aspart (NOVOLOG) 100 UNIT/ML injection, Inject into the skin See admin instructions. Uses via Insulin Pump, Disp: , Rfl:    losartan (COZAAR) 25 MG tablet, TAKE HALF A TABLET BY MOUTH DAILY (Patient taking differently: Take 12.5 mg by mouth daily. ), Disp: 90 tablet, Rfl: 0   MELATONIN PO, Take 1 tablet by mouth at bedtime as needed (sleep)., Disp: , Rfl:    metoprolol tartrate (LOPRESSOR) 25 MG tablet, TAKE 1/2 TABLET BY MOUTH TWICE A DAY AS NEEDED (Patient taking differently: Take 12.5 mg by mouth 2 (two) times daily. ), Disp: 180 tablet, Rfl: 0   Multiple  Vitamins-Minerals (CENTRUM SILVER 50+MEN PO), Take 1 tablet by mouth daily., Disp: , Rfl:    nitroGLYCERIN (NITROSTAT) 0.4 MG SL tablet, Place 1 tablet (0.4 mg total) under the tongue every 5 (five) minutes as needed for chest pain., Disp: 30 tablet, Rfl: 6   ranolazine (RANEXA) 500 MG 12 hr tablet, Take 1 tablet (500 mg total) by mouth 2 (two) times daily., Disp: 60 tablet, Rfl: 1  Past Medical History: Past Medical History:  Diagnosis Date   Anginal pain ([place])    Arthritis    "hands" (07/11/2014)   CAD (coronary artery disease), native coronary artery 07/24/2018   Coronary artery disease    High cholesterol    Hypercholesteremia 07/24/2018   Insulin dependent diabetes mellitus    Kidney stones    "passed them all"   Neuromuscular disorder ([place])    neuropathy   Prostate cancer ([place]) 2010   Statin intolerance 07/24/2018   Uncontrolled diabetes mellitus ([place]) 07/24/2018    Tobacco Use: Social History   Tobacco Use  Smoking Status Never Smoker  Smokeless Tobacco Never Used    Labs: Recent Review Flowsheet Data    Labs for ITP Cardiac and Pulmonary Rehab Latest Ref Rng & Units 02/25/2008 01/04/2011 01/05/2011 06/15/2015 11/25/2019   Cholestrol 100 - 199 mg/dL - - 161 131 273(H)   LDLCALC 0 - 99 mg/dL - - 87 61 195(H)  HDL >39 mg/dL - - 51 55 55   Trlycerides 0 - 149 mg/dL - - 114 76 127   Hemoglobin A1c <5.7 % - 8.0(H) - - -   TCO2 - 28 - - - -       Exercise Target Goals: Exercise Program Goal: Individual exercise prescription set using results from initial 6 min walk test and THRR while considering  patients activity barriers and safety.   Exercise Prescription Goal: Initial exercise prescription builds to 30-45 minutes a day of aerobic activity, 2-3 days per week.  Home exercise guidelines will be given to patient during program as part of exercise prescription that the participant will acknowledge.   Education: Aerobic Exercise & Resistance Training: - Gives  group verbal and written instruction on the various components of exercise. Focuses on aerobic and resistive training programs and the benefits of this training and how to safely progress through these programs..   Education: Exercise & Equipment Safety: - Individual verbal instruction and demonstration of equipment use and safety with use of the equipment.   Cardiac Rehab from 11/16/2019 in [facility] Cardiac and Pulmonary Rehab  Date 11/16/19  Educator [facility]  Instruction Review Code 1- Verbalizes Understanding      Education: Exercise Physiology & General Exercise Guidelines: - Group verbal and written instruction with models to review the exercise physiology of the cardiovascular system and associated critical values. Provides general exercise guidelines with specific guidelines to those with heart or lung disease.    Education: Flexibility, Balance, Mind/Body Relaxation: Provides group verbal/written instruction on the benefits of flexibility and balance training, including mind/body exercise modes such as yoga, pilates and tai chi.  Demonstration and skill practice provided.   Activity Barriers & Risk Stratification:  Activity Barriers & Cardiac Risk Stratification - 11/21/19 0916      Activity Barriers & Cardiac Risk Stratification   Activity Barriers Joint Problems;Deconditioning;Muscular Weakness;Chest Pain/Angina   shoulder problems   Cardiac Risk Stratification Moderate           6 Minute Walk:  6 Minute Walk    Row Name 11/21/19 0916         6 Minute Walk   Phase Initial     Distance 1210 feet     Walk Time 6 minutes     # of Rest Breaks 0     MPH 2.29     METS 3.23     RPE 7     VO2 Peak 11.28     Symptoms Yes (comment)     Comments angina 2/10     Resting HR 80 bpm     Resting BP 122/70     Resting Oxygen Saturation  97 %     Exercise Oxygen Saturation  during 6 min walk 96 %     Max Ex. HR 105 bpm     Max Ex. BP 128/64     2 Minute Post BP 122/74             Oxygen Initial Assessment:   Oxygen Re-Evaluation:   Oxygen Discharge (Final Oxygen Re-Evaluation):   Initial Exercise Prescription:  Initial Exercise Prescription - 11/21/19 0900      Date of Initial Exercise RX and Referring Provider   Date 11/21/19    Referring Provider Adrian Prows MD      Treadmill   MPH 2.2    Grade 1.5    Minutes 15    METs 3.14      NuStep  Level 3    SPM 80    Minutes 15    METs 3      REL-XR   Level 3    Speed 50    Minutes 15    METs 3      Biostep-RELP   Level 3    SPM 50    Minutes 15    METs 3      Prescription Details   Frequency (times per week) 3    Duration Progress to 30 minutes of continuous aerobic without signs/symptoms of physical distress      Intensity   THRR 40-80% of Max Heartrate 112-143    Ratings of Perceived Exertion 11-13    Perceived Dyspnea 0-4      Progression   Progression Continue to progress workloads to maintain intensity without signs/symptoms of physical distress.      Resistance Training   Training Prescription Yes    Weight 4 lb    Reps 10-15           Perform Capillary Blood Glucose checks as needed.  Exercise Prescription Changes:  Exercise Prescription Changes    Row Name 11/21/19 0900 11/28/19 1600 12/13/19 1300         Response to Exercise   Blood Pressure (Admit) 122/70 112/54 130/62     Blood Pressure (Exercise) 128/64 162/64 122/64     Blood Pressure (Exit) 122/74 124/64 124/66     Heart Rate (Admit) 80 bpm 85 bpm 81 bpm     Heart Rate (Exercise) 105 bpm 116 bpm 122 bpm     Heart Rate (Exit) 77 bpm 92 bpm 96 bpm     Oxygen Saturation (Admit) 97 % -- --     Oxygen Saturation (Exercise) 96 % -- --     Rating of Perceived Exertion (Exercise) 7 12 13      Symptoms chest pain 2/10 none none     Comments walk test results -- --     Duration -- Continue with 30 min of aerobic exercise without signs/symptoms of physical distress. Progress to 30 minutes of  aerobic without  signs/symptoms of physical distress     Intensity -- THRR unchanged THRR unchanged       Progression   Progression -- Continue to progress workloads to maintain intensity without signs/symptoms of physical distress. Continue to progress workloads to maintain intensity without signs/symptoms of physical distress.     Average METs -- 3.04 3.07       Resistance Training   Training Prescription -- Yes Yes     Weight -- 4 lb 4 lb     Reps -- 10-15 10-15       Interval Training   Interval Training -- No No       Treadmill   MPH -- 2.2 2.5     Grade -- 1.5 1.5     Minutes -- 15 15     METs -- 3.14 3.43       NuStep   Level -- 3 5     Minutes -- 15 15     METs -- 2.5 3.6       REL-XR   Level -- 3 5     Minutes -- 15 15     METs -- 3.5 2.4       Biostep-RELP   Level -- 3 4     Minutes -- 15 15     METs -- 3 3  Exercise Comments:   Exercise Goals and Review:  Exercise Goals    Row Name 11/21/19 0919             Exercise Goals   Increase Physical Activity Yes       Intervention Provide advice, education, support and counseling about physical activity/exercise needs.;Develop an individualized exercise prescription for aerobic and resistive training based on initial evaluation findings, risk stratification, comorbidities and participant's personal goals.       Expected Outcomes Short Term: Attend rehab on a regular basis to increase amount of physical activity.;Long Term: Add in home exercise to make exercise part of routine and to increase amount of physical activity.;Long Term: Exercising regularly at least 3-5 days a week.       Increase Strength and Stamina Yes       Intervention Provide advice, education, support and counseling about physical activity/exercise needs.;Develop an individualized exercise prescription for aerobic and resistive training based on initial evaluation findings, risk stratification, comorbidities and participant's personal goals.        Expected Outcomes Short Term: Increase workloads from initial exercise prescription for resistance, speed, and METs.;Short Term: Perform resistance training exercises routinely during rehab and add in resistance training at home;Long Term: Improve cardiorespiratory fitness, muscular endurance and strength as measured by increased METs and functional capacity (6MWT)       Able to understand and use rate of perceived exertion (RPE) scale Yes       Intervention Provide education and explanation on how to use RPE scale       Expected Outcomes Short Term: Able to use RPE daily in rehab to express subjective intensity level;Long Term:  Able to use RPE to guide intensity level when exercising independently       Able to understand and use Dyspnea scale Yes       Intervention Provide education and explanation on how to use Dyspnea scale       Expected Outcomes Long Term: Able to use Dyspnea scale to guide intensity level when exercising independently;Short Term: Able to use Dyspnea scale daily in rehab to express subjective sense of shortness of breath during exertion       Knowledge and understanding of Target Heart Rate Range (THRR) Yes       Intervention Provide education and explanation of THRR including how the numbers were predicted and where they are located for reference       Expected Outcomes Short Term: Able to state/look up THRR;Short Term: Able to use daily as guideline for intensity in rehab;Long Term: Able to use THRR to govern intensity when exercising independently       Able to check pulse independently Yes       Intervention Provide education and demonstration on how to check pulse in carotid and radial arteries.;Review the importance of being able to check your own pulse for safety during independent exercise       Expected Outcomes Short Term: Able to explain why pulse checking is important during independent exercise;Long Term: Able to check pulse independently and accurately        Understanding of Exercise Prescription Yes       Intervention Provide education, explanation, and written materials on patient's individual exercise prescription       Expected Outcomes Short Term: Able to explain program exercise prescription;Long Term: Able to explain home exercise prescription to exercise independently              Exercise Goals Re-Evaluation :  Exercise Goals Re-Evaluation    Row Name 11/23/19 1309 11/28/19 1621 12/01/19 1310 12/13/19 1405       Exercise Goal Re-Evaluation   Exercise Goals Review Increase Physical Activity;Able to understand and use rate of perceived exertion (RPE) scale;Knowledge and understanding of Target Heart Rate Range (THRR);Understanding of Exercise Prescription;Increase Strength and Stamina;Able to check pulse independently Increase Physical Activity;Increase Strength and Stamina;Understanding of Exercise Prescription Increase Physical Activity;Increase Strength and Stamina Increase Physical Activity;Increase Strength and Stamina;Understanding of Exercise Prescription    Comments Reviewed RPE and dyspnea scales, THR and program prescription with pt today.  Pt voiced understanding and was given a copy of goals to take home. [name] is off to a good start in rehab.  He has completed his first three full days of exercise.  He is already up to 3.5 METs onthe XR.  We will continue to monitor his progress. Patient is not doing any home exercise at home as of yet. He is doing house work at this time and does not have enough time to put in exercise other than rehab. [name] is doing well. He has already reached level 4 on Biostep and level 5 on the NuStep. Will continue to monitor progress.    Expected Outcomes Short: Use RPE daily to regulate intensity. Long: Follow program prescription in THR. Short; Continue to attend rehab regularly Long: Continue to follow program prescription Short: work on home xercise with EP's. Long: maintain a home exercise routine.  Short: Increase handweights Long: Increase stamina/endurance and aim to progress MET level           Discharge Exercise Prescription (Final Exercise Prescription Changes):  Exercise Prescription Changes - 12/13/19 1300      Response to Exercise   Blood Pressure (Admit) 130/62    Blood Pressure (Exercise) 122/64    Blood Pressure (Exit) 124/66    Heart Rate (Admit) 81 bpm    Heart Rate (Exercise) 122 bpm    Heart Rate (Exit) 96 bpm    Rating of Perceived Exertion (Exercise) 13    Symptoms none    Duration Progress to 30 minutes of  aerobic without signs/symptoms of physical distress    Intensity THRR unchanged      Progression   Progression Continue to progress workloads to maintain intensity without signs/symptoms of physical distress.    Average METs 3.07      Resistance Training   Training Prescription Yes    Weight 4 lb    Reps 10-15      Interval Training   Interval Training No      Treadmill   MPH 2.5    Grade 1.5    Minutes 15    METs 3.43      NuStep   Level 5    Minutes 15    METs 3.6      REL-XR   Level 5    Minutes 15    METs 2.4      Biostep-RELP   Level 4    Minutes 15    METs 3           Nutrition:  Target Goals: Understanding of nutrition guidelines, daily intake of sodium '1500mg'$ , cholesterol '200mg'$ , calories 30% from fat and 7% or less from saturated fats, daily to have 5 or more servings of fruits and vegetables.  Education: Controlling Sodium/Reading Food Labels -Group verbal and written material supporting the discussion of sodium use in heart healthy nutrition. Review and explanation with models, verbal and written  materials for utilization of the food label.   Education: General Nutrition Guidelines/Fats and Fiber: -Group instruction provided by verbal, written material, models and posters to present the general guidelines for heart healthy nutrition. Gives an explanation and review of dietary fats and fiber.   Biometrics:   Pre Biometrics - 11/21/19 0920      Pre Biometrics   Height 5' 10.5" (1.791 m)    Weight 204 lb 6.4 oz (92.7 kg)    BMI (Calculated) 28.9    Single Leg Stand 30 seconds            Nutrition Therapy Plan and Nutrition Goals:   Nutrition Assessments:  Nutrition Assessments - 11/21/19 0921      MEDFICTS Scores   Pre Score 55           MEDIFICTS Score Key:          ?70 Need to make dietary changes          40-70 Heart Healthy Diet         ? 40 Therapeutic Level Cholesterol Diet  Nutrition Goals Re-Evaluation:  Nutrition Goals Re-Evaluation    [place] Name 12/01/19 1316             Goals   Current Weight 205 lb (93 kg)       Nutrition Goal Patient would like to get down to 180lbs.       Comment [name] states his eating habit are not bad. He sometimes over eats but not often. He is willing to change his diet and meet with the dietician.       Expected Outcome Short: meet with Dietician. Long: form a nutrition plan that meets his needs.              Nutrition Goals Discharge (Final Nutrition Goals Re-Evaluation):  Nutrition Goals Re-Evaluation - 12/01/19 1316      Goals   Current Weight 205 lb (93 kg)    Nutrition Goal Patient would like to get down to 180lbs.    Comment [name] states his eating habit are not bad. He sometimes over eats but not often. He is willing to change his diet and meet with the dietician.    Expected Outcome Short: meet with Dietician. Long: form a nutrition plan that meets his needs.           Psychosocial: Target Goals: Acknowledge presence or absence of significant depression and/or stress, maximize coping skills, provide positive support system. Participant is able to verbalize types and ability to use techniques and skills needed for reducing stress and depression.   Education: Depression - Provides group verbal and written instruction on the correlation between heart/lung disease and depressed mood, treatment options, and the  stigmas associated with seeking treatment.   Education: Sleep Hygiene -Provides group verbal and written instruction about how sleep can affect your health.  Define sleep hygiene, discuss sleep cycles and impact of sleep habits. Review good sleep hygiene tips.     Education: Stress and Anxiety: - Provides group verbal and written instruction about the health risks of elevated stress and causes of high stress.  Discuss the correlation between heart/lung disease and anxiety and treatment options. Review healthy ways to manage with stress and anxiety.    Initial Review & Psychosocial Screening:  Initial Psych Review & Screening - 11/16/19 1338      Initial Review   Current issues with Current Stress Concerns    Source of Stress Concerns Occupation;Chronic Illness  Comments [name] has not been able to work since December due to his health and is making him stressed.      Family Dynamics   Good Support System? Yes    Comments He can look to his wife, son, daughter and sisters for support.      Barriers   Psychosocial barriers to participate in program There are no identifiable barriers or psychosocial needs.;The patient should benefit from training in stress management and relaxation.      Screening Interventions   Interventions Encouraged to exercise;To provide support and resources with identified psychosocial needs;Provide feedback about the scores to participant    Expected Outcomes Short Term goal: Utilizing psychosocial counselor, staff and physician to assist with identification of specific Stressors or current issues interfering with healing process. Setting desired goal for each stressor or current issue identified.;Long Term Goal: Stressors or current issues are controlled or eliminated.;Short Term goal: Identification and review with participant of any Quality of Life or Depression concerns found by scoring the questionnaire.;Long Term goal: The participant improves quality of  Life and PHQ9 Scores as seen by post scores and/or verbalization of changes           Quality of Life Scores:   Quality of Life - 11/21/19 0921      Quality of Life   Select Quality of Life      Quality of Life Scores   Health/Function Pre 12.47 %    Socioeconomic Pre 20 %    Psych/Spiritual Pre 19.71 %    Family Pre 29.5 %    GLOBAL Pre 18.07 %          Scores of 19 and below usually indicate a poorer quality of life in these areas.  A difference of  2-3 points is a clinically meaningful difference.  A difference of 2-3 points in the total score of the Quality of Life Index has been associated with significant improvement in overall quality of life, self-image, physical symptoms, and general health in studies assessing change in quality of life.  PHQ-9: Recent Review Flowsheet Data    Depression screen [facility] 2/9 11/21/2019   Decreased Interest 1   Down, Depressed, Hopeless 0   PHQ - 2 Score 1   Altered sleeping 0   Tired, decreased energy 2   Change in appetite 0   Feeling bad or failure about yourself  0   Trouble concentrating 0   Moving slowly or fidgety/restless 1   Suicidal thoughts 0   PHQ-9 Score 4   Difficult doing work/chores Somewhat difficult     Interpretation of Total Score  Total Score Depression Severity:  1-4 = Minimal depression, 5-9 = Mild depression, 10-14 = Moderate depression, 15-19 = Moderately severe depression, 20-27 = Severe depression   Psychosocial Evaluation and Intervention:  Psychosocial Evaluation - 11/16/19 1353      Psychosocial Evaluation & Interventions   Interventions Encouraged to exercise with the program and follow exercise prescription    Comments [name] has not been able to work since December due to his health and is making him stressed.    Expected Outcomes Short: Continue to exercise regularly to support mental health and notify staff of any changes. Long: maintain mental health and well being through teaching of rehab or  prescribed medications independently.    Continue Psychosocial Services  Follow up required by staff           Psychosocial Re-Evaluation:  Psychosocial Re-Evaluation    [place] Name 12/01/19  1312             Psychosocial Re-Evaluation   Current issues with Current Stress Concerns       Comments He has not had a paycheck since October due to being sick and it is stressing him out.       Expected Outcomes Short: Continue to exercise regularly to support mental health and notify staff of any changes. Long: maintain mental health and well being through teaching of rehab or prescribed medications independently.       Interventions Encouraged to attend Cardiac Rehabilitation for the exercise       Continue Psychosocial Services  Follow up required by staff              Psychosocial Discharge (Final Psychosocial Re-Evaluation):  Psychosocial Re-Evaluation - 12/01/19 1312      Psychosocial Re-Evaluation   Current issues with Current Stress Concerns    Comments He has not had a paycheck since October due to being sick and it is stressing him out.    Expected Outcomes Short: Continue to exercise regularly to support mental health and notify staff of any changes. Long: maintain mental health and well being through teaching of rehab or prescribed medications independently.    Interventions Encouraged to attend Cardiac Rehabilitation for the exercise    Continue Psychosocial Services  Follow up required by staff           Vocational Rehabilitation: Provide vocational rehab assistance to qualifying candidates.   Vocational Rehab Evaluation & Intervention:   Education: Education Goals: Education classes will be provided on a variety of topics geared toward better understanding of heart health and risk factor modification. Participant will state understanding/return demonstration of topics presented as noted by education test scores.  Learning Barriers/Preferences:  Learning  Barriers/Preferences - 11/16/19 1340      Learning Barriers/Preferences   Learning Barriers None    Learning Preferences None           General Cardiac Education Topics:  AED/CPR: - Group verbal and written instruction with the use of models to demonstrate the basic use of the AED with the basic ABC's of resuscitation.   Anatomy & Physiology of the Heart: - Group verbal and written instruction and models provide basic cardiac anatomy and physiology, with the coronary electrical and arterial systems. Review of Valvular disease and Heart Failure   Cardiac Procedures: - Group verbal and written instruction to review commonly prescribed medications for heart disease. Reviews the medication, class of the drug, and side effects. Includes the steps to properly store meds and maintain the prescription regimen. (beta blockers and nitrates)   Cardiac Medications I: - Group verbal and written instruction to review commonly prescribed medications for heart disease. Reviews the medication, class of the drug, and side effects. Includes the steps to properly store meds and maintain the prescription regimen.   Cardiac Medications II: -Group verbal and written instruction to review commonly prescribed medications for heart disease. Reviews the medication, class of the drug, and side effects. (all other drug classes)    Go Sex-Intimacy & Heart Disease, Get SMART - Goal Setting: - Group verbal and written instruction through game format to discuss heart disease and the return to sexual intimacy. Provides group verbal and written material to discuss and apply goal setting through the application of the S.M.A.R.T. Method.   Other Matters of the Heart: - Provides group verbal, written materials and models to describe Stable Angina and Peripheral Artery. Includes description of  the disease process and treatment options available to the cardiac patient.   Infection Prevention: - Provides verbal and  written material to individual with discussion of infection control including proper hand washing and proper equipment cleaning during exercise session.   Cardiac Rehab from 11/16/2019 in [facility] Cardiac and Pulmonary Rehab  Date 11/16/19  Educator [facility]  Instruction Review Code 1- Verbalizes Understanding      Falls Prevention: - Provides verbal and written material to individual with discussion of falls prevention and safety.   Cardiac Rehab from 11/16/2019 in [facility] Cardiac and Pulmonary Rehab  Date 11/16/19  Educator [facility]  Instruction Review Code 1- Verbalizes Understanding      Other: -Provides group and verbal instruction on various topics (see comments)   Knowledge Questionnaire Score:  Knowledge Questionnaire Score - 11/21/19 0921      Knowledge Questionnaire Score   Pre Score 25/26 Education Focus: Nutrition           Core Components/Risk Factors/Patient Goals at Admission:  Personal Goals and Risk Factors at Admission - 11/21/19 0922      Core Components/Risk Factors/Patient Goals on Admission    Weight Management Yes;Weight Loss    Intervention Weight Management: Develop a combined nutrition and exercise program designed to reach desired caloric intake, while maintaining appropriate intake of nutrient and fiber, sodium and fats, and appropriate energy expenditure required for the weight goal.;Weight Management: Provide education and appropriate resources to help participant work on and attain dietary goals.    Admit Weight 204 lb 6.4 oz (92.7 kg)    Goal Weight: Short Term 199 lb (90.3 kg)    Goal Weight: Long Term 194 lb (88 kg)    Expected Outcomes Short Term: Continue to assess and modify interventions until short term weight is achieved;Long Term: Adherence to nutrition and physical activity/exercise program aimed toward attainment of established weight goal;Weight Loss: Understanding of general recommendations for a balanced deficit meal plan, which promotes 1-2 lb weight  loss per week and includes a negative energy balance of [id number] kcal/d;Understanding recommendations for meals to include 15-35% energy as protein, 25-35% energy from fat, 35-60% energy from carbohydrates, less than 266m of dietary cholesterol, 20-35 gm of total fiber daily;Understanding of distribution of calorie intake throughout the day with the consumption of 4-5 meals/snacks    Improve shortness of breath with ADL's Yes    Intervention Provide education, individualized exercise plan and daily activity instruction to help decrease symptoms of SOB with activities of daily living.    Expected Outcomes Short Term: Improve cardiorespiratory fitness to achieve a reduction of symptoms when performing ADLs;Long Term: Be able to perform more ADLs without symptoms or delay the onset of symptoms    Diabetes Yes    Intervention Provide education about signs/symptoms and action to take for hypo/hyperglycemia.;Provide education about proper nutrition, including hydration, and aerobic/resistive exercise prescription along with prescribed medications to achieve blood glucose in normal ranges: Fasting glucose 65-99 mg/dL    Expected Outcomes Short Term: Participant verbalizes understanding of the signs/symptoms and immediate care of hyper/hypoglycemia, proper foot care and importance of medication, aerobic/resistive exercise and nutrition plan for blood glucose control.;Long Term: Attainment of HbA1C < 7%.    Lipids Yes    Intervention Provide education and support for participant on nutrition & aerobic/resistive exercise along with prescribed medications to achieve LDL <756m HDL >4024m   Expected Outcomes Short Term: Participant states understanding of desired cholesterol values and is compliant with medications prescribed. Participant is following exercise  prescription and nutrition guidelines.;Long Term: Cholesterol controlled with medications as prescribed, with individualized exercise RX and with personalized  nutrition plan. Value goals: LDL < 23m, HDL > 40 mg.           Education:Diabetes - Individual verbal and written instruction to review signs/symptoms of diabetes, desired ranges of glucose level fasting, after meals and with exercise. Acknowledge that pre and post exercise glucose checks will be done for 3 sessions at entry of program.   [facility]  [facility]16[facility]19  [facility] Instruction Review Code 1- Verbalizes Understanding      Education: Know Your Numbers and Risk Factors: -Group verbal and written instruction about important numbers in your health.  Discussion of what are risk factors and how they play a role in the disease process.  Review of Cholesterol, Blood Pressure, Diabetes, and BMI and the role they play in your overall health.   Core Components/Risk Factors/Patient Goals Review:   Goals and Risk Factor Review    Row Name 12/01/19 1314             Core Components/Risk Factors/Patient Goals Review   Personal Goals Review Weight Management/Obesity;Lipids;Diabetes;Improve shortness of breath with ADL's       Review Spoke to patient about their shortness of breath and what they can do to improve. Patient has been informed of breathing techniques when starting the program. Patient is informed to tell staff if they have had any med changes and that certain meds they are taking or not taking can be causing shortness of breath.       Expected Outcomes Short: Attend HeartTrack regularly to improve shortness of breath with ADLs. Long: maintain independence with ADLs              Core Components/Risk Factors/Patient Goals at Discharge (Final Review):   Goals and Risk Factor Review - 12/01/19 1314      Core Components/Risk Factors/Patient Goals Review   Personal Goals Review Weight Management/Obesity;Lipids;Diabetes;Improve shortness of breath with ADL's    Review Spoke to patient about their shortness of breath and  what they can do to improve. Patient has been informed of breathing techniques when starting the program. Patient is informed to tell staff if they have had any med changes and that certain meds they are taking or not taking can be causing shortness of breath.    Expected Outcomes Short: Attend HeartTrack regularly to improve shortness of breath with ADLs. Long: maintain independence with ADLs           ITP Comments:  ITP Comments    Row Name 11/16/19 1352 11/21/19 0915 11/23/19 0608 11/23/19 1308 12/21/19 0759   ITP Comments Virtual Visit completed. Patient informed on EP and RD appointment and 6 Minute walk test. Patient also informed of patient health questionnaires on My Chart. Patient Verbalizes understanding. [facility]. Completed 6MWT and gym orientation.  Initial ITP created and sent for review to Dr. MEmily Filbert Medical Director. 30 Day review completed. Medical Director ITP review done, changes made as directed, and signed approval by Medical Director. First full day of exercise!  Patient was oriented to gym and equipment including functions, settings, policies, and procedures.  Patient's individual exercise prescription and treatment plan were reviewed.  All starting workloads were established based on the results of the 6 minute walk test done at initial orientation visit.  The plan for exercise progression was also  introduced and progression will be customized based on patient's performance and goals. 30 Day review completed. Medical Director ITP review done, changes made as directed, and signed approval by Medical Director.          Comments:

## 2019-12-22 ENCOUNTER — Other Ambulatory Visit: Payer: Self-pay

## 2019-12-22 ENCOUNTER — Encounter: Payer: 59 | Admitting: *Deleted

## 2019-12-22 DIAGNOSIS — I25118 Atherosclerotic heart disease of native coronary artery with other forms of angina pectoris: Secondary | ICD-10-CM

## 2019-12-22 NOTE — Progress Notes (Signed)
Daily Session Note  Patient Details  Name: Josealberto Montalto. MRN: [id number] Date of Birth: 08-13-1955 Referring Provider:     Cardiac Rehab from 11/21/2019 in [facility] Cardiac and Pulmonary Rehab  Referring Provider Adrian Prows MD      Encounter Date: 12/22/2019  Check In:  Session Check In - 12/22/19 1340      Check-In   Supervising physician immediately available to respond to emergencies See telemetry face sheet for immediately available ER MD    Location [facility]-Cardiac & Pulmonary Rehab    Staff Present Renita Papa, RN BSN;Joseph Hood RCP,RRT,BSRT;Laureen [place], [place], RRT, CPFT    Virtual Visit No    Medication changes reported     No    Fall or balance concerns reported    No    Warm-up and Cool-down Performed on first and last piece of equipment    Resistance Training Performed Yes    VAD Patient? No    PAD/SET Patient? No      Pain Assessment   Currently in Pain? No/denies              Social History   Tobacco Use  Smoking Status Never Smoker  Smokeless Tobacco Never Used    Goals Met:  Independence with exercise equipment Exercise tolerated well No report of cardiac concerns or symptoms Strength training completed today  Goals Unmet:  Not Applicable  Comments: Pt able to follow exercise prescription today without complaint.  Will continue to monitor for progression.    Dr. Emily Filbert is Medical Director for [place] and [facility].

## 2019-12-26 ENCOUNTER — Other Ambulatory Visit: Payer: Self-pay

## 2019-12-26 ENCOUNTER — Encounter: Payer: 59 | Admitting: *Deleted

## 2019-12-26 DIAGNOSIS — I25118 Atherosclerotic heart disease of native coronary artery with other forms of angina pectoris: Secondary | ICD-10-CM | POA: Diagnosis not present

## 2019-12-26 NOTE — Progress Notes (Signed)
Daily Session Note  Patient Details  Name: Travis Fletcher. MRN: [id number] Date of Birth: Jun 08, 1956 Referring Provider:     Cardiac Rehab from 11/21/2019 in [facility] Cardiac and Pulmonary Rehab  Referring Provider Adrian Prows MD      Encounter Date: 12/26/2019  Check In:  Session Check In - 12/26/19 1336      Check-In   Supervising physician immediately available to respond to emergencies See telemetry face sheet for immediately available ER MD    Location [facility]-Cardiac & Pulmonary Rehab    Staff Present Renita Papa, RN Moises Blood, BS, ACSM CEP, Exercise Physiologist;Kara Eliezer Bottom, MS Exercise Physiologist    Virtual Visit No    Medication changes reported     No    Warm-up and Cool-down Performed on first and last piece of equipment    Resistance Training Performed Yes    VAD Patient? No    PAD/SET Patient? No      Pain Assessment   Currently in Pain? No/denies              Social History   Tobacco Use  Smoking Status Never Smoker  Smokeless Tobacco Never Used    Goals Met:  Independence with exercise equipment Exercise tolerated well No report of cardiac concerns or symptoms Strength training completed today  Goals Unmet:  Not Applicable  Comments: Pt able to follow exercise prescription today without complaint.  Will continue to monitor for progression.    Dr. Emily Filbert is Medical Director for [place] and [facility].

## 2019-12-28 ENCOUNTER — Other Ambulatory Visit: Payer: Self-pay

## 2019-12-28 ENCOUNTER — Encounter: Payer: 59 | Admitting: *Deleted

## 2019-12-28 DIAGNOSIS — I25118 Atherosclerotic heart disease of native coronary artery with other forms of angina pectoris: Secondary | ICD-10-CM

## 2019-12-28 NOTE — Progress Notes (Signed)
Daily Session Note  Patient Details  Name: Travis Fletcher. MRN: [id number] Date of Birth: 04/25/1956 Referring Provider:     Cardiac Rehab from 11/21/2019 in [facility] Cardiac and Pulmonary Rehab  Referring Provider Adrian Prows MD      Encounter Date: 12/28/2019  Check In:  Session Check In - 12/28/19 1350      Check-In   Supervising physician immediately available to respond to emergencies See telemetry face sheet for immediately available ER MD    Location [facility]-Cardiac & Pulmonary Rehab    Staff Present Renita Papa, RN Margurite Auerbach, MS Exercise Physiologist;Amanda Oletta Darter, BA, ACSM CEP, Exercise Physiologist    Virtual Visit No    Medication changes reported     No    Fall or balance concerns reported    No    Warm-up and Cool-down Performed on first and last piece of equipment    Resistance Training Performed Yes    VAD Patient? No    PAD/SET Patient? No      Pain Assessment   Currently in Pain? No/denies              Social History   Tobacco Use  Smoking Status Never Smoker  Smokeless Tobacco Never Used    Goals Met:  Independence with exercise equipment Exercise tolerated well No report of cardiac concerns or symptoms Strength training completed today  Goals Unmet:  Not Applicable  Comments: Pt able to follow exercise prescription today without complaint.  Will continue to monitor for progression.    Dr. Emily Filbert is Medical Director for [place] and [facility].

## 2019-12-29 ENCOUNTER — Other Ambulatory Visit: Payer: Self-pay

## 2019-12-29 ENCOUNTER — Encounter: Payer: 59 | Admitting: *Deleted

## 2019-12-29 DIAGNOSIS — I25118 Atherosclerotic heart disease of native coronary artery with other forms of angina pectoris: Secondary | ICD-10-CM

## 2019-12-29 NOTE — Progress Notes (Signed)
Daily Session Note  Patient Details  Name: Travis Fletcher. MRN: [id number] Date of Birth: 14-Jul-1955 Referring Provider:     Cardiac Rehab from 11/21/2019 in [facility] Cardiac and Pulmonary Rehab  Referring Provider Adrian Prows MD      Encounter Date: 12/29/2019  Check In:  Session Check In - 12/29/19 1340      Check-In   Supervising physician immediately available to respond to emergencies See telemetry face sheet for immediately available ER MD    Location [facility]-Cardiac & Pulmonary Rehab    Staff Present Renita Papa, RN Margurite Auerbach, MS Exercise Physiologist;Amanda Oletta Darter, BA, ACSM CEP, Exercise Physiologist;Joseph Tessie Fass RCP,RRT,BSRT    Virtual Visit No    Medication changes reported     No    Fall or balance concerns reported    No    Warm-up and Cool-down Performed on first and last piece of equipment    Resistance Training Performed Yes    VAD Patient? No    PAD/SET Patient? No      Pain Assessment   Currently in Pain? No/denies              Social History   Tobacco Use  Smoking Status Never Smoker  Smokeless Tobacco Never Used    Goals Met:  Independence with exercise equipment Exercise tolerated well No report of cardiac concerns or symptoms Strength training completed today  Goals Unmet:  Not Applicable  Comments: Pt able to follow exercise prescription today without complaint.  Will continue to monitor for progression.    Dr. Emily Filbert is Medical Director for [place] and [facility].

## 2020-01-04 ENCOUNTER — Encounter: Payer: 59 | Admitting: *Deleted

## 2020-01-04 ENCOUNTER — Other Ambulatory Visit: Payer: Self-pay

## 2020-01-04 DIAGNOSIS — I25118 Atherosclerotic heart disease of native coronary artery with other forms of angina pectoris: Secondary | ICD-10-CM

## 2020-01-04 NOTE — Progress Notes (Signed)
Daily Session Note  Patient Details  Name: Travis Fletcher. MRN: [id number] Date of Birth: 1955/12/10 Referring Provider:     Cardiac Rehab from 11/21/2019 in [facility] Cardiac and Pulmonary Rehab  Referring Provider Adrian Prows MD      Encounter Date: 01/04/2020  Check In:  Session Check In - 01/04/20 1328      Check-In   Supervising physician immediately available to respond to emergencies See telemetry face sheet for immediately available ER MD    Location [facility]-Cardiac & Pulmonary Rehab    Staff Present Renita Papa, RN BSN;Jessica Luan Pulling, MA, RCEP, CCRP, CCET;Joseph Marvel RCP,RRT,BSRT    Virtual Visit No    Medication changes reported     No    Fall or balance concerns reported    No    Warm-up and Cool-down Performed on first and last piece of equipment    Resistance Training Performed Yes    VAD Patient? No    PAD/SET Patient? No      Pain Assessment   Currently in Pain? No/denies              Social History   Tobacco Use  Smoking Status Never Smoker  Smokeless Tobacco Never Used    Goals Met:  Independence with exercise equipment Exercise tolerated well No report of cardiac concerns or symptoms Strength training completed today  Goals Unmet:  Not Applicable  Comments: Pt able to follow exercise prescription today without complaint.  Will continue to monitor for progression.  Reviewed home exercise with pt today.  Pt plans to walk and use treadmill at home for exercise.  Reviewed THR, pulse, RPE, sign and symptoms, pulse oximetery and when to call 911 or MD.  Also discussed weather considerations and indoor options.  Pt voiced understanding.   Dr. Emily Filbert is Medical Director for [place] and [facility].

## 2020-01-05 ENCOUNTER — Other Ambulatory Visit: Payer: Self-pay

## 2020-01-05 ENCOUNTER — Encounter: Payer: 59 | Admitting: *Deleted

## 2020-01-05 DIAGNOSIS — I25118 Atherosclerotic heart disease of native coronary artery with other forms of angina pectoris: Secondary | ICD-10-CM

## 2020-01-05 NOTE — Progress Notes (Signed)
Daily Session Note  Patient Details  Name: Travis Fletcher. MRN: [id number] Date of Birth: Jul 04, 1955 Referring Provider:     Cardiac Rehab from 11/21/2019 in [facility] Cardiac and Pulmonary Rehab  Referring Provider Adrian Prows MD      Encounter Date: 01/05/2020  Check In:  Session Check In - 01/05/20 1328      Check-In   Supervising physician immediately available to respond to emergencies See telemetry face sheet for immediately available ER MD    Location [facility]-Cardiac & Pulmonary Rehab    Staff Present Renita Papa, RN BSN;Joseph [address] [place], [place], [place], CCRP, CCET    Virtual Visit No    Medication changes reported     No    Fall or balance concerns reported    No    Warm-up and Cool-down Performed on first and last piece of equipment    Resistance Training Performed Yes    VAD Patient? No    PAD/SET Patient? No      Pain Assessment   Currently in Pain? No/denies              Social History   Tobacco Use  Smoking Status Never Smoker  Smokeless Tobacco Never Used    Goals Met:  Independence with exercise equipment Exercise tolerated well No report of cardiac concerns or symptoms Strength training completed today  Goals Unmet:  Not Applicable  Comments: Pt able to follow exercise prescription today without complaint.  Will continue to monitor for progression.    Dr. Emily Filbert is Medical Director for [place] and [facility].

## 2020-01-09 ENCOUNTER — Encounter: Payer: 59 | Attending: Cardiology | Admitting: *Deleted

## 2020-01-09 ENCOUNTER — Other Ambulatory Visit: Payer: Self-pay

## 2020-01-09 DIAGNOSIS — Z79899 Other long term (current) drug therapy: Secondary | ICD-10-CM | POA: Insufficient documentation

## 2020-01-09 DIAGNOSIS — E1140 Type 2 diabetes mellitus with diabetic neuropathy, unspecified: Secondary | ICD-10-CM | POA: Insufficient documentation

## 2020-01-09 DIAGNOSIS — R0600 Dyspnea, unspecified: Secondary | ICD-10-CM | POA: Insufficient documentation

## 2020-01-09 DIAGNOSIS — Z794 Long term (current) use of insulin: Secondary | ICD-10-CM | POA: Diagnosis not present

## 2020-01-09 DIAGNOSIS — Z7982 Long term (current) use of aspirin: Secondary | ICD-10-CM | POA: Insufficient documentation

## 2020-01-09 DIAGNOSIS — Z9641 Presence of insulin pump (external) (internal): Secondary | ICD-10-CM | POA: Insufficient documentation

## 2020-01-09 DIAGNOSIS — Z7902 Long term (current) use of antithrombotics/antiplatelets: Secondary | ICD-10-CM | POA: Diagnosis not present

## 2020-01-09 DIAGNOSIS — E7800 Pure hypercholesterolemia, unspecified: Secondary | ICD-10-CM | POA: Diagnosis not present

## 2020-01-09 DIAGNOSIS — Z87442 Personal history of urinary calculi: Secondary | ICD-10-CM | POA: Insufficient documentation

## 2020-01-09 DIAGNOSIS — Z8546 Personal history of malignant neoplasm of prostate: Secondary | ICD-10-CM | POA: Insufficient documentation

## 2020-01-09 DIAGNOSIS — M1990 Unspecified osteoarthritis, unspecified site: Secondary | ICD-10-CM | POA: Insufficient documentation

## 2020-01-09 DIAGNOSIS — I25118 Atherosclerotic heart disease of native coronary artery with other forms of angina pectoris: Secondary | ICD-10-CM | POA: Diagnosis present

## 2020-01-09 NOTE — Progress Notes (Signed)
Daily Session Note  Patient Details  Name: Travis Fletcher. MRN: [id number] Date of Birth: Jul 27, 1955 Referring Provider:     Cardiac Rehab from 11/21/2019 in [facility] Cardiac and Pulmonary Rehab  Referring Provider Adrian Prows MD      Encounter Date: 01/09/2020  Check In:  Session Check In - 01/09/20 1337      Check-In   Supervising physician immediately available to respond to emergencies See telemetry face sheet for immediately available ER MD    Location [facility]-Cardiac & Pulmonary Rehab    Staff Present Renita Papa, RN Margurite Auerbach, MS Exercise Physiologist;Kelly Amedeo Plenty, BS, ACSM CEP, Exercise Physiologist    Virtual Visit No    Medication changes reported     No    Fall or balance concerns reported    No    Warm-up and Cool-down Performed on first and last piece of equipment    Resistance Training Performed Yes    VAD Patient? No    PAD/SET Patient? No      Pain Assessment   Currently in Pain? No/denies              Social History   Tobacco Use  Smoking Status Never Smoker  Smokeless Tobacco Never Used    Goals Met:  Independence with exercise equipment Exercise tolerated well No report of cardiac concerns or symptoms Strength training completed today  Goals Unmet:  Not Applicable  Comments: Pt able to follow exercise prescription today without complaint.  Will continue to monitor for progression.    Dr. Emily Filbert is Medical Director for [place] and [facility].

## 2020-01-12 ENCOUNTER — Encounter: Payer: 59 | Admitting: *Deleted

## 2020-01-12 ENCOUNTER — Other Ambulatory Visit: Payer: Self-pay

## 2020-01-12 DIAGNOSIS — I25118 Atherosclerotic heart disease of native coronary artery with other forms of angina pectoris: Secondary | ICD-10-CM

## 2020-01-12 NOTE — Progress Notes (Signed)
Daily Session Note  Patient Details  Name: Travis Fletcher. MRN: [id number] Date of Birth: 1955-08-09 Referring Provider:     Cardiac Rehab from 11/21/2019 in [facility] Cardiac and Pulmonary Rehab  Referring Provider Adrian Prows MD      Encounter Date: 01/12/2020  Check In:  Session Check In - 01/12/20 1331      Check-In   Supervising physician immediately available to respond to emergencies See telemetry face sheet for immediately available ER MD    Location [facility]-Cardiac & Pulmonary Rehab    Staff Present Renita Papa, RN BSN;Joseph Hood RCP,RRT,BSRT;Laureen [place], [place], RRT, CPFT    Virtual Visit No    Medication changes reported     No    Fall or balance concerns reported    No    Warm-up and Cool-down Performed on first and last piece of equipment    Resistance Training Performed Yes    VAD Patient? No    PAD/SET Patient? No      Pain Assessment   Currently in Pain? No/denies              Social History   Tobacco Use  Smoking Status Never Smoker  Smokeless Tobacco Never Used    Goals Met:  Independence with exercise equipment Exercise tolerated well No report of cardiac concerns or symptoms Strength training completed today  Goals Unmet:  Not Applicable  Comments: Pt able to follow exercise prescription today without complaint.  Will continue to monitor for progression.    Dr. Emily Filbert is Medical Director for [place] and [facility].

## 2020-01-17 ENCOUNTER — Telehealth: Payer: Self-pay

## 2020-01-17 ENCOUNTER — Ambulatory Visit
Admission: EM | Admit: 2020-01-17 | Discharge: 2020-01-17 | Disposition: A | Discharge status: Home / Self Care | Payer: 59 | Attending: Emergency Medicine | Admitting: Emergency Medicine

## 2020-01-17 DIAGNOSIS — M5442 Lumbago with sciatica, left side: Secondary | ICD-10-CM | POA: Diagnosis not present

## 2020-01-17 MED ORDER — IBUPROFEN 600 MG PO TABS
600.0000 mg | ORAL_TABLET | Freq: Four times a day (QID) | ORAL | 0 refills | Status: DC | PRN
Start: 2020-01-17 — End: 2021-07-09

## 2020-01-17 MED ORDER — CYCLOBENZAPRINE HCL 10 MG PO TABS
10.0000 mg | ORAL_TABLET | Freq: Two times a day (BID) | ORAL | 0 refills | Status: DC | PRN
Start: 2020-01-17 — End: 2020-07-10

## 2020-01-17 NOTE — ED Triage Notes (Signed)
Pt presents to UC for lower back pain x3 weeks. Pt states back pain was improving until yesterday when he hung a light fixture and did some stretching. Pt states back has been "tight" ever since. Pt states pain in mainly in left lower back wrapping around to left hip.   Pt has been treating with ibuprofen and an old flexeril prescription.

## 2020-01-17 NOTE — Telephone Encounter (Signed)
Patient called, reports that he injured his back at home. He saw a provider today who informed him to take muscle relaxer's and rest the remaining of the week. Patient is not able to come to [facility] this week and plans to call next Monday to let us know how he is feeling and if able to attend rehab.

## 2020-01-17 NOTE — Discharge Instructions (Signed)
Take the prescribed ibuprofen as needed for your pain.  Take the muscle relaxer Flexeril as needed for muscle spasm; Do not drive, operate machinery, or drink alcohol with this medication as it may make you drowsy.    Follow up with your primary care provider or an orthopedist if your pain is not improving.     

## 2020-01-17 NOTE — ED Provider Notes (Signed)
Travis Fletcher    CSN: [id number] Arrival date & time: 01/17/20  1146      History   Chief Complaint Chief Complaint  Patient presents with  . Back Pain    HPI Travis Fletcher. is a 64 y.o. male.   Patient presents with left lower back pain x3 weeks.  It started when he was vacuuming but then got much worse yesterday after he was hanging a light fixture.  He states the pain is radiating to his left thigh; worse with walking; improves with rest.  He denies numbness, weakness, bowel/bladder incontinence, or other symptoms.  Treatment attempted at home with ibuprofen and one Flexeril which was leftover from a previous prescription.  His medical history includes diabetes, CAD, arthritis, prostate CA, angina.   The history is provided by the patient.    Past Medical History:  Diagnosis Date  . Anginal pain ([place])   . Arthritis    "hands" (07/11/2014)  . CAD (coronary artery disease), native coronary artery 07/24/2018  . Coronary artery disease   . High cholesterol   . Hypercholesteremia 07/24/2018  . Insulin dependent diabetes mellitus   . Kidney stones    "passed them all"  . Neuromuscular disorder ([facility])    neuropathy  . Prostate cancer ([place]) 2010  . Statin intolerance 07/24/2018  . Uncontrolled diabetes mellitus ([place]) 07/24/2018    Patient Active Problem List   Diagnosis Date Noted  . Angina pectoris ([place])   . CAD (coronary artery disease), native coronary artery 07/24/2018  . Type I diabetes mellitus, uncontrolled ([place]) 07/24/2018  . Hypercholesteremia 07/24/2018  . Statin intolerance 07/24/2018  . Dyspnea 01/29/2018  . Snoring 01/29/2018  . Post PTCA 07/11/2014  . Mild non proliferative diabetic retinopathy ([place]) 11/28/2012    Past Surgical History:  Procedure Laterality Date  . BALLOON ANGIOPLASTY, ARTERY  07/13/2012  . CARDIAC CATHETERIZATION N/A 06/15/2015   Procedure: Left Heart Cath and Coronary Angiography;  Surgeon: Adrian Prows, MD;  Location:  [facility];  Service: Cardiovascular;  Laterality: N/A;  . CARDIAC CATHETERIZATION N/A 06/15/2015   Procedure: Coronary Stent Intervention;  Surgeon: Adrian Prows, MD;  Location: [facility];  Service: Cardiovascular;  Laterality: N/A;  . CATARACT EXTRACTION W/ INTRAOCULAR LENS  IMPLANT, BILATERAL Bilateral ~ 2000  . CORONARY ANGIOPLASTY  07/11/2014  . CORONARY ANGIOPLASTY WITH STENT PLACEMENT  2012   "1"  . EYE SURGERY    . INGUINAL HERNIA REPAIR Left 1987  . INSERTION PROSTATE RADIATION SEED  2010  . LEFT HEART CATH AND CORONARY ANGIOGRAPHY N/A 06/26/2017   Procedure: LEFT HEART CATH AND CORONARY ANGIOGRAPHY;  Surgeon: Adrian Prows, MD;  Location: [facility];  Service: Cardiovascular;  Laterality: N/A;  . LEFT HEART CATH AND CORONARY ANGIOGRAPHY N/A 08/30/2019   Procedure: LEFT HEART CATH AND CORONARY ANGIOGRAPHY;  Surgeon: Adrian Prows, MD;  Location: [facility];  Service: Cardiovascular;  Laterality: N/A;  . LEFT HEART CATHETERIZATION WITH CORONARY ANGIOGRAM N/A 07/13/2012   Procedure: LEFT HEART CATHETERIZATION WITH CORONARY ANGIOGRAM;  Surgeon: Laverda Page, MD;  Location: [facility];  Service: Cardiovascular;  Laterality: N/A;  . LEFT HEART CATHETERIZATION WITH CORONARY ANGIOGRAM N/A 07/11/2014   Procedure: LEFT HEART CATHETERIZATION WITH CORONARY ANGIOGRAM;  Surgeon: Laverda Page, MD;  Location: [facility];  Service: Cardiovascular;  Laterality: N/A;  . RETINAL DETACHMENT SURGERY Left 2010  . RETINAL LASER PROCEDURE Right 2010  . THYROIDECTOMY, PARTIAL  ~ 1979  .  TONSILLECTOMY AND ADENOIDECTOMY  1977       Home Medications    Prior to Admission medications   Medication Sig Start Date End Date Taking? Authorizing Provider  acetaminophen (TYLENOL) 325 MG tablet Take 650 mg by mouth every 6 (six) hours as needed for moderate pain or headache.    [provider]  albuterol (VENTOLIN HFA) 108 (90 Base) MCG/ACT inhaler Inhale 1 puff into the lungs  every 6 (six) hours as needed for wheezing or shortness of breath.  07/05/19   [provider]  amLODipine (NORVASC) 5 MG tablet TAKE 1 TABLET BY MOUTH ONCE A DAY 10/12/19   Adrian Prows, MD  aspirin 81 MG tablet Take 1 tablet (81 mg total) by mouth daily. 06/15/15   Adrian Prows, MD  clopidogrel (PLAVIX) 75 MG tablet TAKE 1 TABLET BY MOUTH ONCE DAILY Patient taking differently: Take 75 mg by mouth daily.  08/12/19   Adrian Prows, MD  cyclobenzaprine (FLEXERIL) 10 MG tablet Take 1 tablet (10 mg total) by mouth 2 (two) times daily as needed for muscle spasms. 01/17/20   Sharion Balloon, NP  guaiFENesin (MUCINEX) 600 MG 12 hr tablet Take 600 mg by mouth 2 (two) times daily as needed (congestion).    [provider]  ibuprofen (ADVIL) 600 MG tablet Take 1 tablet (600 mg total) by mouth every 6 (six) hours as needed. 01/17/20   Sharion Balloon, NP  insulin aspart (NOVOLOG) 100 UNIT/ML injection Inject into the skin See admin instructions. Uses via Insulin Pump    [provider]  losartan (COZAAR) 25 MG tablet TAKE HALF A TABLET BY MOUTH DAILY Patient taking differently: Take 12.5 mg by mouth daily.  08/12/19   Adrian Prows, MD  MELATONIN PO Take 1 tablet by mouth at bedtime as needed (sleep).    [provider]  metoprolol tartrate (LOPRESSOR) 25 MG tablet TAKE 1/2 TABLET BY MOUTH TWICE A DAY AS NEEDED Patient taking differently: Take 12.5 mg by mouth 2 (two) times daily.  06/21/19   Adrian Prows, MD  Multiple Vitamins-Minerals (CENTRUM SILVER 50+MEN PO) Take 1 tablet by mouth daily.    [provider]  nitroGLYCERIN (NITROSTAT) 0.4 MG SL tablet Place 1 tablet (0.4 mg total) under the tongue every 5 (five) minutes as needed for chest pain. 07/26/19   Miquel Dunn, NP  ranolazine (RANEXA) 500 MG 12 hr tablet Take 1 tablet (500 mg total) by mouth 2 (two) times daily. 07/18/19   Miquel Dunn, NP    Family History Family History  Problem Relation Age of Onset  .  Stroke Mother   . Atrial fibrillation Mother   . Atrial fibrillation Father   . Heart disease Sister     Social History Social History   Tobacco Use  . Smoking status: Never Smoker  . Smokeless tobacco: Never Used  Vaping Use  . Vaping Use: Never used  Substance Use Topics  . Alcohol use: Yes    Comment: 2-3 beers per week  . Drug use: No     Allergies   Brilinta [ticagrelor] and Statins   Review of Systems Review of Systems  Constitutional: Negative for chills and fever.  HENT: Negative for ear pain and sore throat.   Eyes: Negative for pain and visual disturbance.  Respiratory: Negative for cough and shortness of breath.   Cardiovascular: Negative for chest pain and palpitations.  Gastrointestinal: Negative for abdominal pain and vomiting.  Genitourinary: Negative for dysuria and hematuria.  Musculoskeletal: Positive for back pain. Negative for arthralgias.  Skin: Negative for color change and rash.  Neurological: Negative for seizures, syncope, weakness and numbness.  All other systems reviewed and are negative.    Physical Exam Triage Vital Signs ED Triage Vitals  Enc Vitals Group     BP 01/17/20 1153 122/71     Pulse Rate 01/17/20 1153 88     Resp 01/17/20 1153 16     Temp 01/17/20 1153 98.7 F (37.1 C)     Temp Source 01/17/20 1153 Oral     SpO2 01/17/20 1153 96 %     Weight --      Height --      Head Circumference --      Peak Flow --      Pain Score 01/17/20 1156 0     Pain Loc --      Pain Edu? --      Excl. in [place]? --    No data found.  Updated Vital Signs BP 122/71   Pulse 88   Temp 98.7 F (37.1 C) (Oral)   Resp 16   SpO2 96%   Visual Acuity Right Eye Distance:   Left Eye Distance:   Bilateral Distance:    Right Eye Near:   Left Eye Near:    Bilateral Near:     Physical Exam Vitals and nursing note reviewed.  Constitutional:      General: He is not in acute distress.    Appearance: He is well-developed.  HENT:     Head:  Normocephalic and atraumatic.     Mouth/Throat:     Mouth: Mucous membranes are moist.  Eyes:     Conjunctiva/sclera: Conjunctivae normal.  Cardiovascular:     Rate and Rhythm: Normal rate and regular rhythm.     Heart sounds: No murmur heard.   Pulmonary:     Effort: Pulmonary effort is normal. No respiratory distress.     Breath sounds: Normal breath sounds.  Abdominal:     Palpations: Abdomen is soft.     Tenderness: There is no abdominal tenderness. There is no right CVA tenderness, left CVA tenderness, guarding or rebound.  Musculoskeletal:        General: No swelling, tenderness, deformity or signs of injury. Normal range of motion.     Cervical back: Neck supple.  Skin:    General: Skin is warm and dry.     Findings: No bruising, erythema, lesion or rash.  Neurological:     General: No focal deficit present.     Mental Status: He is alert and oriented to person, place, and time.     Sensory: No sensory deficit.     Motor: No weakness.     Gait: Gait normal.  Psychiatric:        Mood and Affect: Mood normal.        Behavior: Behavior normal.      UC Treatments / Results  Labs (all labs ordered are listed, but only abnormal results are displayed) Labs Reviewed - No data to display  EKG   Radiology No results found.  Procedures Procedures (including critical care time)  Medications Ordered in UC Medications - No data to display  Initial Impression / Assessment and Plan / UC Course  I have reviewed the triage vital signs and the nursing notes.  Pertinent labs & imaging results that were available during my care of the patient were reviewed by me and considered in my medical  decision making (see chart for details).   Left lower back pain with left-sided sciatica.  Treating with ibuprofen and Flexeril.  Precautions for drowsiness with Flexeril discussed with patient.  Instructed him to follow-up with his PCP or an orthopedist if his symptoms are not improving.   Patient agrees to plan of care.     Final Clinical Impressions(s) / UC Diagnoses   Final diagnoses:  Acute left-sided low back pain with left-sided sciatica     Discharge Instructions     Take the prescribed ibuprofen as needed for your pain.  Take the muscle relaxer Flexeril as needed for muscle spasm; Do not drive, operate machinery, or drink alcohol with this medication as it may make you drowsy.    Follow up with your primary care provider or an orthopedist if your pain is not improving.        ED Prescriptions    Medication Sig Dispense Auth. Provider   ibuprofen (ADVIL) 600 MG tablet Take 1 tablet (600 mg total) by mouth every 6 (six) hours as needed. 30 tablet Sharion Balloon, NP   cyclobenzaprine (FLEXERIL) 10 MG tablet Take 1 tablet (10 mg total) by mouth 2 (two) times daily as needed for muscle spasms. 20 tablet Sharion Balloon, NP     I have reviewed the PDMP during this encounter.   Sharion Balloon, NP 01/17/20 [phone]

## 2020-01-18 ENCOUNTER — Encounter: Payer: Self-pay | Admitting: *Deleted

## 2020-01-18 DIAGNOSIS — I25118 Atherosclerotic heart disease of native coronary artery with other forms of angina pectoris: Secondary | ICD-10-CM

## 2020-01-18 NOTE — Progress Notes (Signed)
Cardiac Individual Treatment Plan  Patient Details  Name: Garet Hooton. MRN: [id number] Date of Birth: [birth year] Referring Provider:     Cardiac Rehab from 11/21/2019 in [facility] Cardiac and Pulmonary Rehab  Referring Provider Adrian Prows MD      Initial Encounter Date:    Cardiac Rehab from 11/21/2019 in [facility] Cardiac and Pulmonary Rehab  Date 11/21/19      Visit Diagnosis: Coronary artery disease of native artery of native heart with stable angina pectoris ([place])  Patient's Home Medications on Admission:  Current Outpatient Medications:  .  acetaminophen (TYLENOL) 325 MG tablet, Take 650 mg by mouth every 6 (six) hours as needed for moderate pain or headache., Disp: , Rfl:  .  albuterol (VENTOLIN HFA) 108 (90 Base) MCG/ACT inhaler, Inhale 1 puff into the lungs every 6 (six) hours as needed for wheezing or shortness of breath. , Disp: , Rfl:  .  amLODipine (NORVASC) 5 MG tablet, TAKE 1 TABLET BY MOUTH ONCE A DAY, Disp: 30 tablet, Rfl: 6 .  aspirin 81 MG tablet, Take 1 tablet (81 mg total) by mouth daily., Disp: , Rfl:  .  clopidogrel (PLAVIX) 75 MG tablet, TAKE 1 TABLET BY MOUTH ONCE DAILY (Patient taking differently: Take 75 mg by mouth daily. ), Disp: 30 tablet, Rfl: 3 .  cyclobenzaprine (FLEXERIL) 10 MG tablet, Take 1 tablet (10 mg total) by mouth 2 (two) times daily as needed for muscle spasms., Disp: 20 tablet, Rfl: 0 .  guaiFENesin (MUCINEX) 600 MG 12 hr tablet, Take 600 mg by mouth 2 (two) times daily as needed (congestion)., Disp: , Rfl:  .  ibuprofen (ADVIL) 600 MG tablet, Take 1 tablet (600 mg total) by mouth every 6 (six) hours as needed., Disp: 30 tablet, Rfl: 0 .  insulin aspart (NOVOLOG) 100 UNIT/ML injection, Inject into the skin See admin instructions. Uses via Insulin Pump, Disp: , Rfl:  .  losartan (COZAAR) 25 MG tablet, TAKE HALF A TABLET BY MOUTH DAILY (Patient taking differently: Take 12.5 mg by mouth daily. ), Disp: 90 tablet, Rfl: 0 .  MELATONIN PO, Take 1  tablet by mouth at bedtime as needed (sleep)., Disp: , Rfl:  .  metoprolol tartrate (LOPRESSOR) 25 MG tablet, TAKE 1/2 TABLET BY MOUTH TWICE A DAY AS NEEDED (Patient taking differently: Take 12.5 mg by mouth 2 (two) times daily. ), Disp: 180 tablet, Rfl: 0 .  Multiple Vitamins-Minerals (CENTRUM SILVER 50+MEN PO), Take 1 tablet by mouth daily., Disp: , Rfl:  .  nitroGLYCERIN (NITROSTAT) 0.4 MG SL tablet, Place 1 tablet (0.4 mg total) under the tongue every 5 (five) minutes as needed for chest pain., Disp: 30 tablet, Rfl: 6 .  ranolazine (RANEXA) 500 MG 12 hr tablet, Take 1 tablet (500 mg total) by mouth 2 (two) times daily., Disp: 60 tablet, Rfl: 1  Past Medical History: Past Medical History:  Diagnosis Date  . Anginal pain ([place])   . Arthritis    "hands" (07/11/2014)  . CAD (coronary artery disease), native coronary artery 07/24/2018  . Coronary artery disease   . High cholesterol   . Hypercholesteremia 07/24/2018  . Insulin dependent diabetes mellitus   . Kidney stones    "passed them all"  . Neuromuscular disorder ([facility])    neuropathy  . Prostate cancer ([place]) 2010  . Statin intolerance 07/24/2018  . Uncontrolled diabetes mellitus ([place]) 07/24/2018    Tobacco Use: Social History   Tobacco Use  Smoking Status Never Smoker  Smokeless Tobacco Never  Used    Labs: Recent Chemical engineer    Labs for ITP Cardiac and Pulmonary Rehab Latest Ref Rng & Units 02/25/2008 01/04/2011 01/05/2011 06/15/2015 11/25/2019   Cholestrol 100 - 199 mg/dL - - 161 131 273(H)   LDLCALC 0 - 99 mg/dL - - 87 61 195(H)   HDL >39 mg/dL - - 51 55 55   Trlycerides 0 - 149 mg/dL - - 114 76 127   Hemoglobin A1c <5.7 % - 8.0(H) - - -   TCO2 - 28 - - - -       Exercise Target Goals: Exercise Program Goal: Individual exercise prescription set using results from initial 6 min walk test and THRR while considering  patient's activity barriers and safety.   Exercise Prescription Goal: Initial exercise prescription  builds to 30-45 minutes a day of aerobic activity, 2-3 days per week.  Home exercise guidelines will be given to patient during program as part of exercise prescription that the participant will acknowledge.   Education: Aerobic Exercise & Resistance Training: - Gives group verbal and written instruction on the various components of exercise. Focuses on aerobic and resistive training programs and the benefits of this training and how to safely progress through these programs..   Education: Exercise & Equipment Safety: - Individual verbal instruction and demonstration of equipment use and safety with use of the equipment.   Cardiac Rehab from 01/04/2020 in [facility] Cardiac and Pulmonary Rehab  Date 11/16/19  Educator [facility]  Instruction Review Code 1- Verbalizes Understanding      Education: Exercise Physiology & General Exercise Guidelines: - Group verbal and written instruction with models to review the exercise physiology of the cardiovascular system and associated critical values. Provides general exercise guidelines with specific guidelines to those with heart or lung disease.    Cardiac Rehab from 01/04/2020 in [facility] Cardiac and Pulmonary Rehab  Date 12/21/19  Educator [facility]  Instruction Review Code 1- Verbalizes Understanding      Education: Flexibility, Balance, Mind/Body Relaxation: Provides group verbal/written instruction on the benefits of flexibility and balance training, including mind/body exercise modes such as yoga, pilates and tai chi.  Demonstration and skill practice provided.   Activity Barriers & Risk Stratification:  Activity Barriers & Cardiac Risk Stratification - 11/21/19 0916      Activity Barriers & Cardiac Risk Stratification   Activity Barriers Joint Problems;Deconditioning;Muscular Weakness;Chest Pain/Angina   shoulder problems   Cardiac Risk Stratification Moderate           6 Minute Walk:  6 Minute Walk    Row Name 11/21/19 0916         6 Minute Walk    Phase Initial     Distance 1210 feet     Walk Time 6 minutes     # of Rest Breaks 0     MPH 2.29     METS 3.23     RPE 7     VO2 Peak 11.28     Symptoms Yes (comment)     Comments angina 2/10     Resting HR 80 bpm     Resting BP 122/70     Resting Oxygen Saturation  97 %     Exercise Oxygen Saturation  during 6 min walk 96 %     Max Ex. HR 105 bpm     Max Ex. BP 128/64     2 Minute Post BP 122/74            Oxygen  Initial Assessment:   Oxygen Re-Evaluation:   Oxygen Discharge (Final Oxygen Re-Evaluation):   Initial Exercise Prescription:  Initial Exercise Prescription - 11/21/19 0900      Date of Initial Exercise RX and Referring Provider   Date 11/21/19    Referring Provider Adrian Prows MD      Treadmill   MPH 2.2    Grade 1.5    Minutes 15    METs 3.14      NuStep   Level 3    SPM 80    Minutes 15    METs 3      REL-XR   Level 3    Speed 50    Minutes 15    METs 3      Biostep-RELP   Level 3    SPM 50    Minutes 15    METs 3      Prescription Details   Frequency (times per week) 3    Duration Progress to 30 minutes of continuous aerobic without signs/symptoms of physical distress      Intensity   THRR 40-80% of Max Heartrate 112-143    Ratings of Perceived Exertion 11-13    Perceived Dyspnea 0-4      Progression   Progression Continue to progress workloads to maintain intensity without signs/symptoms of physical distress.      Resistance Training   Training Prescription Yes    Weight 4 lb    Reps 10-15           Perform Capillary Blood Glucose checks as needed.  Exercise Prescription Changes:  Exercise Prescription Changes    Row Name 11/21/19 0900 11/28/19 1600 12/13/19 1300 12/26/19 1500 01/04/20 1300     Response to Exercise   Blood Pressure (Admit) 122/70 112/54 130/62 118/60 --   Blood Pressure (Exercise) 128/64 162/64 122/64 150/56 --   Blood Pressure (Exit) 122/74 124/64 124/66 106/58 --   Heart Rate (Admit) 80 bpm  85 bpm 81 bpm 91 bpm --   Heart Rate (Exercise) 105 bpm 116 bpm 122 bpm 137 bpm --   Heart Rate (Exit) 77 bpm 92 bpm 96 bpm 109 bpm --   Oxygen Saturation (Admit) 97 % -- -- -- --   Oxygen Saturation (Exercise) 96 % -- -- -- --   Rating of Perceived Exertion (Exercise) 7 12 13 15  --   Symptoms chest pain 2/10 none none none --   Comments walk test results -- -- -- --   Duration -- Continue with 30 min of aerobic exercise without signs/symptoms of physical distress. Progress to 30 minutes of  aerobic without signs/symptoms of physical distress Continue with 30 min of aerobic exercise without signs/symptoms of physical distress. --   Intensity -- THRR unchanged THRR unchanged THRR unchanged --     Progression   Progression -- Continue to progress workloads to maintain intensity without signs/symptoms of physical distress. Continue to progress workloads to maintain intensity without signs/symptoms of physical distress. Continue to progress workloads to maintain intensity without signs/symptoms of physical distress. --   Average METs -- 3.04 3.07 4.71 --     Resistance Training   Training Prescription -- Yes Yes Yes --   Weight -- 4 lb 4 lb 8 lb --   Reps -- 10-15 10-15 10-15 --     Interval Training   Interval Training -- No No No --     Treadmill   MPH -- 2.2 2.5 3 --   Grade -- 1.5  1.5 2.5 --   Minutes -- 15 15 15  --   METs -- 3.14 3.43 4.33 --     NuStep   Level -- 3 5 9  --   Minutes -- 15 15 15  --   METs -- 2.5 3.6 6 --     Elliptical   Level -- -- -- 1 --   Speed -- -- -- 2.8 --   Minutes -- -- -- 15 --     REL-XR   Level -- 3 5 -- --   Minutes -- 15 15 -- --   METs -- 3.5 2.4 -- --     Biostep-RELP   Level -- 3 4 6  --   Minutes -- 15 15 15  --   METs -- 3 3 4  --     Home Exercise Plan   Plans to continue exercise at -- -- -- -- Home (comment)  walking, treadmill   Frequency -- -- -- -- Add 2 additional days to program exercise sessions.   Initial Home Exercises  Provided -- -- -- -- 01/04/20   Row Name 01/11/20 1300             Response to Exercise   Blood Pressure (Admit) 130/58       Blood Pressure (Exercise) 156/56       Blood Pressure (Exit) 126/70       Heart Rate (Admit) 64 bpm       Heart Rate (Exercise) 136 bpm       Heart Rate (Exit) 108 bpm       Rating of Perceived Exertion (Exercise) 13       Symptoms none       Duration Continue with 30 min of aerobic exercise without signs/symptoms of physical distress.       Intensity THRR unchanged         Progression   Progression Continue to progress workloads to maintain intensity without signs/symptoms of physical distress.       Average METs 4.2         Resistance Training   Training Prescription Yes       Weight 8 lb       Reps 10-15         Interval Training   Interval Training No         NuStep   Level 8       SPM 80       Minutes 15       METs 5.4         Biostep-RELP   Level 6       SPM 50       Minutes 15       METs 3         Home Exercise Plan   Plans to continue exercise at Home (comment)  walking, treadmill       Frequency Add 2 additional days to program exercise sessions.       Initial Home Exercises Provided 01/04/20              Exercise Comments:   Exercise Goals and Review:  Exercise Goals    Row Name 11/21/19 0919             Exercise Goals   Increase Physical Activity Yes       Intervention Provide advice, education, support and counseling about physical activity/exercise needs.;Develop an individualized exercise prescription for aerobic and resistive training based on initial evaluation findings, risk stratification,  comorbidities and participant's personal goals.       Expected Outcomes Short Term: Attend rehab on a regular basis to increase amount of physical activity.;Long Term: Add in home exercise to make exercise part of routine and to increase amount of physical activity.;Long Term: Exercising regularly at least 3-5 days a week.        Increase Strength and Stamina Yes       Intervention Provide advice, education, support and counseling about physical activity/exercise needs.;Develop an individualized exercise prescription for aerobic and resistive training based on initial evaluation findings, risk stratification, comorbidities and participant's personal goals.       Expected Outcomes Short Term: Increase workloads from initial exercise prescription for resistance, speed, and METs.;Short Term: Perform resistance training exercises routinely during rehab and add in resistance training at home;Long Term: Improve cardiorespiratory fitness, muscular endurance and strength as measured by increased METs and functional capacity (6MWT)       Able to understand and use rate of perceived exertion (RPE) scale Yes       Intervention Provide education and explanation on how to use RPE scale       Expected Outcomes Short Term: Able to use RPE daily in rehab to express subjective intensity level;Long Term:  Able to use RPE to guide intensity level when exercising independently       Able to understand and use Dyspnea scale Yes       Intervention Provide education and explanation on how to use Dyspnea scale       Expected Outcomes Long Term: Able to use Dyspnea scale to guide intensity level when exercising independently;Short Term: Able to use Dyspnea scale daily in rehab to express subjective sense of shortness of breath during exertion       Knowledge and understanding of Target Heart Rate Range (THRR) Yes       Intervention Provide education and explanation of THRR including how the numbers were predicted and where they are located for reference       Expected Outcomes Short Term: Able to state/look up THRR;Short Term: Able to use daily as guideline for intensity in rehab;Long Term: Able to use THRR to govern intensity when exercising independently       Able to check pulse independently Yes       Intervention Provide education and  demonstration on how to check pulse in carotid and radial arteries.;Review the importance of being able to check your own pulse for safety during independent exercise       Expected Outcomes Short Term: Able to explain why pulse checking is important during independent exercise;Long Term: Able to check pulse independently and accurately       Understanding of Exercise Prescription Yes       Intervention Provide education, explanation, and written materials on patient's individual exercise prescription       Expected Outcomes Short Term: Able to explain program exercise prescription;Long Term: Able to explain home exercise prescription to exercise independently              Exercise Goals Re-Evaluation :  Exercise Goals Re-Evaluation    Row Name 11/23/19 1309 11/28/19 1621 12/01/19 1310 12/13/19 1405 12/26/19 1520     Exercise Goal Re-Evaluation   Exercise Goals Review Increase Physical Activity;Able to understand and use rate of perceived exertion (RPE) scale;Knowledge and understanding of Target Heart Rate Range (THRR);Understanding of Exercise Prescription;Increase Strength and Stamina;Able to check pulse independently Increase Physical Activity;Increase Strength and Stamina;Understanding of Exercise Prescription Increase  Physical Activity;Increase Strength and Stamina Increase Physical Activity;Increase Strength and Stamina;Understanding of Exercise Prescription Increase Physical Activity;Increase Strength and Stamina;Understanding of Exercise Prescription   Comments Reviewed RPE and dyspnea scales, THR and program prescription with pt today.  Pt voiced understanding and was given a copy of goals to take home. [name] is off to a good start in rehab.  He has completed his first three full days of exercise.  He is already up to 3.5 METs onthe XR.  We will continue to monitor his progress. Patient is not doing any home exercise at home as of yet. He is doing house work at this time and does not have  enough time to put in exercise other than rehab. [name] is doing well. He has already reached level 4 on Biostep and level 5 on the NuStep. Will continue to monitor progress. [name] continues to do well in rehab.  He started to try out the ellipitcal and is off to a good start so far.  He ia up to level 9 on the T4.  We will continue monitor his progress.   Expected Outcomes Short: Use RPE daily to regulate intensity. Long: Follow program prescription in THR. Short; Continue to attend rehab regularly Long: Continue to follow program prescription Short: work on home xercise with EP's. Long: maintain a home exercise routine. Short: Increase handweights Long: Increase stamina/endurance and aim to progress MET level Short: Continue to improve stamina on the elliptical  Long; Continue to improve METs   Row Name 01/04/20 1339 01/11/20 1343           Exercise Goal Re-Evaluation   Exercise Goals Review Increase Physical Activity;Increase Strength and Stamina;Understanding of Exercise Prescription;Able to understand and use rate of perceived exertion (RPE) scale;Knowledge and understanding of Target Heart Rate Range (THRR);Able to check pulse independently Increase Physical Activity;Increase Strength and Stamina;Able to understand and use rate of perceived exertion (RPE) scale;Knowledge and understanding of Target Heart Rate Range (THRR)      Comments aReviewed home exercise with pt today.  Pt plans to walk and use treadmill at home for exercise.  Reviewed THR, pulse, RPE, sign and symptoms, pulse oximetery and when to call 911 or MD.  Also discussed weather considerations and indoor options.  Pt voiced understanding.  He is feeling like his strength and stamina are getting better.  Last week he was able to do the whole time on the elliptical. [name] attends consistently and works in correct THR range.  He has alos increased to 8 lb weights for strength training.      Expected Outcomes Short: Continue to improve  stamina Long; Continue to exercise at home. Short:  continue to attend consistently Long:  improve MET level             Discharge Exercise Prescription (Final Exercise Prescription Changes):  Exercise Prescription Changes - 01/11/20 1300      Response to Exercise   Blood Pressure (Admit) 130/58    Blood Pressure (Exercise) 156/56    Blood Pressure (Exit) 126/70    Heart Rate (Admit) 64 bpm    Heart Rate (Exercise) 136 bpm    Heart Rate (Exit) 108 bpm    Rating of Perceived Exertion (Exercise) 13    Symptoms none    Duration Continue with 30 min of aerobic exercise without signs/symptoms of physical distress.    Intensity THRR unchanged      Progression   Progression Continue to progress workloads to maintain intensity without signs/symptoms of  physical distress.    Average METs 4.2      Resistance Training   Training Prescription Yes    Weight 8 lb    Reps 10-15      Interval Training   Interval Training No      NuStep   Level 8    SPM 80    Minutes 15    METs 5.4      Biostep-RELP   Level 6    SPM 50    Minutes 15    METs 3      Home Exercise Plan   Plans to continue exercise at Home (comment)   walking, treadmill   Frequency Add 2 additional days to program exercise sessions.    Initial Home Exercises Provided 01/04/20           Nutrition:  Target Goals: Understanding of nutrition guidelines, daily intake of sodium '1500mg'$ , cholesterol '200mg'$ , calories 30% from fat and 7% or less from saturated fats, daily to have 5 or more servings of fruits and vegetables.  Education: Controlling Sodium/Reading Food Labels -Group verbal and written material supporting the discussion of sodium use in heart healthy nutrition. Review and explanation with models, verbal and written materials for utilization of the food label.   Education: General Nutrition Guidelines/Fats and Fiber: -Group instruction provided by verbal, written material, models and posters to present  the general guidelines for heart healthy nutrition. Gives an explanation and review of dietary fats and fiber.   Biometrics:  Pre Biometrics - 11/21/19 0920      Pre Biometrics   Height 5' 10.5" (1.791 m)    Weight 204 lb 6.4 oz (92.7 kg)    BMI (Calculated) 28.9    Single Leg Stand 30 seconds            Nutrition Therapy Plan and Nutrition Goals:  Nutrition Therapy & Goals - 12/26/19 1506      Personal Nutrition Goals   Comments Declined nutrition intervention, will continue to check in with pt.           Nutrition Assessments:  Nutrition Assessments - 11/21/19 0921      MEDFICTS Scores   Pre Score 55           MEDIFICTS Score Key:          ?70 Need to make dietary changes          40-70 Heart Healthy Diet         ? 40 Therapeutic Level Cholesterol Diet  Nutrition Goals Re-Evaluation:  Nutrition Goals Re-Evaluation    [address] Name 12/01/19 1316 01/04/20 1345           Goals   Current Weight 205 lb (93 kg) --      Nutrition Goal Patient would like to get down to 180lbs. Reduce carbs      Comment [name] states his eating habit are not bad. He sometimes over eats but not often. He is willing to change his diet and meet with the dietician. [name] is struggling with is diet. He has been diligent about cutting back his carbs and sugars.  Otherwisde he is doing well and getting enough protein.      Expected Outcome Short: meet with Dietician. Long: form a nutrition plan that meets his needs. Shot: Continue to work on diet Long: Heart healthy.             Nutrition Goals Discharge (Final Nutrition Goals Re-Evaluation):  Nutrition Goals Re-Evaluation - 01/04/20  60      Goals   Nutrition Goal Reduce carbs    Comment [name] is struggling with is diet. He has been diligent about cutting back his carbs and sugars.  Otherwisde he is doing well and getting enough protein.    Expected Outcome Shot: Continue to work on diet Long: Heart healthy.            Psychosocial: Target Goals: Acknowledge presence or absence of significant depression and/or stress, maximize coping skills, provide positive support system. Participant is able to verbalize types and ability to use techniques and skills needed for reducing stress and depression.   Education: Depression - Provides group verbal and written instruction on the correlation between heart/lung disease and depressed mood, treatment options, and the stigmas associated with seeking treatment.   Education: Sleep Hygiene -Provides group verbal and written instruction about how sleep can affect your health.  Define sleep hygiene, discuss sleep cycles and impact of sleep habits. Review good sleep hygiene tips.     Education: Stress and Anxiety: - Provides group verbal and written instruction about the health risks of elevated stress and causes of high stress.  Discuss the correlation between heart/lung disease and anxiety and treatment options. Review healthy ways to manage with stress and anxiety.    Initial Review & Psychosocial Screening:  Initial Psych Review & Screening - 11/16/19 1338      Initial Review   Current issues with Current Stress Concerns    Source of Stress Concerns Occupation;Chronic Illness    Comments [name] has not been able to work since December due to his health and is making him stressed.      Family Dynamics   Good Support System? Yes    Comments He can look to his wife, son, daughter and sisters for support.      Barriers   Psychosocial barriers to participate in program There are no identifiable barriers or psychosocial needs.;The patient should benefit from training in stress management and relaxation.      Screening Interventions   Interventions Encouraged to exercise;To provide support and resources with identified psychosocial needs;Provide feedback about the scores to participant    Expected Outcomes Short Term goal: Utilizing psychosocial counselor, staff  and physician to assist with identification of specific Stressors or current issues interfering with healing process. Setting desired goal for each stressor or current issue identified.;Long Term Goal: Stressors or current issues are controlled or eliminated.;Short Term goal: Identification and review with participant of any Quality of Life or Depression concerns found by scoring the questionnaire.;Long Term goal: The participant improves quality of Life and PHQ9 Scores as seen by post scores and/or verbalization of changes           Quality of Life Scores:   Quality of Life - 11/21/19 0921      Quality of Life   Select Quality of Life      Quality of Life Scores   Health/Function Pre 12.47 %    Socioeconomic Pre 20 %    Psych/Spiritual Pre 19.71 %    Family Pre 29.5 %    GLOBAL Pre 18.07 %          Scores of 19 and below usually indicate a poorer quality of life in these areas.  A difference of  2-3 points is a clinically meaningful difference.  A difference of 2-3 points in the total score of the Quality of Life Index has been associated with significant improvement in overall quality of  life, self-image, physical symptoms, and general health in studies assessing change in quality of life.  PHQ-9: Recent Review Flowsheet Data    Depression screen [facility] 2/9 11/21/2019   Decreased Interest 1   Down, Depressed, Hopeless 0   PHQ - 2 Score 1   Altered sleeping 0   Tired, decreased energy 2   Change in appetite 0   Feeling bad or failure about yourself  0   Trouble concentrating 0   Moving slowly or fidgety/restless 1   Suicidal thoughts 0   PHQ-9 Score 4   Difficult doing work/chores Somewhat difficult     Interpretation of Total Score  Total Score Depression Severity:  1-4 = Minimal depression, 5-9 = Mild depression, 10-14 = Moderate depression, 15-19 = Moderately severe depression, 20-27 = Severe depression   Psychosocial Evaluation and Intervention:  Psychosocial Evaluation  - 11/16/19 1353      Psychosocial Evaluation & Interventions   Interventions Encouraged to exercise with the program and follow exercise prescription    Comments [name] has not been able to work since December due to his health and is making him stressed.    Expected Outcomes Short: Continue to exercise regularly to support mental health and notify staff of any changes. Long: maintain mental health and well being through teaching of rehab or prescribed medications independently.    Continue Psychosocial Services  Follow up required by staff           Psychosocial Re-Evaluation:  Psychosocial Re-Evaluation    [place] Name 12/01/19 1312 01/04/20 1340           Psychosocial Re-Evaluation   Current issues with Current Stress Concerns Current Stress Concerns      Comments He has not had a paycheck since October due to being sick and it is stressing him out. [name] is doing well in rehab.  His twitch gets annoying, but otherwise he is feeling good.  His sister just had valve surgery yesterday, after already losing one sister to the same surgery.  So far, things are going well. [facility] is at [facility]  Both he and his other sister have some regurge as well.      Expected Outcomes Short: Continue to exercise regularly to support mental health and notify staff of any changes. Long: maintain mental health and well being through teaching of rehab or prescribed medications independently. Short: Continue to exericse for mental boost Long; Continue to stay positive      Interventions Encouraged to attend Cardiac Rehabilitation for the exercise Encouraged to attend Cardiac Rehabilitation for the exercise      Continue Psychosocial Services  Follow up required by staff Follow up required by staff             Psychosocial Discharge (Final Psychosocial Re-Evaluation):  Psychosocial Re-Evaluation - 01/04/20 1340      Psychosocial Re-Evaluation   Current issues with Current Stress Concerns    Comments  [name] is doing well in rehab.  His twitch gets annoying, but otherwise he is feeling good.  His sister just had valve surgery yesterday, after already losing one sister to the same surgery.  So far, things are going well. She is at [facility].  Both he and his other sister have some regurge as well.    Expected Outcomes Short: Continue to exericse for mental boost Long; Continue to stay positive    Interventions Encouraged to attend Cardiac Rehabilitation for the exercise    Continue Psychosocial Services  Follow up  required by staff           Vocational Rehabilitation: Provide vocational rehab assistance to qualifying candidates.   Vocational Rehab Evaluation & Intervention:   Education: Education Goals: Education classes will be provided on a variety of topics geared toward better understanding of heart health and risk factor modification. Participant will state understanding/return demonstration of topics presented as noted by education test scores.  Learning Barriers/Preferences:  Learning Barriers/Preferences - 11/16/19 1340      Learning Barriers/Preferences   Learning Barriers None    Learning Preferences None           General Cardiac Education Topics:  AED/CPR: - Group verbal and written instruction with the use of models to demonstrate the basic use of the AED with the basic ABC's of resuscitation.   Anatomy & Physiology of the Heart: - Group verbal and written instruction and models provide basic cardiac anatomy and physiology, with the coronary electrical and arterial systems. Review of Valvular disease and Heart Failure   Cardiac Procedures: - Group verbal and written instruction to review commonly prescribed medications for heart disease. Reviews the medication, class of the drug, and side effects. Includes the steps to properly store meds and maintain the prescription regimen. (beta blockers and nitrates)   Cardiac Medications I: - Group verbal and written  instruction to review commonly prescribed medications for heart disease. Reviews the medication, class of the drug, and side effects. Includes the steps to properly store meds and maintain the prescription regimen.   Cardiac Rehab from 01/04/2020 in [facility] Cardiac and Pulmonary Rehab  Date 01/04/20  Educator SB  Instruction Review Code 1- Verbalizes Understanding      Cardiac Medications II: -Group verbal and written instruction to review commonly prescribed medications for heart disease. Reviews the medication, class of the drug, and side effects. (all other drug classes)    Go Sex-Intimacy & Heart Disease, Get SMART - Goal Setting: - Group verbal and written instruction through game format to discuss heart disease and the return to sexual intimacy. Provides group verbal and written material to discuss and apply goal setting through the application of the S.M.A.R.T. Method.   Other Matters of the Heart: - Provides group verbal, written materials and models to describe Stable Angina and Peripheral Artery. Includes description of the disease process and treatment options available to the cardiac patient.   Infection Prevention: - Provides verbal and written material to individual with discussion of infection control including proper hand washing and proper equipment cleaning during exercise session.   Cardiac Rehab from 01/04/2020 in [facility] Cardiac and Pulmonary Rehab  Date 11/16/19  Educator [facility]  Instruction Review Code 1- Verbalizes Understanding      Falls Prevention: - Provides verbal and written material to individual with discussion of falls prevention and safety.   Cardiac Rehab from 01/04/2020 in [facility] Cardiac and Pulmonary Rehab  Date 11/16/19  Educator [facility]  Instruction Review Code 1- Verbalizes Understanding      Other: -Provides group and verbal instruction on various topics (see comments)   Knowledge Questionnaire Score:  Knowledge Questionnaire Score - 11/21/19 0921       Knowledge Questionnaire Score   Pre Score 25/26 Education Focus: Nutrition           Core Components/Risk Factors/Patient Goals at Admission:  Personal Goals and Risk Factors at Admission - 11/21/19 0922      Core Components/Risk Factors/Patient Goals on Admission    Weight Management Yes;Weight Loss    Intervention  Weight Management: Develop a combined nutrition and exercise program designed to reach desired caloric intake, while maintaining appropriate intake of nutrient and fiber, sodium and fats, and appropriate energy expenditure required for the weight goal.;Weight Management: Provide education and appropriate resources to help participant work on and attain dietary goals.    Admit Weight 204 lb 6.4 oz (92.7 kg)    Goal Weight: Short Term 199 lb (90.3 kg)    Goal Weight: Long Term 194 lb (88 kg)    Expected Outcomes Short Term: Continue to assess and modify interventions until short term weight is achieved;Long Term: Adherence to nutrition and physical activity/exercise program aimed toward attainment of established weight goal;Weight Loss: Understanding of general recommendations for a balanced deficit meal plan, which promotes 1-2 lb weight loss per week and includes a negative energy balance of [phone] kcal/d;Understanding recommendations for meals to include 15-35% energy as protein, 25-35% energy from fat, 35-60% energy from carbohydrates, less than 267m of dietary cholesterol, 20-35 gm of total fiber daily;Understanding of distribution of calorie intake throughout the day with the consumption of 4-5 meals/snacks    Improve shortness of breath with ADL's Yes    Intervention Provide education, individualized exercise plan and daily activity instruction to help decrease symptoms of SOB with activities of daily living.    Expected Outcomes Short Term: Improve cardiorespiratory fitness to achieve a reduction of symptoms when performing ADLs;Long Term: Be able to perform more ADLs  without symptoms or delay the onset of symptoms    Diabetes Yes    Intervention Provide education about signs/symptoms and action to take for hypo/hyperglycemia.;Provide education about proper nutrition, including hydration, and aerobic/resistive exercise prescription along with prescribed medications to achieve blood glucose in normal ranges: Fasting glucose 65-99 mg/dL    Expected Outcomes Short Term: Participant verbalizes understanding of the signs/symptoms and immediate care of hyper/hypoglycemia, proper foot care and importance of medication, aerobic/resistive exercise and nutrition plan for blood glucose control.;Long Term: Attainment of HbA1C < 7%.    Lipids Yes    Intervention Provide education and support for participant on nutrition & aerobic/resistive exercise along with prescribed medications to achieve LDL <721m HDL >4042m   Expected Outcomes Short Term: Participant states understanding of desired cholesterol values and is compliant with medications prescribed. Participant is following exercise prescription and nutrition guidelines.;Long Term: Cholesterol controlled with medications as prescribed, with individualized exercise RX and with personalized nutrition plan. Value goals: LDL < 24m67mDL > 40 mg.           Education:Diabetes - Individual verbal and written instruction to review signs/symptoms of diabetes, desired ranges of glucose level fasting, after meals and with exercise. Acknowledge that pre and post exercise glucose checks will be done for 3 sessions at entry of program.   [facility]  Date 11/16/19  [facility]- Verbalizes Understanding      Education: Know Your Numbers and Risk Factors: -Group verbal and written instruction about important numbers in your health.  Discussion of what are risk factors and how they play a role in the disease process.  Review of Cholesterol, Blood  Pressure, Diabetes, and BMI and the role they play in your overall health.   Core Components/Risk Factors/Patient Goals Review:   Goals and Risk Factor Review    Row Name 12/01/19 1314 01/04/20 1343           Core Components/Risk Factors/Patient Goals Review   Personal  Goals Review Weight Management/Obesity;Lipids;Diabetes;Improve shortness of breath with ADL's Weight Management/Obesity;Lipids;Diabetes;Improve shortness of breath with ADL's      Review Spoke to patient about their shortness of breath and what they can do to improve. Patient has been informed of breathing techniques when starting the program. Patient is informed to tell staff if they have had any med changes and that certain meds they are taking or not taking can be causing shortness of breath. [name] is doing well in rehab.  His weight is down some and he would like to continue to lose more.  He is doing well with his pressures still.  Sugars have been good overall. His breathing is also getting better, but still has some episodes of SOB.      Expected Outcomes Short: Attend [facility] regularly to improve shortness of breath with ADL's. Long: maintain independence with ADL's Short: Continue to work on weight loss Long; Continue to improve SOB.             Core Components/Risk Factors/Patient Goals at Discharge (Final Review):   Goals and Risk Factor Review - 01/04/20 1343      Core Components/Risk Factors/Patient Goals Review   Personal Goals Review Weight Management/Obesity;Lipids;Diabetes;Improve shortness of breath with ADL's    Review [name] is doing well in rehab.  His weight is down some and he would like to continue to lose more.  He is doing well with his pressures still.  Sugars have been good overall. His breathing is also getting better, but still has some episodes of SOB.    Expected Outcomes Short: Continue to work on weight loss Long; Continue to improve SOB.           ITP Comments:  ITP Comments     Row Name 11/16/19 1352 11/21/19 0915 11/23/19 0608 11/23/19 1308 12/21/19 0759   ITP Comments Virtual Visit completed. Patient informed on EP and RD appointment and 6 Minute walk test. Patient also informed of patient health questionnaires on My Chart. Patient Verbalizes understanding. Visit diagnosis can be found in [facility] 11/14/2019. Completed 6MWT and gym orientation.  Initial ITP created and sent for review to Dr. Emily Filbert, Medical Director. 30 Day review completed. Medical Director ITP review done, changes made as directed, and signed approval by Medical Director. First full day of exercise!  Patient was oriented to gym and equipment including functions, settings, policies, and procedures.  Patient's individual exercise prescription and treatment plan were reviewed.  All starting workloads were established based on the results of the 6 minute walk test done at initial orientation visit.  The plan for exercise progression was also introduced and progression will be customized based on patient's performance and goals. 30 Day review completed. Medical Director ITP review done, changes made as directed, and signed approval by Medical Director.   [place] Name 01/17/20 1412 01/18/20 0624         ITP Comments Patient called, reports that he injured his back at home. He saw a provider today who informed him to take muscle relaxer's and rest the remaining of the week. Patient is not able to come to [facility] this week and plans to call next Monday to let us know how he is feeling and if able to attend rehab. 30 Day review completed. Medical Director ITP review done, changes made as directed, and signed approval by Medical Director.             Comments:

## 2020-01-25 ENCOUNTER — Encounter: Payer: 59 | Admitting: *Deleted

## 2020-01-25 ENCOUNTER — Other Ambulatory Visit: Payer: Self-pay | Admitting: Cardiology

## 2020-01-25 ENCOUNTER — Other Ambulatory Visit: Payer: Self-pay

## 2020-01-25 ENCOUNTER — Encounter: Payer: Self-pay | Admitting: *Deleted

## 2020-01-25 DIAGNOSIS — I25118 Atherosclerotic heart disease of native coronary artery with other forms of angina pectoris: Secondary | ICD-10-CM | POA: Diagnosis not present

## 2020-01-25 NOTE — Progress Notes (Signed)
Daily Session Note  Patient Details  Name: Travis Fletcher. MRN: [id number] Date of Birth: [birth year] Referring Provider:     Cardiac Rehab from 11/21/2019 in [facility] Cardiac and Pulmonary Rehab  Referring Provider Adrian Prows MD      Encounter Date: 01/25/2020  Check In:  Session Check In - 01/25/20 1353      Check-In   Supervising physician immediately available to respond to emergencies See telemetry face sheet for immediately available ER MD    Location [facility]-Cardiac & Pulmonary Rehab    Staff Present Nyoka Cowden, RN, BSN, MA;Joseph Hood Sharren Bridge, [place] Exercise Physiologist    Virtual Visit No    Medication changes reported     No    Fall or balance concerns reported    No    Tobacco Cessation No Change    Warm-up and Cool-down Performed on first and last piece of equipment    Resistance Training Performed Yes    VAD Patient? No    PAD/SET Patient? No      Pain Assessment   Currently in Pain? No/denies              Social History   Tobacco Use  Smoking Status Never Smoker  Smokeless Tobacco Never Used    Goals Met:  Independence with exercise equipment Exercise tolerated well No report of cardiac concerns or symptoms Strength training completed today  Goals Unmet:  Not Applicable  Comments: Pt completed 1 round of exercise and left to go home due to back pain.   Dr. Emily Filbert is Medical Director for [place] and [facility].

## 2020-02-06 ENCOUNTER — Other Ambulatory Visit: Payer: Self-pay

## 2020-02-06 ENCOUNTER — Encounter: Payer: 59 | Admitting: *Deleted

## 2020-02-06 ENCOUNTER — Other Ambulatory Visit: Payer: Self-pay | Admitting: Cardiology

## 2020-02-06 DIAGNOSIS — I25118 Atherosclerotic heart disease of native coronary artery with other forms of angina pectoris: Secondary | ICD-10-CM | POA: Diagnosis not present

## 2020-02-06 NOTE — Progress Notes (Signed)
Daily Session Note  Patient Details  Name: Travis Fletcher. MRN: [id number] Date of Birth: May 19, 1956 Referring Provider:     Cardiac Rehab from 11/21/2019 in [facility] Cardiac and Pulmonary Rehab  Referring Provider Adrian Prows MD      Encounter Date: 02/06/2020  Check In:  Session Check In - 02/06/20 1332      Check-In   Supervising physician immediately available to respond to emergencies See telemetry face sheet for immediately available ER MD    Location [facility]-Cardiac & Pulmonary Rehab    Staff Present Renita Papa, RN Margurite Auerbach, MS Exercise Physiologist;Kelly Amedeo Plenty, BS, ACSM CEP, Exercise Physiologist;Amanda Oletta Darter, BA, ACSM CEP, Exercise Physiologist    Virtual Visit No    Medication changes reported     No    Fall or balance concerns reported    No    Warm-up and Cool-down Performed on first and last piece of equipment    Resistance Training Performed Yes    VAD Patient? No    PAD/SET Patient? No      Pain Assessment   Currently in Pain? No/denies              Social History   Tobacco Use  Smoking Status Never Smoker  Smokeless Tobacco Never Used    Goals Met:  Independence with exercise equipment Exercise tolerated well No report of cardiac concerns or symptoms Strength training completed today  Goals Unmet:  Not Applicable  Comments: Pt able to follow exercise prescription today without complaint.  Will continue to monitor for progression.    Dr. Emily Filbert is Medical Director for [place] and [facility].

## 2020-02-08 ENCOUNTER — Other Ambulatory Visit: Payer: Self-pay

## 2020-02-08 ENCOUNTER — Encounter: Payer: 59 | Attending: Cardiology | Admitting: *Deleted

## 2020-02-08 DIAGNOSIS — I25118 Atherosclerotic heart disease of native coronary artery with other forms of angina pectoris: Secondary | ICD-10-CM | POA: Diagnosis present

## 2020-02-08 DIAGNOSIS — Z87442 Personal history of urinary calculi: Secondary | ICD-10-CM | POA: Insufficient documentation

## 2020-02-08 DIAGNOSIS — Z7982 Long term (current) use of aspirin: Secondary | ICD-10-CM | POA: Diagnosis not present

## 2020-02-08 DIAGNOSIS — Z79899 Other long term (current) drug therapy: Secondary | ICD-10-CM | POA: Diagnosis not present

## 2020-02-08 DIAGNOSIS — E7800 Pure hypercholesterolemia, unspecified: Secondary | ICD-10-CM | POA: Diagnosis not present

## 2020-02-08 DIAGNOSIS — Z8546 Personal history of malignant neoplasm of prostate: Secondary | ICD-10-CM | POA: Insufficient documentation

## 2020-02-08 DIAGNOSIS — Z7902 Long term (current) use of antithrombotics/antiplatelets: Secondary | ICD-10-CM | POA: Diagnosis not present

## 2020-02-08 DIAGNOSIS — Z9641 Presence of insulin pump (external) (internal): Secondary | ICD-10-CM | POA: Insufficient documentation

## 2020-02-08 DIAGNOSIS — E1140 Type 2 diabetes mellitus with diabetic neuropathy, unspecified: Secondary | ICD-10-CM | POA: Diagnosis not present

## 2020-02-08 DIAGNOSIS — R0600 Dyspnea, unspecified: Secondary | ICD-10-CM | POA: Insufficient documentation

## 2020-02-08 DIAGNOSIS — Z794 Long term (current) use of insulin: Secondary | ICD-10-CM | POA: Diagnosis not present

## 2020-02-08 DIAGNOSIS — M1990 Unspecified osteoarthritis, unspecified site: Secondary | ICD-10-CM | POA: Insufficient documentation

## 2020-02-08 NOTE — Progress Notes (Signed)
Daily Session Note  Patient Details  Name: Travis Fletcher. MRN: [id number] Date of Birth: 09-09-1955 Referring Provider:     Cardiac Rehab from 11/21/2019 in [facility] Cardiac and Pulmonary Rehab  Referring Provider Adrian Prows MD      Encounter Date: 02/08/2020  Check In:  Session Check In - 02/08/20 1329      Check-In   Supervising physician immediately available to respond to emergencies See telemetry face sheet for immediately available ER MD    Location [facility]-Cardiac & Pulmonary Rehab    Staff Present Renita Papa, RN BSN;Joseph Lou Min[place], [place] Exercise Physiologist    Virtual Visit No    Medication changes reported     No    Fall or balance concerns reported    No    Warm-up and Cool-down Performed on first and last piece of equipment    Resistance Training Performed Yes    VAD Patient? No    PAD/SET Patient? No      Pain Assessment   Currently in Pain? No/denies              Social History   Tobacco Use  Smoking Status Never Smoker  Smokeless Tobacco Never Used    Goals Met:  Independence with exercise equipment Exercise tolerated well No report of cardiac concerns or symptoms Strength training completed today  Goals Unmet:  Not Applicable  Comments: Pt able to follow exercise prescription today without complaint.  Will continue to monitor for progression.    Dr. Emily Filbert is Medical Director for [place] and [facility].

## 2020-02-09 ENCOUNTER — Other Ambulatory Visit: Payer: Self-pay

## 2020-02-09 ENCOUNTER — Encounter: Payer: 59 | Admitting: *Deleted

## 2020-02-09 DIAGNOSIS — I25118 Atherosclerotic heart disease of native coronary artery with other forms of angina pectoris: Secondary | ICD-10-CM

## 2020-02-09 NOTE — Progress Notes (Signed)
Daily Session Note  Patient Details  Name: Travis Fletcher. MRN: [id number] Date of Birth: 06-23-1955 Referring Provider:     Cardiac Rehab from 11/21/2019 in [facility] Cardiac and Pulmonary Rehab  Referring Provider Adrian Prows MD      Encounter Date: 02/09/2020  Check In:  Session Check In - 02/09/20 1333      Check-In   Supervising physician immediately available to respond to emergencies See telemetry face sheet for immediately available ER MD    Location [facility]-Cardiac & Pulmonary Rehab    Staff Present Renita Papa, RN BSN;Jessica Luan Pulling, MA, RCEP, CCRP, Marylynn Pearson, MS Exercise Physiologist;Melissa Caiola RDN, LDN    Virtual Visit No    Medication changes reported     No    Fall or balance concerns reported    No    Warm-up and Cool-down Performed on first and last piece of equipment    Resistance Training Performed Yes    VAD Patient? No    PAD/SET Patient? No      Pain Assessment   Currently in Pain? No/denies              Social History   Tobacco Use  Smoking Status Never Smoker  Smokeless Tobacco Never Used    Goals Met:  Independence with exercise equipment Exercise tolerated well No report of cardiac concerns or symptoms Strength training completed today  Goals Unmet:  Not Applicable  Comments: Pt able to follow exercise prescription today without complaint.  Will continue to monitor for progression.    Dr. Emily Filbert is Medical Director for [place] and [facility].

## 2020-02-15 ENCOUNTER — Other Ambulatory Visit: Payer: Self-pay

## 2020-02-15 ENCOUNTER — Encounter: Payer: 59 | Admitting: *Deleted

## 2020-02-15 ENCOUNTER — Encounter: Payer: Self-pay | Admitting: *Deleted

## 2020-02-15 DIAGNOSIS — I25118 Atherosclerotic heart disease of native coronary artery with other forms of angina pectoris: Secondary | ICD-10-CM | POA: Diagnosis not present

## 2020-02-15 NOTE — Progress Notes (Signed)
Daily Session Note  Patient Details  Name: Travis Fletcher. MRN: [id number] Date of Birth: 02-Dec-1955 Referring Provider:     Cardiac Rehab from 11/21/2019 in [facility] Cardiac and Pulmonary Rehab  Referring Provider Adrian Prows MD      Encounter Date: 02/15/2020  Check In:  Session Check In - 02/15/20 1334      Check-In   Supervising physician immediately available to respond to emergencies See telemetry face sheet for immediately available ER MD    Location [facility]-Cardiac & Pulmonary Rehab    Staff Present Renita Papa, RN Margurite Auerbach, MS Exercise Physiologist;Amanda Oletta Darter, BA, ACSM CEP, Exercise Physiologist;Melissa Caiola RDN, LDN    Virtual Visit No    Medication changes reported     No    Fall or balance concerns reported    No    Warm-up and Cool-down Performed on first and last piece of equipment    Resistance Training Performed Yes    VAD Patient? No    PAD/SET Patient? No      Pain Assessment   Currently in Pain? No/denies              Social History   Tobacco Use  Smoking Status Never Smoker  Smokeless Tobacco Never Used    Goals Met:  Independence with exercise equipment Exercise tolerated well No report of cardiac concerns or symptoms Strength training completed today  Goals Unmet:  Not Applicable  Comments: Pt able to follow exercise prescription today without complaint.  Will continue to monitor for progression.    Dr. Emily Filbert is Medical Director for [place] and [facility].

## 2020-02-15 NOTE — Progress Notes (Signed)
Cardiac Individual Treatment Plan  Patient Details  Name: Travis Fletcher. MRN: [id number] Date of Birth: 10/08/1955 Referring Provider:     Cardiac Rehab from 11/21/2019 in [facility] Cardiac and Pulmonary Rehab  Referring Provider Adrian Prows MD      Initial Encounter Date:    Cardiac Rehab from 11/21/2019 in [facility] Cardiac and Pulmonary Rehab  Date 11/21/19      Visit Diagnosis: Coronary artery disease of native artery of native heart with stable angina pectoris ([place])  Patient's Home Medications on Admission:  Current Outpatient Medications:    acetaminophen (TYLENOL) 325 MG tablet, Take 650 mg by mouth every 6 (six) hours as needed for moderate pain or headache., Disp: , Rfl:    albuterol (VENTOLIN HFA) 108 (90 Base) MCG/ACT inhaler, Inhale 1 puff into the lungs every 6 (six) hours as needed for wheezing or shortness of breath. , Disp: , Rfl:    amLODipine (NORVASC) 5 MG tablet, TAKE 1 TABLET BY MOUTH ONCE A DAY, Disp: 30 tablet, Rfl: 6   aspirin 81 MG tablet, Take 1 tablet (81 mg total) by mouth daily., Disp: , Rfl:    clopidogrel (PLAVIX) 75 MG tablet, TAKE 1 TABLET BY MOUTH ONCE DAILY, Disp: 30 tablet, Rfl: 3   cyclobenzaprine (FLEXERIL) 10 MG tablet, Take 1 tablet (10 mg total) by mouth 2 (two) times daily as needed for muscle spasms., Disp: 20 tablet, Rfl: 0   guaiFENesin (MUCINEX) 600 MG 12 hr tablet, Take 600 mg by mouth 2 (two) times daily as needed (congestion)., Disp: , Rfl:    ibuprofen (ADVIL) 600 MG tablet, Take 1 tablet (600 mg total) by mouth every 6 (six) hours as needed., Disp: 30 tablet, Rfl: 0   insulin aspart (NOVOLOG) 100 UNIT/ML injection, Inject into the skin See admin instructions. Uses via Insulin Pump, Disp: , Rfl:    losartan (COZAAR) 25 MG tablet, TAKE HALF A TABLET BY MOUTH DAILY (Patient taking differently: Take 12.5 mg by mouth daily. ), Disp: 90 tablet, Rfl: 0   MELATONIN PO, Take 1 tablet by mouth at bedtime as needed (sleep)., Disp: ,  Rfl:    metoprolol tartrate (LOPRESSOR) 25 MG tablet, TAKE 1/2 TABLET BY MOUTH TWICE A DAY AS NEEDED, Disp: 180 tablet, Rfl: 0   Multiple Vitamins-Minerals (CENTRUM SILVER 50+MEN PO), Take 1 tablet by mouth daily., Disp: , Rfl:    nitroGLYCERIN (NITROSTAT) 0.4 MG SL tablet, Place 1 tablet (0.4 mg total) under the tongue every 5 (five) minutes as needed for chest pain., Disp: 30 tablet, Rfl: 6   ranolazine (RANEXA) 500 MG 12 hr tablet, Take 1 tablet (500 mg total) by mouth 2 (two) times daily., Disp: 60 tablet, Rfl: 1  Past Medical History: Past Medical History:  Diagnosis Date   Anginal pain ([place])    Arthritis    "hands" (07/11/2014)   CAD (coronary artery disease), native coronary artery 07/24/2018   Coronary artery disease    High cholesterol    Hypercholesteremia 07/24/2018   Insulin dependent diabetes mellitus    Kidney stones    "passed them all"   Neuromuscular disorder ([place])    neuropathy   Prostate cancer ([place]) 2010   Statin intolerance 07/24/2018   Uncontrolled diabetes mellitus ([place]) 07/24/2018    Tobacco Use: Social History   Tobacco Use  Smoking Status Never Smoker  Smokeless Tobacco Never Used    Labs: Recent Review Scientist, physiological    Labs for ITP Cardiac and Pulmonary Rehab Latest Ref Rng &  Units 02/25/2008 01/04/2011 01/05/2011 06/15/2015 11/25/2019   Cholestrol 100 - 199 mg/dL - - 161 131 273(H)   LDLCALC 0 - 99 mg/dL - - 87 61 195(H)   HDL >39 mg/dL - - 51 55 55   Trlycerides 0 - 149 mg/dL - - 114 76 127   Hemoglobin A1c <5.7 % - 8.0(H) - - -   TCO2 - 28 - - - -       Exercise Target Goals: Exercise Program Goal: Individual exercise prescription set using results from initial 6 min walk test and THRR while considering  patients activity barriers and safety.   Exercise Prescription Goal: Initial exercise prescription builds to 30-45 minutes a day of aerobic activity, 2-3 days per week.  Home exercise guidelines will be given to patient during  program as part of exercise prescription that the participant will acknowledge.   Education: Aerobic Exercise & Resistance Training: - Gives group verbal and written instruction on the various components of exercise. Focuses on aerobic and resistive training programs and the benefits of this training and how to safely progress through these programs..   Education: Exercise & Equipment Safety: - Individual verbal instruction and demonstration of equipment use and safety with use of the equipment.   Cardiac Rehab from 02/08/2020 in [facility] Cardiac and Pulmonary Rehab  Date 11/16/19  Educator [facility]  Instruction Review Code 1- Verbalizes Understanding      Education: Exercise Physiology & General Exercise Guidelines: - Group verbal and written instruction with models to review the exercise physiology of the cardiovascular system and associated critical values. Provides general exercise guidelines with specific guidelines to those with heart or lung disease.    Cardiac Rehab from 02/08/2020 in [facility] Cardiac and Pulmonary Rehab  Date 12/21/19  Educator AS  Instruction Review Code 1- Verbalizes Understanding      Education: Flexibility, Balance, Mind/Body Relaxation: Provides group verbal/written instruction on the benefits of flexibility and balance training, including mind/body exercise modes such as yoga, pilates and tai chi.  Demonstration and skill practice provided.   Cardiac Rehab from 02/08/2020 in [facility] Cardiac and Pulmonary Rehab  Date 02/08/20  Educator AS  Instruction Review Code 1- Verbalizes Understanding      Activity Barriers & Risk Stratification:  Activity Barriers & Cardiac Risk Stratification - 11/21/19 0916      Activity Barriers & Cardiac Risk Stratification   Activity Barriers Joint Problems;Deconditioning;Muscular Weakness;Chest Pain/Angina   shoulder problems   Cardiac Risk Stratification Moderate           6 Minute Walk:  6 Minute Walk    Row Name 11/21/19 0916           6 Minute Walk   Phase Initial     Distance 1210 feet     Walk Time 6 minutes     # of Rest Breaks 0     MPH 2.29     METS 3.23     RPE 7     VO2 Peak 11.28     Symptoms Yes (comment)     Comments angina 2/10     Resting HR 80 bpm     Resting BP 122/70     Resting Oxygen Saturation  97 %     Exercise Oxygen Saturation  during 6 min walk 96 %     Max Ex. HR 105 bpm     Max Ex. BP 128/64     2 Minute Post BP 122/74  Oxygen Initial Assessment:   Oxygen Re-Evaluation:   Oxygen Discharge (Final Oxygen Re-Evaluation):   Initial Exercise Prescription:  Initial Exercise Prescription - 11/21/19 0900      Date of Initial Exercise RX and Referring Provider   Date 11/21/19    Referring Provider Adrian Prows MD      Treadmill   MPH 2.2    Grade 1.5    Minutes 15    METs 3.14      NuStep   Level 3    SPM 80    Minutes 15    METs 3      REL-XR   Level 3    Speed 50    Minutes 15    METs 3      Biostep-RELP   Level 3    SPM 50    Minutes 15    METs 3      Prescription Details   Frequency (times per week) 3    Duration Progress to 30 minutes of continuous aerobic without signs/symptoms of physical distress      Intensity   THRR 40-80% of Max Heartrate 112-143    Ratings of Perceived Exertion 11-13    Perceived Dyspnea 0-4      Progression   Progression Continue to progress workloads to maintain intensity without signs/symptoms of physical distress.      Resistance Training   Training Prescription Yes    Weight 4 lb    Reps 10-15           Perform Capillary Blood Glucose checks as needed.  Exercise Prescription Changes:  Exercise Prescription Changes    Row Name 11/21/19 0900 11/28/19 1600 12/13/19 1300 12/26/19 1500 01/04/20 1300     Response to Exercise   Blood Pressure (Admit) 122/70 112/54 130/62 118/60 --   Blood Pressure (Exercise) 128/64 162/64 122/64 150/56 --   Blood Pressure (Exit) 122/74 124/64 124/66 106/58 --    Heart Rate (Admit) 80 bpm 85 bpm 81 bpm 91 bpm --   Heart Rate (Exercise) 105 bpm 116 bpm 122 bpm 137 bpm --   Heart Rate (Exit) 77 bpm 92 bpm 96 bpm 109 bpm --   Oxygen Saturation (Admit) 97 % -- -- -- --   Oxygen Saturation (Exercise) 96 % -- -- -- --   Rating of Perceived Exertion (Exercise) _0 --   Symptoms chest pain 2/10 none none none --   Comments walk test results -- -- -- --   Duration -- Continue with 30 min of aerobic exercise without signs/symptoms of physical distress. Progress to 30 minutes of  aerobic without signs/symptoms of physical distress Continue with 30 min of aerobic exercise without signs/symptoms of physical distress. --   Intensity -- THRR unchanged THRR unchanged THRR unchanged --     Progression   Progression -- Continue to progress workloads to maintain intensity without signs/symptoms of physical distress. Continue to progress workloads to maintain intensity without signs/symptoms of physical distress. Continue to progress workloads to maintain intensity without signs/symptoms of physical distress. --   Average METs -- 3.04 3.07 4.71 --     Resistance Training   Training Prescription -- Yes Yes Yes --   Weight -- 4 lb 4 lb 8 lb --   Reps -- 10-15 10-15 10-15 --     Interval Training   Interval Training -- No No No --     Treadmill   MPH -- 2.2 2.5 3 --   Grade --  1.5 1.5 2.5 --   Minutes -- _0 --   METs -- 3.14 3.43 4.33 --     NuStep   Level -- _1 --   Minutes -- _2 --   METs -- 2.5 3.6 6 --     Elliptical   Level -- -- -- 1 --   Speed -- -- -- 2.8 --   Minutes -- -- -- 15 --     REL-XR   Level -- 3 5 -- --   Minutes -- 15 15 -- --   METs -- 3.5 2.4 -- --     Biostep-RELP   Level -- _3 --   Minutes -- _4 --   METs -- _5 --     Home Exercise Plan   Plans to continue exercise at -- -- -- -- Home (comment)  walking, treadmill   Frequency -- -- -- -- Add 2 additional days to program exercise sessions.    Initial Home Exercises Provided -- -- -- -- 01/04/20   Row Name 01/11/20 1300 01/25/20 1500 02/07/20 0900         Response to Exercise   Blood Pressure (Admit) 130/58 132/58 124/62     Blood Pressure (Exercise) 156/56 164/58 130/62     Blood Pressure (Exit) 126/70 110/58 120/64     Heart Rate (Admit) 64 bpm 88 bpm 81 bpm     Heart Rate (Exercise) 136 bpm 133 bpm 117 bpm     Heart Rate (Exit) 108 bpm 99 bpm 80 bpm     Rating of Perceived Exertion (Exercise) _6 Symptoms none none none     Duration Continue with 30 min of aerobic exercise without signs/symptoms of physical distress. Continue with 30 min of aerobic exercise without signs/symptoms of physical distress. Continue with 30 min of aerobic exercise without signs/symptoms of physical distress.     Intensity THRR unchanged THRR unchanged THRR unchanged       Progression   Progression Continue to progress workloads to maintain intensity without signs/symptoms of physical distress. Continue to progress workloads to maintain intensity without signs/symptoms of physical distress. Continue to progress workloads to maintain intensity without signs/symptoms of physical distress.     Average METs 4.2 4.99 2.45       Resistance Training   Training Prescription Yes Yes Yes     Weight 8 lb 8 lb 8 lb     Reps 10-15 10-15 10-15       Interval Training   Interval Training No No No       Treadmill   MPH -- 2.8 --     Grade -- 3.5 --     Minutes -- 15 --     METs -- 4.49 --       NuStep   Level 8 -- 5     SPM 80 -- 80     Minutes 15 -- 15     METs 5.4 -- 2.9       REL-XR   Level -- 10 --     Minutes -- 15 --     METs -- 5.5 --       Biostep-RELP   Level 6 -- 4     SPM 50 -- 50     Minutes 15 -- 15     METs 3 -- 2       Home Exercise Plan  Plans to continue exercise at Home (comment)  walking, treadmill Home (comment)  walking, treadmill Home (comment)  walking, treadmill     Frequency Add 2 additional days to  program exercise sessions. Add 2 additional days to program exercise sessions. Add 2 additional days to program exercise sessions.     Initial Home Exercises Provided 01/04/20 01/04/20 01/04/20            Exercise Comments:  Exercise Comments    Row Name 01/25/20 1418           Exercise Comments Pt completed 1 round of exercise and left to go home due to back pain.              Exercise Goals and Review:  Exercise Goals    Row Name 11/21/19 0919             Exercise Goals   Increase Physical Activity Yes       Intervention Provide advice, education, support and counseling about physical activity/exercise needs.;Develop an individualized exercise prescription for aerobic and resistive training based on initial evaluation findings, risk stratification, comorbidities and participant's personal goals.       Expected Outcomes Short Term: Attend rehab on a regular basis to increase amount of physical activity.;Long Term: Add in home exercise to make exercise part of routine and to increase amount of physical activity.;Long Term: Exercising regularly at least 3-5 days a week.       Increase Strength and Stamina Yes       Intervention Provide advice, education, support and counseling about physical activity/exercise needs.;Develop an individualized exercise prescription for aerobic and resistive training based on initial evaluation findings, risk stratification, comorbidities and participant's personal goals.       Expected Outcomes Short Term: Increase workloads from initial exercise prescription for resistance, speed, and METs.;Short Term: Perform resistance training exercises routinely during rehab and add in resistance training at home;Long Term: Improve cardiorespiratory fitness, muscular endurance and strength as measured by increased METs and functional capacity (6MWT)       Able to understand and use rate of perceived exertion (RPE) scale Yes       Intervention Provide education  and explanation on how to use RPE scale       Expected Outcomes Short Term: Able to use RPE daily in rehab to express subjective intensity level;Long Term:  Able to use RPE to guide intensity level when exercising independently       Able to understand and use Dyspnea scale Yes       Intervention Provide education and explanation on how to use Dyspnea scale       Expected Outcomes Long Term: Able to use Dyspnea scale to guide intensity level when exercising independently;Short Term: Able to use Dyspnea scale daily in rehab to express subjective sense of shortness of breath during exertion       Knowledge and understanding of Target Heart Rate Range (THRR) Yes       Intervention Provide education and explanation of THRR including how the numbers were predicted and where they are located for reference       Expected Outcomes Short Term: Able to state/look up THRR;Short Term: Able to use daily as guideline for intensity in rehab;Long Term: Able to use THRR to govern intensity when exercising independently       Able to check pulse independently Yes       Intervention Provide education and demonstration on how to check pulse in carotid and  radial arteries.;Review the importance of being able to check your own pulse for safety during independent exercise       Expected Outcomes Short Term: Able to explain why pulse checking is important during independent exercise;Long Term: Able to check pulse independently and accurately       Understanding of Exercise Prescription Yes       Intervention Provide education, explanation, and written materials on patient's individual exercise prescription       Expected Outcomes Short Term: Able to explain program exercise prescription;Long Term: Able to explain home exercise prescription to exercise independently              Exercise Goals Re-Evaluation :  Exercise Goals Re-Evaluation    Row Name 11/23/19 1309 11/28/19 1621 12/01/19 1310 12/13/19 1405 12/26/19 1520      Exercise Goal Re-Evaluation   Exercise Goals Review Increase Physical Activity;Able to understand and use rate of perceived exertion (RPE) scale;Knowledge and understanding of Target Heart Rate Range (THRR);Understanding of Exercise Prescription;Increase Strength and Stamina;Able to check pulse independently Increase Physical Activity;Increase Strength and Stamina;Understanding of Exercise Prescription Increase Physical Activity;Increase Strength and Stamina Increase Physical Activity;Increase Strength and Stamina;Understanding of Exercise Prescription Increase Physical Activity;Increase Strength and Stamina;Understanding of Exercise Prescription   Comments Reviewed RPE and dyspnea scales, THR and program prescription with pt today.  Pt voiced understanding and was given a copy of goals to take home. [name] is off to a good start in rehab.  He has completed his first three full days of exercise.  He is already up to 3.5 METs onthe XR.  We will continue to monitor his progress. Patient is not doing any home exercise at home as of yet. He is doing house work at this time and does not have enough time to put in exercise other than rehab. [name] is doing well. He has already reached level 4 on Biostep and level 5 on the NuStep. Will continue to monitor progress. [name] continues to do well in rehab.  He started to try out the ellipitcal and is off to a good start so far.  He ia up to level 9 on the T4.  We will continue monitor his progress.   Expected Outcomes Short: Use RPE daily to regulate intensity. Long: Follow program prescription in THR. Short; Continue to attend rehab regularly Long: Continue to follow program prescription Short: work on home xercise with EP's. Long: maintain a home exercise routine. Short: Increase handweights Long: Increase stamina/endurance and aim to progress MET level Short: Continue to improve stamina on the elliptical  Long; Continue to improve METs   Row Name 01/04/20 1339  01/11/20 1343 01/25/20 1505 02/06/20 1348       Exercise Goal Re-Evaluation   Exercise Goals Review Increase Physical Activity;Increase Strength and Stamina;Understanding of Exercise Prescription;Able to understand and use rate of perceived exertion (RPE) scale;Knowledge and understanding of Target Heart Rate Range (THRR);Able to check pulse independently Increase Physical Activity;Increase Strength and Stamina;Able to understand and use rate of perceived exertion (RPE) scale;Knowledge and understanding of Target Heart Rate Range (THRR) Increase Physical Activity;Increase Strength and Stamina;Knowledge and understanding of Target Heart Rate Range (THRR) Increase Physical Activity;Increase Strength and Stamina;Knowledge and understanding of Target Heart Rate Range (THRR)    Comments aReviewed home exercise with pt today.  Pt plans to walk and use treadmill at home for exercise.  Reviewed THR, pulse, RPE, sign and symptoms, pulse oximetery and when to call 911 or MD.  Also discussed weather considerations  and indoor options.  Pt voiced understanding.  He is feeling like his strength and stamina are getting better.  Last week he was able to do the whole time on the elliptical. [name] attends consistently and works in correct THR range.  He has alos increased to 8 lb weights for strength training. [name] has missed a few visits due to back pain.  He was able to return to class today.  We will continue to monitor for progression. [name] still has some trouble with his back.  He has seen his Dr and takes Flexaril and ibuprofen.  He also alternates heat and ice.  He is doing that a couple times a day.    Expected Outcomes Short: Continue to improve stamina Long; Continue to exercise at home. Short:  continue to attend consistently Long:  improve MET level Short: Protect back Long: Continue to improve stamina. Short:  follow Dr orders for back pain Long:  continue to build stamina           Discharge Exercise  Prescription (Final Exercise Prescription Changes):  Exercise Prescription Changes - 02/07/20 0900      Response to Exercise   Blood Pressure (Admit) 124/62    Blood Pressure (Exercise) 130/62    Blood Pressure (Exit) 120/64    Heart Rate (Admit) 81 bpm    Heart Rate (Exercise) 117 bpm    Heart Rate (Exit) 80 bpm    Rating of Perceived Exertion (Exercise) 11    Symptoms none    Duration Continue with 30 min of aerobic exercise without signs/symptoms of physical distress.    Intensity THRR unchanged      Progression   Progression Continue to progress workloads to maintain intensity without signs/symptoms of physical distress.    Average METs 2.45      Resistance Training   Training Prescription Yes    Weight 8 lb    Reps 10-15      Interval Training   Interval Training No      NuStep   Level 5    SPM 80    Minutes 15    METs 2.9      Biostep-RELP   Level 4    SPM 50    Minutes 15    METs 2      Home Exercise Plan   Plans to continue exercise at Home (comment)   walking, treadmill   Frequency Add 2 additional days to program exercise sessions.    Initial Home Exercises Provided 01/04/20           Nutrition:  Target Goals: Understanding of nutrition guidelines, daily intake of sodium <156m, cholesterol <2040m calories 30% from fat and 7% or less from saturated fats, daily to have 5 or more servings of fruits and vegetables.  Education: Controlling Sodium/Reading Food Labels -Group verbal and written material supporting the discussion of sodium use in heart healthy nutrition. Review and explanation with models, verbal and written materials for utilization of the food label.   Education: General Nutrition Guidelines/Fats and Fiber: -Group instruction provided by verbal, written material, models and posters to present the general guidelines for heart healthy nutrition. Gives an explanation and review of dietary fats and fiber.   Biometrics:  Pre Biometrics -  11/21/19 0920      Pre Biometrics   Height 5' 10.5" (1.791 m)    Weight 204 lb 6.4 oz (92.7 kg)    BMI (Calculated) 28.9    Single Leg Stand 30 seconds  Nutrition Therapy Plan and Nutrition Goals:  Nutrition Therapy & Goals - 12/26/19 1506      Personal Nutrition Goals   Comments Declined nutrition intervention, will continue to check in with pt.           Nutrition Assessments:  Nutrition Assessments - 11/21/19 0921      MEDFICTS Scores   Pre Score 55           MEDIFICTS Score Key:          ?70 Need to make dietary changes          40-70 Heart Healthy Diet         ? 40 Therapeutic Level Cholesterol Diet  Nutrition Goals Re-Evaluation:  Nutrition Goals Re-Evaluation    [place] Name 12/01/19 1316 01/04/20 1345 01/25/20 1534         Goals   Current Weight 205 lb (93 kg) -- --     Nutrition Goal Patient would like to get down to 180lbs. Reduce carbs Continue with current chnages, did not want to make any goals at this time.     Comment [name] states his eating habit are not bad. He sometimes over eats but not often. He is willing to change his diet and meet with the dietician. [name] is struggling with is diet. He has been diligent about cutting back his carbs and sugars.  Otherwisde he is doing well and getting enough protein. Checked in with [name], he still doesn't feel like he needs anything from me and would like to continue with current changes.     Expected Outcome Short: meet with Dietician. Long: form a nutrition plan that meets his needs. Shot: Continue to work on diet Long: Heart healthy. Continue with current chnages, did not want to make any goals at this time.            Nutrition Goals Discharge (Final Nutrition Goals Re-Evaluation):  Nutrition Goals Re-Evaluation - 01/25/20 1534      Goals   Nutrition Goal Continue with current chnages, did not want to make any goals at this time.    Comment Checked in with [name], he still doesn't feel  like he needs anything from me and would like to continue with current changes.    Expected Outcome Continue with current chnages, did not want to make any goals at this time.           Psychosocial: Target Goals: Acknowledge presence or absence of significant depression and/or stress, maximize coping skills, provide positive support system. Participant is able to verbalize types and ability to use techniques and skills needed for reducing stress and depression.   Education: Depression - Provides group verbal and written instruction on the correlation between heart/lung disease and depressed mood, treatment options, and the stigmas associated with seeking treatment.   Education: Sleep Hygiene -Provides group verbal and written instruction about how sleep can affect your health.  Define sleep hygiene, discuss sleep cycles and impact of sleep habits. Review good sleep hygiene tips.     Education: Stress and Anxiety: - Provides group verbal and written instruction about the health risks of elevated stress and causes of high stress.  Discuss the correlation between heart/lung disease and anxiety and treatment options. Review healthy ways to manage with stress and anxiety.    Initial Review & Psychosocial Screening:  Initial Psych Review & Screening - 11/16/19 1338      Initial Review   Current issues with Current Stress Concerns  Source of Stress Concerns Occupation;Chronic Illness    Comments [name] has not been able to work since December due to his health and is making him stressed.      Family Dynamics   Good Support System? Yes    Comments He can look to his wife, son, daughter and sisters for support.      Barriers   Psychosocial barriers to participate in program There are no identifiable barriers or psychosocial needs.;The patient should benefit from training in stress management and relaxation.      Screening Interventions   Interventions Encouraged to exercise;To  provide support and resources with identified psychosocial needs;Provide feedback about the scores to participant    Expected Outcomes Short Term goal: Utilizing psychosocial counselor, staff and physician to assist with identification of specific Stressors or current issues interfering with healing process. Setting desired goal for each stressor or current issue identified.;Long Term Goal: Stressors or current issues are controlled or eliminated.;Short Term goal: Identification and review with participant of any Quality of Life or Depression concerns found by scoring the questionnaire.;Long Term goal: The participant improves quality of Life and PHQ9 Scores as seen by post scores and/or verbalization of changes           Quality of Life Scores:   Quality of Life - 11/21/19 0921      Quality of Life   Select Quality of Life      Quality of Life Scores   Health/Function Pre 12.47 %    Socioeconomic Pre 20 %    Psych/Spiritual Pre 19.71 %    Family Pre 29.5 %    GLOBAL Pre 18.07 %          Scores of 19 and below usually indicate a poorer quality of life in these areas.  A difference of  2-3 points is a clinically meaningful difference.  A difference of 2-3 points in the total score of the Quality of Life Index has been associated with significant improvement in overall quality of life, self-image, physical symptoms, and general health in studies assessing change in quality of life.  PHQ-9: Recent Review Flowsheet Data    Depression screen [facility] 2/9 11/21/2019   Decreased Interest 1   Down, Depressed, Hopeless 0   PHQ - 2 Score 1   Altered sleeping 0   Tired, decreased energy 2   Change in appetite 0   Feeling bad or failure about yourself  0   Trouble concentrating 0   Moving slowly or fidgety/restless 1   Suicidal thoughts 0   PHQ-9 Score 4   Difficult doing work/chores Somewhat difficult     Interpretation of Total Score  Total Score Depression Severity:  1-4 = Minimal  depression, 5-9 = Mild depression, 10-14 = Moderate depression, 15-19 = Moderately severe depression, 20-27 = Severe depression   Psychosocial Evaluation and Intervention:  Psychosocial Evaluation - 11/16/19 1353      Psychosocial Evaluation & Interventions   Interventions Encouraged to exercise with the program and follow exercise prescription    Comments [name] has not been able to work since December due to his health and is making him stressed.    Expected Outcomes Short: Continue to exercise regularly to support mental health and notify staff of any changes. Long: maintain mental health and well being through teaching of rehab or prescribed medications independently.    Continue Psychosocial Services  Follow up required by staff           Psychosocial Re-Evaluation:  Psychosocial Re-Evaluation    Row Name 12/01/19 1312 01/04/20 1340 02/06/20 1353         Psychosocial Re-Evaluation   Current issues with Current Stress Concerns Current Stress Concerns --     Comments He has not had a paycheck since October due to being sick and it is stressing him out. [name] is doing well in rehab.  His twitch gets annoying, but otherwise he is feeling good.  His sister just had valve surgery yesterday, after already losing one sister to the same surgery.  So far, things are going well. She is at [facility].  Both he and his other sister have some regurge as well. Harolds sister went home Saturday after being at [facility] for a month.  He reports no other new stress.     Expected Outcomes Short: Continue to exercise regularly to support mental health and notify staff of any changes. Long: maintain mental health and well being through teaching of rehab or prescribed medications independently. Short: Continue to exericse for mental boost Long; Continue to stay positive Short:  continue to exercise Long: maintain positive outlook     Interventions Encouraged to attend Cardiac Rehabilitation for the  exercise Encouraged to attend Cardiac Rehabilitation for the exercise --     Continue Psychosocial Services  Follow up required by staff Follow up required by staff --            Psychosocial Discharge (Final Psychosocial Re-Evaluation):  Psychosocial Re-Evaluation - 02/06/20 1353      Psychosocial Re-Evaluation   Comments Harolds sister went home Saturday after being at [facility] for a month.  He reports no other new stress.    Expected Outcomes Short:  continue to exercise Long: maintain positive outlook           Vocational Rehabilitation: Provide vocational rehab assistance to qualifying candidates.   Vocational Rehab Evaluation & Intervention:   Education: Education Goals: Education classes will be provided on a variety of topics geared toward better understanding of heart health and risk factor modification. Participant will state understanding/return demonstration of topics presented as noted by education test scores.  Learning Barriers/Preferences:  Learning Barriers/Preferences - 11/16/19 1340      Learning Barriers/Preferences   Learning Barriers None    Learning Preferences None           General Cardiac Education Topics:  AED/CPR: - Group verbal and written instruction with the use of models to demonstrate the basic use of the AED with the basic ABC's of resuscitation.   Anatomy & Physiology of the Heart: - Group verbal and written instruction and models provide basic cardiac anatomy and physiology, with the coronary electrical and arterial systems. Review of Valvular disease and Heart Failure   Cardiac Procedures: - Group verbal and written instruction to review commonly prescribed medications for heart disease. Reviews the medication, class of the drug, and side effects. Includes the steps to properly store meds and maintain the prescription regimen. (beta blockers and nitrates)   Cardiac Medications I: - Group verbal and written instruction to  review commonly prescribed medications for heart disease. Reviews the medication, class of the drug, and side effects. Includes the steps to properly store meds and maintain the prescription regimen.   Cardiac Rehab from 02/08/2020 in [facility] Cardiac and Pulmonary Rehab  Date 01/04/20  Educator SB  Instruction Review Code 1- Verbalizes Understanding      Cardiac Medications II: -Group verbal and written instruction to review commonly prescribed medications  for heart disease. Reviews the medication, class of the drug, and side effects. (all other drug classes)    Go Sex-Intimacy & Heart Disease, Get SMART - Goal Setting: - Group verbal and written instruction through game format to discuss heart disease and the return to sexual intimacy. Provides group verbal and written material to discuss and apply goal setting through the application of the S.M.A.R.T. Method.   Other Matters of the Heart: - Provides group verbal, written materials and models to describe Stable Angina and Peripheral Artery. Includes description of the disease process and treatment options available to the cardiac patient.   Infection Prevention: - Provides verbal and written material to individual with discussion of infection control including proper hand washing and proper equipment cleaning during exercise session.   Cardiac Rehab from 02/08/2020 in [facility] Cardiac and Pulmonary Rehab  Date 11/16/19  Educator [facility]  Instruction Review Code 1- Verbalizes Understanding      Falls Prevention: - Provides verbal and written material to individual with discussion of falls prevention and safety.   Cardiac Rehab from 02/08/2020 in [facility] Cardiac and Pulmonary Rehab  Date 11/16/19  Educator [facility]  Instruction Review Code 1- Verbalizes Understanding      Other: -Provides group and verbal instruction on various topics (see comments)   Knowledge Questionnaire Score:  Knowledge Questionnaire Score - 11/21/19 0921      Knowledge  Questionnaire Score   Pre Score 25/26 Education Focus: Nutrition           Core Components/Risk Factors/Patient Goals at Admission:  Personal Goals and Risk Factors at Admission - 11/21/19 0922      Core Components/Risk Factors/Patient Goals on Admission    Weight Management Yes;Weight Loss    Intervention Weight Management: Develop a combined nutrition and exercise program designed to reach desired caloric intake, while maintaining appropriate intake of nutrient and fiber, sodium and fats, and appropriate energy expenditure required for the weight goal.;Weight Management: Provide education and appropriate resources to help participant work on and attain dietary goals.    Admit Weight 204 lb 6.4 oz (92.7 kg)    Goal Weight: Short Term 199 lb (90.3 kg)    Goal Weight: Long Term 194 lb (88 kg)    Expected Outcomes Short Term: Continue to assess and modify interventions until short term weight is achieved;Long Term: Adherence to nutrition and physical activity/exercise program aimed toward attainment of established weight goal;Weight Loss: Understanding of general recommendations for a balanced deficit meal plan, which promotes 1-2 lb weight loss per week and includes a negative energy balance of [phone] kcal/d;Understanding recommendations for meals to include 15-35% energy as protein, 25-35% energy from fat, 35-60% energy from carbohydrates, less than 280m of dietary cholesterol, 20-35 gm of total fiber daily;Understanding of distribution of calorie intake throughout the day with the consumption of 4-5 meals/snacks    Improve shortness of breath with ADL's Yes    Intervention Provide education, individualized exercise plan and daily activity instruction to help decrease symptoms of SOB with activities of daily living.    Expected Outcomes Short Term: Improve cardiorespiratory fitness to achieve a reduction of symptoms when performing ADLs;Long Term: Be able to perform more ADLs without symptoms  or delay the onset of symptoms    Diabetes Yes    Intervention Provide education about signs/symptoms and action to take for hypo/hyperglycemia.;Provide education about proper nutrition, including hydration, and aerobic/resistive exercise prescription along with prescribed medications to achieve blood glucose in normal ranges: Fasting glucose 65-99 mg/dL  Expected Outcomes Short Term: Participant verbalizes understanding of the signs/symptoms and immediate care of hyper/hypoglycemia, proper foot care and importance of medication, aerobic/resistive exercise and nutrition plan for blood glucose control.;Long Term: Attainment of HbA1C < 7%.    Lipids Yes    Intervention Provide education and support for participant on nutrition & aerobic/resistive exercise along with prescribed medications to achieve LDL <46m, HDL >492m    Expected Outcomes Short Term: Participant states understanding of desired cholesterol values and is compliant with medications prescribed. Participant is following exercise prescription and nutrition guidelines.;Long Term: Cholesterol controlled with medications as prescribed, with individualized exercise RX and with personalized nutrition plan. Value goals: LDL < 707mHDL > 40 mg.           Education:Diabetes - Individual verbal and written instruction to review signs/symptoms of diabetes, desired ranges of glucose level fasting, after meals and with exercise. Acknowledge that pre and post exercise glucose checks will be done for 3 sessions at entry of program.   [facility]  Date 11/16/19  Educator [facility] Panstruction Review Code 1- Verbalizes Understanding      Education: Know Your Numbers and Risk Factors: -Group verbal and written instruction about important numbers in your health.  Discussion of what are risk factors and how they play a role in the disease process.  Review of Cholesterol, Blood Pressure, Diabetes, and  BMI and the role they play in your overall health.   Core Components/Risk Factors/Patient Goals Review:   Goals and Risk Factor Review    Row Name 12/01/19 1314 01/04/20 1343 02/06/20 1350         Core Components/Risk Factors/Patient Goals Review   Personal Goals Review Weight Management/Obesity;Lipids;Diabetes;Improve shortness of breath with ADL's Weight Management/Obesity;Lipids;Diabetes;Improve shortness of breath with ADL's Weight Management/Obesity;Lipids;Diabetes;Improve shortness of breath with ADL's     Review Spoke to patient about their shortness of breath and what they can do to improve. Patient has been informed of breathing techniques when starting the program. Patient is informed to tell staff if they have had any med changes and that certain meds they are taking or not taking can be causing shortness of breath. HarJoneen Boers doing well in rehab.  His weight is down some and he would like to continue to lose more.  He is doing well with his pressures still.  Sugars have been good overall. His breathing is also getting better, but still has some episodes of SOB. HarJoneen Boersntinues to work on weight loss.  He has been cutting back on bread and pasta.  BG has been running much better.  He checks it at least twice per day.  Laast A1C was 7.1 % "the best its ever been".     Expected Outcomes Short: Attend [facility] regularly to improve shortness of breath with ADLs. Long: maintain independence with ADLs Short: Continue to work on weight loss Long; Continue to improve SOB. Short: continue to monitor portion sizes  Long: reach goal weight            Core Components/Risk Factors/Patient Goals at Discharge (Final Review):   Goals and Risk Factor Review - 02/06/20 1350      Core Components/Risk Factors/Patient Goals Review   Personal Goals Review Weight Management/Obesity;Lipids;Diabetes;Improve shortness of breath with ADL's    Review HarJoneen Boersntinues to work on weight loss.  He has been  cutting back on bread and pasta.  BG has been running much better.  He checks  it at least twice per day.  Laast A1C was 7.1 % "the best its ever been".    Expected Outcomes Short: continue to monitor portion sizes  Long: reach goal weight           ITP Comments:  ITP Comments    Row Name 11/16/19 1352 11/21/19 0915 11/23/19 0608 11/23/19 1308 12/21/19 0759   ITP Comments Virtual Visit completed. Patient informed on EP and RD appointment and 6 Minute walk test. Patient also informed of patient health questionnaires on My Chart. Patient Verbalizes understanding. Visit diagnosis can be found in [facility] 11/14/2019. Completed 6MWT and gym orientation.  Initial ITP created and sent for review to Dr. Emily Filbert, Medical Director. 30 Day review completed. Medical Director ITP review done, changes made as directed, and signed approval by Medical Director. First full day of exercise!  Patient was oriented to gym and equipment including functions, settings, policies, and procedures.  Patient's individual exercise prescription and treatment plan were reviewed.  All starting workloads were established based on the results of the 6 minute walk test done at initial orientation visit.  The plan for exercise progression was also introduced and progression will be customized based on patient's performance and goals. 30 Day review completed. Medical Director ITP review done, changes made as directed, and signed approval by Medical Director.   [place] Name 01/17/20 1412 01/18/20 0624 02/15/20 1609       ITP Comments Patient called, reports that he injured his back at home. He saw a provider today who informed him to take muscle relaxer's and rest the remaining of the week. Patient is not able to come to [facility] this week and plans to call next Monday to let us know how he is feeling and if able to attend rehab. 30 Day review completed. Medical Director ITP review done, changes made as directed, and signed approval by Medical  Director. 30 day review completed. ITP sent to Dr. Emily Filbert, Medical Director of Cardiac and Pulmonary Rehab. Continue with ITP unless changes are made by physician.            Comments: 30 day review

## 2020-02-16 ENCOUNTER — Encounter: Payer: 59 | Admitting: *Deleted

## 2020-02-16 ENCOUNTER — Other Ambulatory Visit: Payer: Self-pay

## 2020-02-16 DIAGNOSIS — I25118 Atherosclerotic heart disease of native coronary artery with other forms of angina pectoris: Secondary | ICD-10-CM

## 2020-02-16 NOTE — Progress Notes (Signed)
Daily Session Note  Patient Details  Name: Travis Fletcher. MRN: [id number] Date of Birth: 04/28/1956 Referring Provider:     Cardiac Rehab from 11/21/2019 in [facility] Cardiac and Pulmonary Rehab  Referring Provider Ganji, Jay MD      Encounter Date: 02/16/2020  Check In:  Session Check In - 02/16/20 1341      Check-In   Supervising physician immediately available to respond to emergencies See telemetry face sheet for immediately available ER MD    Location [facility]-Cardiac & Pulmonary Rehab    Staff Present Meredith Craven, RN BSN;Joseph Hood RCP,RRT,BSRT;Amanda Sommer, BA, ACSM CEP, Exercise Physiologist    Virtual Visit No    Medication changes reported     No    Fall or balance concerns reported    No    Warm-up and Cool-down Performed on first and last piece of equipment    Resistance Training Performed Yes    VAD Patient? No    PAD/SET Patient? No      Pain Assessment   Currently in Pain? No/denies              Social History   Tobacco Use  Smoking Status Never Smoker  Smokeless Tobacco Never Used    Goals Met:  Independence with exercise equipment Exercise tolerated well No report of cardiac concerns or symptoms Strength training completed today  Goals Unmet:  Not Applicable  Comments: Pt able to follow exercise prescription today without complaint.  Will continue to monitor for progression.    Dr. Mark Miller is Medical Director for [facility] and [facility]. 

## 2020-02-20 ENCOUNTER — Other Ambulatory Visit: Payer: Self-pay

## 2020-02-20 ENCOUNTER — Encounter: Payer: 59 | Admitting: *Deleted

## 2020-02-20 DIAGNOSIS — I25118 Atherosclerotic heart disease of native coronary artery with other forms of angina pectoris: Secondary | ICD-10-CM | POA: Diagnosis not present

## 2020-02-20 NOTE — Progress Notes (Signed)
Daily Session Note  Patient Details  Name: Court Gracia. MRN: [id number] Date of Birth: 1956-01-15 Referring Provider:     Cardiac Rehab from 11/21/2019 in [facility] Cardiac and Pulmonary Rehab  Referring Provider Adrian Prows MD      Encounter Date: 02/20/2020  Check In:  Session Check In - 02/20/20 1330      Check-In   Supervising physician immediately available to respond to emergencies See telemetry face sheet for immediately available ER MD    Location [facility]-Cardiac & Pulmonary Rehab    Staff Present Renita Papa, RN Vickki Hearing, BA, ACSM CEP, Exercise Physiologist;Kara Eliezer Bottom, MS Exercise Physiologist    Virtual Visit No    Medication changes reported     No    Fall or balance concerns reported    No    Warm-up and Cool-down Performed on first and last piece of equipment    Resistance Training Performed Yes    VAD Patient? No    PAD/SET Patient? No      Pain Assessment   Currently in Pain? No/denies              Social History   Tobacco Use  Smoking Status Never Smoker  Smokeless Tobacco Never Used    Goals Met:  Independence with exercise equipment Exercise tolerated well No report of cardiac concerns or symptoms Strength training completed today  Goals Unmet:  Not Applicable  Comments: Pt able to follow exercise prescription today without complaint.  Will continue to monitor for progression.    Dr. Emily Filbert is Medical Director for [place] and [facility].

## 2020-02-22 ENCOUNTER — Encounter: Payer: 59 | Admitting: *Deleted

## 2020-02-22 ENCOUNTER — Other Ambulatory Visit: Payer: Self-pay

## 2020-02-22 DIAGNOSIS — I25118 Atherosclerotic heart disease of native coronary artery with other forms of angina pectoris: Secondary | ICD-10-CM | POA: Diagnosis not present

## 2020-02-22 NOTE — Progress Notes (Signed)
Daily Session Note  Patient Details  Name: Travis Fletcher. MRN: [id number] Date of Birth: [birth year] Referring Provider:     Cardiac Rehab from 11/21/2019 in [facility] Cardiac and Pulmonary Rehab  Referring Provider Adrian Prows MD      Encounter Date: 02/22/2020  Check In:  Session Check In - 02/22/20 1333      Check-In   Supervising physician immediately available to respond to emergencies See telemetry face sheet for immediately available ER MD    Location [facility]-Cardiac & Pulmonary Rehab    Staff Present Renita Papa, RN BSN;Joseph Lou Miner, [place] Exercise Physiologist    Virtual Visit No    Medication changes reported     No    Fall or balance concerns reported    No    Warm-up and Cool-down Performed on first and last piece of equipment    Resistance Training Performed Yes    VAD Patient? No    PAD/SET Patient? No      Pain Assessment   Currently in Pain? No/denies              Social History   Tobacco Use  Smoking Status Never Smoker  Smokeless Tobacco Never Used    Goals Met:  Independence with exercise equipment Exercise tolerated well No report of cardiac concerns or symptoms Strength training completed today  Goals Unmet:  Not Applicable  Comments: Pt able to follow exercise prescription today without complaint.  Will continue to monitor for progression.    Dr. Emily Filbert is Medical Director for [place] and [facility].

## 2020-02-23 ENCOUNTER — Other Ambulatory Visit: Payer: Self-pay

## 2020-02-23 ENCOUNTER — Encounter: Payer: 59 | Admitting: *Deleted

## 2020-02-23 DIAGNOSIS — I25118 Atherosclerotic heart disease of native coronary artery with other forms of angina pectoris: Secondary | ICD-10-CM | POA: Diagnosis not present

## 2020-02-23 NOTE — Progress Notes (Signed)
Daily Session Note  Patient Details  Name: Travis Fletcher. MRN: [id number] Date of Birth: 1956-03-09 Referring Provider:     Cardiac Rehab from 11/21/2019 in [facility] Cardiac and Pulmonary Rehab  Referring Provider Adrian Prows MD      Encounter Date: 02/23/2020  Check In:  Session Check In - 02/23/20 1350      Check-In   Supervising physician immediately available to respond to emergencies See telemetry face sheet for immediately available ER MD    Location [facility]-Cardiac & Pulmonary Rehab    Staff Present Renita Papa, RN BSN;Jessica Luan Pulling, MA, RCEP, CCRP, CCET;Joseph Morrow RCP,RRT,BSRT    Virtual Visit No    Medication changes reported     No    Fall or balance concerns reported    No    Warm-up and Cool-down Performed on first and last piece of equipment    Resistance Training Performed Yes    VAD Patient? No    PAD/SET Patient? No      Pain Assessment   Currently in Pain? No/denies              Social History   Tobacco Use  Smoking Status Never Smoker  Smokeless Tobacco Never Used    Goals Met:  Independence with exercise equipment Exercise tolerated well No report of cardiac concerns or symptoms Strength training completed today  Goals Unmet:  Not Applicable  Comments: Pt able to follow exercise prescription today without complaint.  Will continue to monitor for progression.    Dr. Emily Filbert is Medical Director for [facility] and [facility].

## 2020-02-27 ENCOUNTER — Other Ambulatory Visit: Payer: Self-pay

## 2020-02-27 ENCOUNTER — Encounter: Payer: 59 | Admitting: *Deleted

## 2020-02-27 DIAGNOSIS — I25118 Atherosclerotic heart disease of native coronary artery with other forms of angina pectoris: Secondary | ICD-10-CM

## 2020-02-27 NOTE — Progress Notes (Signed)
Daily Session Note  Patient Details  Name: Travis Fletcher. MRN: [id number] Date of Birth: 12-11-1955 Referring Provider:     Cardiac Rehab [facility] 11/21/2019 [facility]  Referring Provider Adrian Prows MD      Encounter Date: 02/27/2020  Check In:  Session Check In - 02/27/20 1330      Check-In   Supervising physician immediately available to respond to emergencies See telemetry face sheet for immediately available ER MD    Location [facility]-Cardiac & Pulmonary Rehab    Staff Present Earlean Shawl, BS, ACSM CEP, Exercise Physiologist;Kamalani Mastro Sherryll Burger, RN Margurite Auerbach, MS Exercise Physiologist    Virtual Visit No    Medication changes reported     No    Fall or balance concerns reported    No    Warm-up and Cool-down Performed on first and last piece of equipment    Resistance Training Performed Yes    VAD Patient? No    PAD/SET Patient? No      Pain Assessment   Currently in Pain? No/denies              Social History   Tobacco Use  Smoking Status Never Smoker  Smokeless Tobacco Never Used    Goals Met:  Independence with exercise equipment Exercise tolerated well No report of cardiac concerns or symptoms Strength training completed today  Goals Unmet:  Not Applicable  Comments: Pt able to follow exercise prescription today without complaint.  Will continue to monitor for progression.    Dr. Emily Filbert is Medical Director for [facility].

## 2020-02-28 NOTE — Patient Instructions (Signed)
Discharge Patient Instructions  Patient Details  Name: Travis Fletcher. MRN: [id number] Date of Birth: 1955-09-09 Referring Provider:  Adrian Prows, MD   Number of Visits: 91  Reason for Discharge:  Patient reached a stable level of exercise. Patient independent in their exercise. Patient has met program and personal goals.  Smoking History:  Social History   Tobacco Use  Smoking Status Never Smoker  Smokeless Tobacco Never Used    Diagnosis:  Coronary artery disease of native artery of native heart with stable angina pectoris [facility])  Initial Exercise Prescription:  Initial Exercise Prescription - 11/21/19 0900      Date of Initial Exercise RX and Referring Provider   Date 11/21/19    Referring Provider Adrian Prows MD      Treadmill   MPH 2.2    Grade 1.5    Minutes 15    METs 3.14      NuStep   Level 3    SPM 80    Minutes 15    METs 3      REL-XR   Level 3    Speed 50    Minutes 15    METs 3      Biostep-RELP   Level 3    SPM 50    Minutes 15    METs 3      Prescription Details   Frequency (times per week) 3    Duration Progress to 30 minutes of continuous aerobic without signs/symptoms of physical distress      Intensity   THRR 40-80% of Max Heartrate 112-143    Ratings of Perceived Exertion 11-13    Perceived Dyspnea 0-4      Progression   Progression Continue to progress workloads to maintain intensity without signs/symptoms of physical distress.      Resistance Training   Training Prescription Yes    Weight 4 lb    Reps 10-15           Discharge Exercise Prescription (Final Exercise Prescription Changes):  Exercise Prescription Changes - 02/22/20 1400      Response to Exercise   Blood Pressure (Admit) 116/56    Blood Pressure (Exercise) 132/54    Blood Pressure (Exit) 120/64    Heart Rate (Admit) 90 bpm    Heart Rate (Exercise) 118 bpm    Heart Rate (Exit) 105 bpm    Rating of Perceived Exertion (Exercise) 11     Symptoms none    Duration Continue with 30 min of aerobic exercise without signs/symptoms of physical distress.    Intensity THRR unchanged      Progression   Progression Continue to progress workloads to maintain intensity without signs/symptoms of physical distress.    Average METs 3.31      Resistance Training   Training Prescription Yes    Weight 8 lb    Reps 10-15      Interval Training   Interval Training No      Treadmill   MPH 2.8    Grade 2    Minutes 15    METs 3.93      NuStep   Level 3    Minutes 15    METs 3      REL-XR   Level 9    Minutes 15      Biostep-RELP   Level 4    Minutes 15    METs 3      Home Exercise Plan   Plans to continue exercise  at Home (comment)   walking, treadmill   Frequency Add 2 additional days to program exercise sessions.    Initial Home Exercises Provided 01/04/20           Functional Capacity:  6 Minute Walk    Row Name 11/21/19 0916 02/20/20 1613       6 Minute Walk   Phase Initial Discharge    Distance 1210 feet 1510 feet    Distance % Change -- 24.7 %    Distance Feet Change -- 300 ft    Walk Time 6 minutes 6 minutes    # of Rest Breaks 0 0    MPH 2.29 2.85    METS 3.23 3.91    RPE 7 7    Perceived Dyspnea  -- 0    VO2 Peak 11.28 13.6    Symptoms Yes (comment) No    Comments angina 2/10 --    Resting HR 80 bpm 90 bpm    Resting BP 122/70 116/56    Resting Oxygen Saturation  97 % --    Exercise Oxygen Saturation  during 6 min walk 96 % 96 %    Max Ex. HR 105 bpm 117 bpm    Max Ex. BP 128/64 132/54    2 Minute Post BP 122/74 --           Quality of Life:  Quality of Life - 02/23/20 1436      Quality of Life Scores   Health/Function Pre 12.47 %    Health/Function Post 21.86 %    Health/Function % Change 75.3 %    Socioeconomic Pre 20 %    Socioeconomic Post 27.25 %    Socioeconomic % Change  36.25 %    Psych/Spiritual Pre 19.71 %    Psych/Spiritual Post 22.64 %    Psych/Spiritual % Change  14.87 %    Family Pre 29.5 %    Family Post 27.3 %    Family % Change -7.46 %    GLOBAL Pre 18.07 %    GLOBAL Post 23.89 %    GLOBAL % Change 32.21 %           Goals reviewed with patient; copy given to patient.

## 2020-02-29 ENCOUNTER — Encounter: Payer: 59 | Admitting: *Deleted

## 2020-02-29 ENCOUNTER — Other Ambulatory Visit: Payer: Self-pay

## 2020-02-29 DIAGNOSIS — I25118 Atherosclerotic heart disease of native coronary artery with other forms of angina pectoris: Secondary | ICD-10-CM

## 2020-02-29 NOTE — Progress Notes (Signed)
Cardiac Individual Treatment Plan  Patient Details  Name: Travis Fletcher. MRN: [id number] Date of Birth: Jun 10, 1955 Referring Provider:     Cardiac Rehab from 11/21/2019 in [facility] Cardiac and Pulmonary Rehab  Referring Provider Adrian Prows MD      Initial Encounter Date:    Cardiac Rehab from 11/21/2019 in [facility] Cardiac and Pulmonary Rehab  Date 11/21/19      Visit Diagnosis: Coronary artery disease of native artery of native heart with stable angina pectoris ([place])  Patient's Home Medications on Admission:  Current Outpatient Medications:  .  acetaminophen (TYLENOL) 325 MG tablet, Take 650 mg by mouth every 6 (six) hours as needed for moderate pain or headache., Disp: , Rfl:  .  albuterol (VENTOLIN HFA) 108 (90 Base) MCG/ACT inhaler, Inhale 1 puff into the lungs every 6 (six) hours as needed for wheezing or shortness of breath. , Disp: , Rfl:  .  amLODipine (NORVASC) 5 MG tablet, TAKE 1 TABLET BY MOUTH ONCE A DAY, Disp: 30 tablet, Rfl: 6 .  aspirin 81 MG tablet, Take 1 tablet (81 mg total) by mouth daily., Disp: , Rfl:  .  clopidogrel (PLAVIX) 75 MG tablet, TAKE 1 TABLET BY MOUTH ONCE DAILY, Disp: 30 tablet, Rfl: 3 .  cyclobenzaprine (FLEXERIL) 10 MG tablet, Take 1 tablet (10 mg total) by mouth 2 (two) times daily as needed for muscle spasms., Disp: 20 tablet, Rfl: 0 .  guaiFENesin (MUCINEX) 600 MG 12 hr tablet, Take 600 mg by mouth 2 (two) times daily as needed (congestion)., Disp: , Rfl:  .  ibuprofen (ADVIL) 600 MG tablet, Take 1 tablet (600 mg total) by mouth every 6 (six) hours as needed., Disp: 30 tablet, Rfl: 0 .  insulin aspart (NOVOLOG) 100 UNIT/ML injection, Inject into the skin See admin instructions. Uses via Insulin Pump, Disp: , Rfl:  .  losartan (COZAAR) 25 MG tablet, TAKE HALF A TABLET BY MOUTH DAILY (Patient taking differently: Take 12.5 mg by mouth daily. ), Disp: 90 tablet, Rfl: 0 .  MELATONIN PO, Take 1 tablet by mouth at bedtime as needed (sleep)., Disp: ,  Rfl:  .  metoprolol tartrate (LOPRESSOR) 25 MG tablet, TAKE 1/2 TABLET BY MOUTH TWICE A DAY AS NEEDED, Disp: 180 tablet, Rfl: 0 .  Multiple Vitamins-Minerals (CENTRUM SILVER 50+MEN PO), Take 1 tablet by mouth daily., Disp: , Rfl:  .  nitroGLYCERIN (NITROSTAT) 0.4 MG SL tablet, Place 1 tablet (0.4 mg total) under the tongue every 5 (five) minutes as needed for chest pain., Disp: 30 tablet, Rfl: 6 .  ranolazine (RANEXA) 500 MG 12 hr tablet, Take 1 tablet (500 mg total) by mouth 2 (two) times daily., Disp: 60 tablet, Rfl: 1  Past Medical History: Past Medical History:  Diagnosis Date  . Anginal pain ([place])   . Arthritis    "hands" (07/11/2014)  . CAD (coronary artery disease), native coronary artery 07/24/2018  . Coronary artery disease   . High cholesterol   . Hypercholesteremia 07/24/2018  . Insulin dependent diabetes mellitus   . Kidney stones    "passed them all"  . Neuromuscular disorder (H[facility])    neuropathy  . Prostate cancer ([place]) 2010  . Statin intolerance 07/24/2018  . Uncontrolled diabetes mellitus ([place]) 07/24/2018    Tobacco Use: Social History   Tobacco Use  Smoking Status Never Smoker  Smokeless Tobacco Never Used    Labs: Recent Review Scientist, physiological    Labs for ITP Cardiac and Pulmonary Rehab Latest Ref Rng &  Units 02/25/2008 01/04/2011 01/05/2011 06/15/2015 11/25/2019   Cholestrol 100 - 199 mg/dL - - 161 131 273(H)   LDLCALC 0 - 99 mg/dL - - 87 61 195(H)   HDL >39 mg/dL - - 51 55 55   Trlycerides 0 - 149 mg/dL - - 114 76 127   Hemoglobin A1c <5.7 % - 8.0(H) - - -   TCO2 - 28 - - - -       Exercise Target Goals: Exercise Program Goal: Individual exercise prescription set using results from initial 6 min walk test and THRR while considering  patient's activity barriers and safety.   Exercise Prescription Goal: Initial exercise prescription builds to 30-45 minutes a day of aerobic activity, 2-3 days per week.  Home exercise guidelines will be given to patient during  program as part of exercise prescription that the participant will acknowledge.   Education: Aerobic Exercise & Resistance Training: - Gives group verbal and written instruction on the various components of exercise. Focuses on aerobic and resistive training programs and the benefits of this training and how to safely progress through these programs..   Education: Exercise & Equipment Safety: - Individual verbal instruction and demonstration of equipment use and safety with use of the equipment.   Cardiac Rehab from 02/08/2020 in [facility] Cardiac and Pulmonary Rehab  Date 11/16/19  Educator [facility]  Instruction Review Code 1- Verbalizes Understanding      Education: Exercise Physiology & General Exercise Guidelines: - Group verbal and written instruction with models to review the exercise physiology of the cardiovascular system and associated critical values. Provides general exercise guidelines with specific guidelines to those with heart or lung disease.    Cardiac Rehab from 02/08/2020 in [facility] Cardiac and Pulmonary Rehab  Date 12/21/19  Educator AS  Instruction Review Code 1- Verbalizes Understanding      Education: Flexibility, Balance, Mind/Body Relaxation: Provides group verbal/written instruction on the benefits of flexibility and balance training, including mind/body exercise modes such as yoga, pilates and tai chi.  Demonstration and skill practice provided.   Cardiac Rehab from 02/08/2020 in [facility] Cardiac and Pulmonary Rehab  Date 02/08/20  Educator AS  Instruction Review Code 1- Verbalizes Understanding      Activity Barriers & Risk Stratification:  Activity Barriers & Cardiac Risk Stratification - 11/21/19 0916      Activity Barriers & Cardiac Risk Stratification   Activity Barriers Joint Problems;Deconditioning;Muscular Weakness;Chest Pain/Angina   shoulder problems   Cardiac Risk Stratification Moderate           6 Minute Walk:  6 Minute Walk    Row Name 11/21/19 0916  02/20/20 1613       6 Minute Walk   Phase Initial Discharge    Distance 1210 feet 1510 feet    Distance % Change -- 24.7 %    Distance Feet Change -- 300 ft    Walk Time 6 minutes 6 minutes    # of Rest Breaks 0 0    MPH 2.29 2.85    METS 3.23 3.91    RPE 7 7    Perceived Dyspnea  -- 0    VO2 Peak 11.28 13.6    Symptoms Yes (comment) No    Comments angina 2/10 --    Resting HR 80 bpm 90 bpm    Resting BP 122/70 116/56    Resting Oxygen Saturation  97 % --    Exercise Oxygen Saturation  during 6 min walk 96 % 96 %  Max Ex. HR 105 bpm 117 bpm    Max Ex. BP 128/64 132/54    2 Minute Post BP 122/74 --           Oxygen Initial Assessment:   Oxygen Re-Evaluation:   Oxygen Discharge (Final Oxygen Re-Evaluation):   Initial Exercise Prescription:  Initial Exercise Prescription - 11/21/19 0900      Date of Initial Exercise RX and Referring Provider   Date 11/21/19    Referring Provider Adrian Prows MD      Treadmill   MPH 2.2    Grade 1.5    Minutes 15    METs 3.14      NuStep   Level 3    SPM 80    Minutes 15    METs 3      REL-XR   Level 3    Speed 50    Minutes 15    METs 3      Biostep-RELP   Level 3    SPM 50    Minutes 15    METs 3      Prescription Details   Frequency (times per week) 3    Duration Progress to 30 minutes of continuous aerobic without signs/symptoms of physical distress      Intensity   THRR 40-80% of Max Heartrate 112-143    Ratings of Perceived Exertion 11-13    Perceived Dyspnea 0-4      Progression   Progression Continue to progress workloads to maintain intensity without signs/symptoms of physical distress.      Resistance Training   Training Prescription Yes    Weight 4 lb    Reps 10-15           Perform Capillary Blood Glucose checks as needed.  Exercise Prescription Changes:  Exercise Prescription Changes    Row Name 11/21/19 0900 11/28/19 1600 12/13/19 1300 12/26/19 1500 01/04/20 1300     Response to  Exercise   Blood Pressure (Admit) 122/70 112/54 130/62 118/60 --   Blood Pressure (Exercise) 128/64 162/64 122/64 150/56 --   Blood Pressure (Exit) 122/74 124/64 124/66 106/58 --   Heart Rate (Admit) 80 bpm 85 bpm 81 bpm 91 bpm --   Heart Rate (Exercise) 105 bpm 116 bpm 122 bpm 137 bpm --   Heart Rate (Exit) 77 bpm 92 bpm 96 bpm 109 bpm --   Oxygen Saturation (Admit) 97 % -- -- -- --   Oxygen Saturation (Exercise) 96 % -- -- -- --   Rating of Perceived Exertion (Exercise) 7 12 13 15  --   Symptoms chest pain 2/10 none none none --   Comments walk test results -- -- -- --   Duration -- Continue with 30 min of aerobic exercise without signs/symptoms of physical distress. Progress to 30 minutes of  aerobic without signs/symptoms of physical distress Continue with 30 min of aerobic exercise without signs/symptoms of physical distress. --   Intensity -- THRR unchanged THRR unchanged THRR unchanged --     Progression   Progression -- Continue to progress workloads to maintain intensity without signs/symptoms of physical distress. Continue to progress workloads to maintain intensity without signs/symptoms of physical distress. Continue to progress workloads to maintain intensity without signs/symptoms of physical distress. --   Average METs -- 3.04 3.07 4.71 --     Resistance Training   Training Prescription -- Yes Yes Yes --   Weight -- 4 lb 4 lb 8 lb --   Reps -- 10-15 10-15  10-15 --     Interval Training   Interval Training -- No No No --     Treadmill   MPH -- 2.2 2.5 3 --   Grade -- 1.5 1.5 2.5 --   Minutes -- 15 15 15  --   METs -- 3.14 3.43 4.33 --     NuStep   Level -- 3 5 9  --   Minutes -- 15 15 15  --   METs -- 2.5 3.6 6 --     Elliptical   Level -- -- -- 1 --   Speed -- -- -- 2.8 --   Minutes -- -- -- 15 --     REL-XR   Level -- 3 5 -- --   Minutes -- 15 15 -- --   METs -- 3.5 2.4 -- --     Biostep-RELP   Level -- 3 4 6  --   Minutes -- 15 15 15  --   METs -- 3 3 4   --     Home Exercise Plan   Plans to continue exercise at -- -- -- -- Home (comment)  walking, treadmill   Frequency -- -- -- -- Add 2 additional days to program exercise sessions.   Initial Home Exercises Provided -- -- -- -- 01/04/20   Row Name 01/11/20 1300 01/25/20 1500 02/07/20 0900 02/22/20 1400       Response to Exercise   Blood Pressure (Admit) 130/58 132/58 124/62 116/56    Blood Pressure (Exercise) 156/56 164/58 130/62 132/54    Blood Pressure (Exit) 126/70 110/58 120/64 120/64    Heart Rate (Admit) 64 bpm 88 bpm 81 bpm 90 bpm    Heart Rate (Exercise) 136 bpm 133 bpm 117 bpm 118 bpm    Heart Rate (Exit) 108 bpm 99 bpm 80 bpm 105 bpm    Rating of Perceived Exertion (Exercise) 13 13 11 11     Symptoms none none none none    Duration Continue with 30 min of aerobic exercise without signs/symptoms of physical distress. Continue with 30 min of aerobic exercise without signs/symptoms of physical distress. Continue with 30 min of aerobic exercise without signs/symptoms of physical distress. Continue with 30 min of aerobic exercise without signs/symptoms of physical distress.    Intensity THRR unchanged THRR unchanged THRR unchanged THRR unchanged      Progression   Progression Continue to progress workloads to maintain intensity without signs/symptoms of physical distress. Continue to progress workloads to maintain intensity without signs/symptoms of physical distress. Continue to progress workloads to maintain intensity without signs/symptoms of physical distress. Continue to progress workloads to maintain intensity without signs/symptoms of physical distress.    Average METs 4.2 4.99 2.45 3.31      Resistance Training   Training Prescription Yes Yes Yes Yes    Weight 8 lb 8 lb 8 lb 8 lb    Reps 10-15 10-15 10-15 10-15      Interval Training   Interval Training No No No No      Treadmill   MPH -- 2.8 -- 2.8    Grade -- 3.5 -- 2    Minutes -- 15 -- 15    METs -- 4.49 -- 3.93       NuStep   Level 8 -- 5 3    SPM 80 -- 80 --    Minutes 15 -- 15 15    METs 5.4 -- 2.9 3      REL-XR   Level -- 10 -- 9  Minutes -- 15 -- 15    METs -- 5.5 -- --      Biostep-RELP   Level 6 -- 4 4    SPM 50 -- 50 --    Minutes 15 -- 15 15    METs 3 -- 2 3      Home Exercise Plan   Plans to continue exercise at Home (comment)  walking, treadmill Home (comment)  walking, treadmill Home (comment)  walking, treadmill Home (comment)  walking, treadmill    Frequency Add 2 additional days to program exercise sessions. Add 2 additional days to program exercise sessions. Add 2 additional days to program exercise sessions. Add 2 additional days to program exercise sessions.    Initial Home Exercises Provided 01/04/20 01/04/20 01/04/20 01/04/20           Exercise Comments:  Exercise Comments    Row Name 01/25/20 1418           Exercise Comments Pt completed 1 round of exercise and left to go home due to back pain.              Exercise Goals and Review:  Exercise Goals    Row Name 11/21/19 0919             Exercise Goals   Increase Physical Activity Yes       Intervention Provide advice, education, support and counseling about physical activity/exercise needs.;Develop an individualized exercise prescription for aerobic and resistive training based on initial evaluation findings, risk stratification, comorbidities and participant's personal goals.       Expected Outcomes Short Term: Attend rehab on a regular basis to increase amount of physical activity.;Long Term: Add in home exercise to make exercise part of routine and to increase amount of physical activity.;Long Term: Exercising regularly at least 3-5 days a week.       Increase Strength and Stamina Yes       Intervention Provide advice, education, support and counseling about physical activity/exercise needs.;Develop an individualized exercise prescription for aerobic and resistive training based on initial  evaluation findings, risk stratification, comorbidities and participant's personal goals.       Expected Outcomes Short Term: Increase workloads from initial exercise prescription for resistance, speed, and METs.;Short Term: Perform resistance training exercises routinely during rehab and add in resistance training at home;Long Term: Improve cardiorespiratory fitness, muscular endurance and strength as measured by increased METs and functional capacity (6MWT)       Able to understand and use rate of perceived exertion (RPE) scale Yes       Intervention Provide education and explanation on how to use RPE scale       Expected Outcomes Short Term: Able to use RPE daily in rehab to express subjective intensity level;Long Term:  Able to use RPE to guide intensity level when exercising independently       Able to understand and use Dyspnea scale Yes       Intervention Provide education and explanation on how to use Dyspnea scale       Expected Outcomes Long Term: Able to use Dyspnea scale to guide intensity level when exercising independently;Short Term: Able to use Dyspnea scale daily in rehab to express subjective sense of shortness of breath during exertion       Knowledge and understanding of Target Heart Rate Range (THRR) Yes       Intervention Provide education and explanation of THRR including how the numbers were predicted and where they are located  for reference       Expected Outcomes Short Term: Able to state/look up THRR;Short Term: Able to use daily as guideline for intensity in rehab;Long Term: Able to use THRR to govern intensity when exercising independently       Able to check pulse independently Yes       Intervention Provide education and demonstration on how to check pulse in carotid and radial arteries.;Review the importance of being able to check your own pulse for safety during independent exercise       Expected Outcomes Short Term: Able to explain why pulse checking is important  during independent exercise;Long Term: Able to check pulse independently and accurately       Understanding of Exercise Prescription Yes       Intervention Provide education, explanation, and written materials on patient's individual exercise prescription       Expected Outcomes Short Term: Able to explain program exercise prescription;Long Term: Able to explain home exercise prescription to exercise independently              Exercise Goals Re-Evaluation :  Exercise Goals Re-Evaluation    Row Name 11/23/19 1309 11/28/19 1621 12/01/19 1310 12/13/19 1405 12/26/19 1520     Exercise Goal Re-Evaluation   Exercise Goals Review Increase Physical Activity;Able to understand and use rate of perceived exertion (RPE) scale;Knowledge and understanding of Target Heart Rate Range (THRR);Understanding of Exercise Prescription;Increase Strength and Stamina;Able to check pulse independently Increase Physical Activity;Increase Strength and Stamina;Understanding of Exercise Prescription Increase Physical Activity;Increase Strength and Stamina Increase Physical Activity;Increase Strength and Stamina;Understanding of Exercise Prescription Increase Physical Activity;Increase Strength and Stamina;Understanding of Exercise Prescription   Comments Reviewed RPE and dyspnea scales, THR and program prescription with pt today.  Pt voiced understanding and was given a copy of goals to take home. [name] is off to a good start in rehab.  He has completed his first three full days of exercise.  He is already up to 3.5 METs onthe XR.  We will continue to monitor his progress. Patient is not doing any home exercise at home as of yet. He is doing house work at this time and does not have enough time to put in exercise other than rehab. [name] is doing well. He has already reached level 4 on Biostep and level 5 on the NuStep. Will continue to monitor progress. [name] continues to do well in rehab.  He started to try out the ellipitcal  and is off to a good start so far.  He ia up to level 9 on the T4.  We will continue monitor his progress.   Expected Outcomes Short: Use RPE daily to regulate intensity. Long: Follow program prescription in THR. Short; Continue to attend rehab regularly Long: Continue to follow program prescription Short: work on home xercise with EP's. Long: maintain a home exercise routine. Short: Increase handweights Long: Increase stamina/endurance and aim to progress MET level Short: Continue to improve stamina on the elliptical  Long; Continue to improve METs   Row Name 01/04/20 1339 01/11/20 1343 01/25/20 1505 02/06/20 1348 02/22/20 1417     Exercise Goal Re-Evaluation   Exercise Goals Review Increase Physical Activity;Increase Strength and Stamina;Understanding of Exercise Prescription;Able to understand and use rate of perceived exertion (RPE) scale;Knowledge and understanding of Target Heart Rate Range (THRR);Able to check pulse independently Increase Physical Activity;Increase Strength and Stamina;Able to understand and use rate of perceived exertion (RPE) scale;Knowledge and understanding of Target Heart Rate Range (THRR) Increase Physical  Activity;Increase Strength and Stamina;Knowledge and understanding of Target Heart Rate Range (THRR) Increase Physical Activity;Increase Strength and Stamina;Knowledge and understanding of Target Heart Rate Range (THRR) Increase Physical Activity;Increase Strength and Stamina;Knowledge and understanding of Target Heart Rate Range (THRR)   Comments aReviewed home exercise with pt today.  Pt plans to walk and use treadmill at home for exercise.  Reviewed THR, pulse, RPE, sign and symptoms, pulse oximetery and when to call 911 or MD.  Also discussed weather considerations and indoor options.  Pt voiced understanding.  He is feeling like his strength and stamina are getting better.  Last week he was able to do the whole time on the elliptical. [name] attends consistently and works  in correct THR range.  He has alos increased to 8 lb weights for strength training. [name] has missed a few visits due to back pain.  He was able to return to class today.  We will continue to monitor for progression. [name] still has some trouble with his back.  He has seen his Dr and takes Flexaril and ibuprofen.  He also alternates heat and ice.  He is doing that a couple times a day. [name] is nearing graduation. He improved his post 6MWT by 300 ft!!  He is planning to walk and use his treadmill at home after graduation.   Expected Outcomes Short: Continue to improve stamina Long; Continue to exercise at home. Short:  continue to attend consistently Long:  improve MET level Short: Protect back Long: Continue to improve stamina. Short:  follow Dr orders for back pain Long:  continue to build stamina Short: Graduate  Long: Continue to exercise independently   Britt Name 02/23/20 1442             Exercise Goal Re-Evaluation   Exercise Goals Review Increase Physical Activity;Increase Strength and Stamina;Understanding of Exercise Prescription       Comments [name] is going to continue to walk at home on his treadmill.  He is also now set up to join [employer] in [place] with his friend.       Expected Outcomes Short: Graduate  Long: Continue to exercise independently              Discharge Exercise Prescription (Final Exercise Prescription Changes):  Exercise Prescription Changes - 02/22/20 1400      Response to Exercise   Blood Pressure (Admit) 116/56    Blood Pressure (Exercise) 132/54    Blood Pressure (Exit) 120/64    Heart Rate (Admit) 90 bpm    Heart Rate (Exercise) 118 bpm    Heart Rate (Exit) 105 bpm    Rating of Perceived Exertion (Exercise) 11    Symptoms none    Duration Continue with 30 min of aerobic exercise without signs/symptoms of physical distress.    Intensity THRR unchanged      Progression   Progression Continue to progress workloads to maintain  intensity without signs/symptoms of physical distress.    Average METs 3.31      Resistance Training   Training Prescription Yes    Weight 8 lb    Reps 10-15      Interval Training   Interval Training No      Treadmill   MPH 2.8    Grade 2    Minutes 15    METs 3.93      NuStep   Level 3    Minutes 15    METs 3      REL-XR  Level 9    Minutes 15      Biostep-RELP   Level 4    Minutes 15    METs 3      Home Exercise Plan   Plans to continue exercise at Home (comment)   walking, treadmill   Frequency Add 2 additional days to program exercise sessions.    Initial Home Exercises Provided 01/04/20           Nutrition:  Target Goals: Understanding of nutrition guidelines, daily intake of sodium <1533m, cholesterol <2060m calories 30% from fat and 7% or less from saturated fats, daily to have 5 or more servings of fruits and vegetables.  Education: Controlling Sodium/Reading Food Labels -Group verbal and written material supporting the discussion of sodium use in heart healthy nutrition. Review and explanation with models, verbal and written materials for utilization of the food label.   Education: General Nutrition Guidelines/Fats and Fiber: -Group instruction provided by verbal, written material, models and posters to present the general guidelines for heart healthy nutrition. Gives an explanation and review of dietary fats and fiber.   Biometrics:  Pre Biometrics - 11/21/19 0920      Pre Biometrics   Height 5' 10.5" (1.791 m)    Weight 204 lb 6.4 oz (92.7 kg)    BMI (Calculated) 28.9    Single Leg Stand 30 seconds            Nutrition Therapy Plan and Nutrition Goals:  Nutrition Therapy & Goals - 12/26/19 1506      Personal Nutrition Goals   Comments Declined nutrition intervention, will continue to check in with pt.           Nutrition Assessments:  Nutrition Assessments - 02/23/20 1436      MEDFICTS Scores   Post Score 23            MEDIFICTS Score Key:          ?70 Need to make dietary changes          40-70 Heart Healthy Diet         ? 40 Therapeutic Level Cholesterol Diet  Nutrition Goals Re-Evaluation:  Nutrition Goals Re-Evaluation    RoRegino Ramirezame 12/01/19 1316 01/04/20 1345 01/25/20 1534 02/22/20 1333 02/23/20 1444     Goals   Current Weight 205 lb (93 kg) -- -- -- --   Nutrition Goal Patient would like to get down to 180lbs. Reduce carbs Continue with current chnages, did not want to make any goals at this time. HaJoneen Boersid not want to make any goals at this time and would like to continue with his diet --   Comment HaJoneen Boerstates his eating habit are not bad. He sometimes over eats but not often. He is willing to change his diet and meet with the dietician. HaJoneen Boerss struggling with is diet. He has been diligent about cutting back his carbs and sugars.  Otherwisde he is doing well and getting enough protein. Checked in with HaJoneen Boershe still doesn't feel like he needs anything from me and would like to continue with current changes. HaJoneen Boerss still trying to eat healthy with some "slip ups" for vacation and special occasions, but would like to get back on track. Typically he reports having a light breakfast and recently been having salads with grilled chicken for dinner. HaJoneen Boerseports he has been doing well and doesn't feel like he needs any further help at this time before discharge. Continue to follow heart healthy  diet   Expected Outcome Short: meet with Dietician. Long: form a nutrition plan that meets his needs. Shot: Continue to work on diet Long: Heart healthy. Continue with current chnages, did not want to make any goals at this time. Continue to make heart healthy choices after leaving rehab Continue to follow heart healthy diet          Nutrition Goals Discharge (Final Nutrition Goals Re-Evaluation):  Nutrition Goals Re-Evaluation - 02/23/20 1444      Goals   Comment Continue to follow heart healthy diet     Expected Outcome Continue to follow heart healthy diet           Psychosocial: Target Goals: Acknowledge presence or absence of significant depression and/or stress, maximize coping skills, provide positive support system. Participant is able to verbalize types and ability to use techniques and skills needed for reducing stress and depression.   Education: Depression - Provides group verbal and written instruction on the correlation between heart/lung disease and depressed mood, treatment options, and the stigmas associated with seeking treatment.   Education: Sleep Hygiene -Provides group verbal and written instruction about how sleep can affect your health.  Define sleep hygiene, discuss sleep cycles and impact of sleep habits. Review good sleep hygiene tips.     Education: Stress and Anxiety: - Provides group verbal and written instruction about the health risks of elevated stress and causes of high stress.  Discuss the correlation between heart/lung disease and anxiety and treatment options. Review healthy ways to manage with stress and anxiety.    Initial Review & Psychosocial Screening:  Initial Psych Review & Screening - 11/16/19 1338      Initial Review   Current issues with Current Stress Concerns    Source of Stress Concerns Occupation;Chronic Illness    Comments [name] has not been able to work since December due to his health and is making him stressed.      Family Dynamics   Good Support System? Yes    Comments He can look to his wife, son, daughter and sisters for support.      Barriers   Psychosocial barriers to participate in program There are no identifiable barriers or psychosocial needs.;The patient should benefit from training in stress management and relaxation.      Screening Interventions   Interventions Encouraged to exercise;To provide support and resources with identified psychosocial needs;Provide feedback about the scores to participant     Expected Outcomes Short Term goal: Utilizing psychosocial counselor, staff and physician to assist with identification of specific Stressors or current issues interfering with healing process. Setting desired goal for each stressor or current issue identified.;Long Term Goal: Stressors or current issues are controlled or eliminated.;Short Term goal: Identification and review with participant of any Quality of Life or Depression concerns found by scoring the questionnaire.;Long Term goal: The participant improves quality of Life and PHQ9 Scores as seen by post scores and/or verbalization of changes           Quality of Life Scores:   Quality of Life - 02/23/20 1436      Quality of Life Scores   Health/Function Pre 12.47 %    Health/Function Post 21.86 %    Health/Function % Change 75.3 %    Socioeconomic Pre 20 %    Socioeconomic Post 27.25 %    Socioeconomic % Change  36.25 %    Psych/Spiritual Pre 19.71 %    Psych/Spiritual Post 22.64 %    Psych/Spiritual %  Change 14.87 %    Family Pre 29.5 %    Family Post 27.3 %    Family % Change -7.46 %    GLOBAL Pre 18.07 %    GLOBAL Post 23.89 %    GLOBAL % Change 32.21 %          Scores of 19 and below usually indicate a poorer quality of life in these areas.  A difference of  2-3 points is a clinically meaningful difference.  A difference of 2-3 points in the total score of the Quality of Life Index has been associated with significant improvement in overall quality of life, self-image, physical symptoms, and general health in studies assessing change in quality of life.  PHQ-9: Recent Review Flowsheet Data    Depression screen [facility] 2/9 02/23/2020 11/21/2019   Decreased Interest 0 1   Down, Depressed, Hopeless 0 0   PHQ - 2 Score 0 1   Altered sleeping 1 0   Tired, decreased energy 1 2   Change in appetite 1 0   Feeling bad or failure about yourself  0 0   Trouble concentrating 0 0   Moving slowly or fidgety/restless 1 1   Suicidal  thoughts 0 0   PHQ-9 Score 4 4   Difficult doing work/chores Not difficult at all Somewhat difficult     Interpretation of Total Score  Total Score Depression Severity:  1-4 = Minimal depression, 5-9 = Mild depression, 10-14 = Moderate depression, 15-19 = Moderately severe depression, 20-27 = Severe depression   Psychosocial Evaluation and Intervention:  Psychosocial Evaluation - 02/23/20 1443      Discharge Psychosocial Assessment & Intervention   Comments [name] is nearing graduation.  He has enjoyed attending the program.  He feels that he is now able to do more and move more.  He has plans to continue to exercise and watch his risk factors.           Psychosocial Re-Evaluation:  Psychosocial Re-Evaluation    Row Name 12/01/19 1312 01/04/20 1340 02/06/20 1353         Psychosocial Re-Evaluation   Current issues with Current Stress Concerns Current Stress Concerns --     Comments He has not had a paycheck since October due to being sick and it is stressing him out. [name] is doing well in rehab.  His twitch gets annoying, but otherwise he is feeling good.  His sister just had valve surgery yesterday, after already losing one sister to the same surgery.  So far, things are going well. [facility] is at [facility].  Both he and his other sister have some regurge as well. Harolds sister went home Saturday after being at [facility] for a month.  He reports no other new stress.     Expected Outcomes Short: Continue to exercise regularly to support mental health and notify staff of any changes. Long: maintain mental health and well being through teaching of rehab or prescribed medications independently. Short: Continue to exericse for mental boost Long; Continue to stay positive Short:  continue to exercise Long: maintain positive outlook     Interventions Encouraged to attend Cardiac Rehabilitation for the exercise Encouraged to attend Cardiac Rehabilitation for the exercise --     Continue  Psychosocial Services  Follow up required by staff Follow up required by staff --            Psychosocial Discharge (Final Psychosocial Re-Evaluation):  Psychosocial Re-Evaluation - 02/06/20 1353  Psychosocial Re-Evaluation   Comments Harolds sister went home Saturday after being at [facility] for a month.  He reports no other new stress.    Expected Outcomes Short:  continue to exercise Long: maintain positive outlook           Vocational Rehabilitation: Provide vocational rehab assistance to qualifying candidates.   Vocational Rehab Evaluation & Intervention:   Education: Education Goals: Education classes will be provided on a variety of topics geared toward better understanding of heart health and risk factor modification. Participant will state understanding/return demonstration of topics presented as noted by education test scores.  Learning Barriers/Preferences:  Learning Barriers/Preferences - 11/16/19 1340      Learning Barriers/Preferences   Learning Barriers None    Learning Preferences None           General Cardiac Education Topics:  AED/CPR: - Group verbal and written instruction with the use of models to demonstrate the basic use of the AED with the basic ABC's of resuscitation.   Anatomy & Physiology of the Heart: - Group verbal and written instruction and models provide basic cardiac anatomy and physiology, with the coronary electrical and arterial systems. Review of Valvular disease and Heart Failure   Cardiac Procedures: - Group verbal and written instruction to review commonly prescribed medications for heart disease. Reviews the medication, class of the drug, and side effects. Includes the steps to properly store meds and maintain the prescription regimen. (beta blockers and nitrates)   Cardiac Medications I: - Group verbal and written instruction to review commonly prescribed medications for heart disease. Reviews the medication, class of  the drug, and side effects. Includes the steps to properly store meds and maintain the prescription regimen.   Cardiac Rehab from 02/08/2020 in [facility] Cardiac and Pulmonary Rehab  Date 01/04/20  Educator SB  Instruction Review Code 1- Verbalizes Understanding      Cardiac Medications II: -Group verbal and written instruction to review commonly prescribed medications for heart disease. Reviews the medication, class of the drug, and side effects. (all other drug classes)    Go Sex-Intimacy & Heart Disease, Get SMART - Goal Setting: - Group verbal and written instruction through game format to discuss heart disease and the return to sexual intimacy. Provides group verbal and written material to discuss and apply goal setting through the application of the S.M.A.R.T. Method.   Other Matters of the Heart: - Provides group verbal, written materials and models to describe Stable Angina and Peripheral Artery. Includes description of the disease process and treatment options available to the cardiac patient.   Infection Prevention: - Provides verbal and written material to individual with discussion of infection control including proper hand washing and proper equipment cleaning during exercise session.   Cardiac Rehab from 02/08/2020 in [facility] Cardiac and Pulmonary Rehab  Date 11/16/19  Educator [facility]  Instruction Review Code 1- Verbalizes Understanding      Falls Prevention: - Provides verbal and written material to individual with discussion of falls prevention and safety.   Cardiac Rehab from 02/08/2020 in [facility] Cardiac and Pulmonary Rehab  Date 11/16/19  Educator [facility]  Instruction Review Code 1- Verbalizes Understanding      Other: -Provides group and verbal instruction on various topics (see comments)   Knowledge Questionnaire Score:  Knowledge Questionnaire Score - 02/23/20 1436      Knowledge Questionnaire Score   Post Score 25/26           Core Components/Risk Factors/Patient  Goals at Admission:  Personal Goals and Risk Factors at Admission - 11/21/19 0922      Core Components/Risk Factors/Patient Goals on Admission    Weight Management Yes;Weight Loss    Intervention Weight Management: Develop a combined nutrition and exercise program designed to reach desired caloric intake, while maintaining appropriate intake of nutrient and fiber, sodium and fats, and appropriate energy expenditure required for the weight goal.;Weight Management: Provide education and appropriate resources to help participant work on and attain dietary goals.    Admit Weight 204 lb 6.4 oz (92.7 kg)    Goal Weight: Short Term 199 lb (90.3 kg)    Goal Weight: Long Term 194 lb (88 kg)    Expected Outcomes Short Term: Continue to assess and modify interventions until short term weight is achieved;Long Term: Adherence to nutrition and physical activity/exercise program aimed toward attainment of established weight goal;Weight Loss: Understanding of general recommendations for a balanced deficit meal plan, which promotes 1-2 lb weight loss per week and includes a negative energy balance of [phone] kcal/d;Understanding recommendations for meals to include 15-35% energy as protein, 25-35% energy from fat, 35-60% energy from carbohydrates, less than 265m of dietary cholesterol, 20-35 gm of total fiber daily;Understanding of distribution of calorie intake throughout the day with the consumption of 4-5 meals/snacks    Improve shortness of breath with ADL's Yes    Intervention Provide education, individualized exercise plan and daily activity instruction to help decrease symptoms of SOB with activities of daily living.    Expected Outcomes Short Term: Improve cardiorespiratory fitness to achieve a reduction of symptoms when performing ADLs;Long Term: Be able to perform more ADLs without symptoms or delay the onset of symptoms    Diabetes Yes    Intervention Provide education about signs/symptoms and action to  take for hypo/hyperglycemia.;Provide education about proper nutrition, including hydration, and aerobic/resistive exercise prescription along with prescribed medications to achieve blood glucose in normal ranges: Fasting glucose 65-99 mg/dL    Expected Outcomes Short Term: Participant verbalizes understanding of the signs/symptoms and immediate care of hyper/hypoglycemia, proper foot care and importance of medication, aerobic/resistive exercise and nutrition plan for blood glucose control.;Long Term: Attainment of HbA1C < 7%.    Lipids Yes    Intervention Provide education and support for participant on nutrition & aerobic/resistive exercise along with prescribed medications to achieve LDL <741m HDL >4082m   Expected Outcomes Short Term: Participant states understanding of desired cholesterol values and is compliant with medications prescribed. Participant is following exercise prescription and nutrition guidelines.;Long Term: Cholesterol controlled with medications as prescribed, with individualized exercise RX and with personalized nutrition plan. Value goals: LDL < 15m71mDL > 40 mg.           Education:Diabetes - Individual verbal and written instruction to review signs/symptoms of diabetes, desired ranges of glucose level fasting, after meals and with exercise. Acknowledge that pre and post exercise glucose checks will be done for 3 sessions at entry of program.   [facility]  Date 11/16/19  [facility] Code 1- Verbalizes Understanding      Education: Know Your Numbers and Risk Factors: -Group verbal and written instruction about important numbers in your health.  Discussion of what are risk factors and how they play a role in the disease process.  Review of Cholesterol, Blood Pressure, Diabetes, and BMI and the role they play in your overall health.   Core Components/Risk Factors/Patient Goals Review:   Goals  and  Risk Factor Review    Row Name 12/01/19 1314 01/04/20 1343 02/06/20 1350 02/23/20 1445       Core Components/Risk Factors/Patient Goals Review   Personal Goals Review Weight Management/Obesity;Lipids;Diabetes;Improve shortness of breath with ADL's Weight Management/Obesity;Lipids;Diabetes;Improve shortness of breath with ADL's Weight Management/Obesity;Lipids;Diabetes;Improve shortness of breath with ADL's Weight Management/Obesity;Lipids;Diabetes;Improve shortness of breath with ADL's    Review Spoke to patient about their shortness of breath and what they can do to improve. Patient has been informed of breathing techniques when starting the program. Patient is informed to tell staff if they have had any med changes and that certain meds they are taking or not taking can be causing shortness of breath. [name] is doing well in rehab.  His weight is down some and he would like to continue to lose more.  He is doing well with his pressures still.  Sugars have been good overall. His breathing is also getting better, but still has some episodes of SOB. [name] continues to work on weight loss.  He has been cutting back on bread and pasta.  BG has been running much better.  He checks it at least twice per day.  Laast A1C was 7.1 % "the best its ever been". [name] id going to to continue to work on weight loss.  He continues to work on his diet.  Suguars and pressures have been good.  He is breathing better overall.    Expected Outcomes Short: Attend [facility] regularly to improve shortness of breath with ADL's. Long: maintain independence with ADL's Short: Continue to work on weight loss Long; Continue to improve SOB. Short: continue to monitor portion sizes  Long: reach goal weight Continue to monitor risk factors.           Core Components/Risk Factors/Patient Goals at Discharge (Final Review):   Goals and Risk Factor Review - 02/23/20 1445      Core Components/Risk Factors/Patient Goals Review    Personal Goals Review Weight Management/Obesity;Lipids;Diabetes;Improve shortness of breath with ADL's    Review [name] id going to to continue to work on weight loss.  He continues to work on his diet.  Suguars and pressures have been good.  He is breathing better overall.    Expected Outcomes Continue to monitor risk factors.           ITP Comments:  ITP Comments    Row Name 11/16/19 1352 11/21/19 0915 11/23/19 0608 11/23/19 1308 12/21/19 0759   ITP Comments Virtual Visit completed. Patient informed on EP and RD appointment and 6 Minute walk test. Patient also informed of patient health questionnaires on My Chart. Patient Verbalizes understanding. Visit diagnosis can be found in [facility] 11/14/2019. Completed 6MWT and gym orientation.  Initial ITP created and sent for review to Dr. Emily Filbert, Medical Director. 30 Day review completed. Medical Director ITP review done, changes made as directed, and signed approval by Medical Director. First full day of exercise!  Patient was oriented to gym and equipment including functions, settings, policies, and procedures.  Patient's individual exercise prescription and treatment plan were reviewed.  All starting workloads were established based on the results of the 6 minute walk test done at initial orientation visit.  The plan for exercise progression was also introduced and progression will be customized based on patient's performance and goals. 30 Day review completed. Medical Director ITP review done, changes made as directed, and signed approval by Medical Director.   [place] Name 01/17/20 1412 01/18/20 5329 02/15/20 1609 02/29/20  1332     ITP Comments Patient called, reports that he injured his back at home. He saw a provider today who informed him to take muscle relaxer's and rest the remaining of the week. Patient is not able to come to [facility] this week and plans to call next Monday to let us know how he is feeling and if able to attend rehab. 30 Day review  completed. Medical Director ITP review done, changes made as directed, and signed approval by Medical Director. 30 day review completed. ITP sent to Dr. Emily Filbert, Medical Director of Cardiac and Pulmonary Rehab. Continue with ITP unless changes are made by physician. Jahshua graduated today from  rehab with 35 sessions completed.  Details of the patient's exercise prescription and what He needs to do in order to continue the prescription and progress were discussed with patient.  Patient was given a copy of prescription and goals.  Patient verbalized understanding.  Kasson plans to continue to exercise by walking and using his treadmill.           Comments: Discharge ITP

## 2020-02-29 NOTE — Progress Notes (Signed)
Daily Session Note  Patient Details  Name: Travis Fletcher. MRN: [id number] Date of Birth: 12-02-1955 Referring Provider:     Cardiac Rehab from 11/21/2019 in [facility] Cardiac and Pulmonary Rehab  Referring Provider Adrian Prows MD      Encounter Date: 02/29/2020  Check In:  Session Check In - 02/29/20 1330      Check-In   Supervising physician immediately available to respond to emergencies See telemetry face sheet for immediately available ER MD    Location [facility]-Cardiac & Pulmonary Rehab    Staff Present Justin Mend Sharren Bridge, MS Exercise Physiologist;Skila Rollins Sherryll Burger, RN BSN    Virtual Visit No    Medication changes reported     No    Fall or balance concerns reported    No    Warm-up and Cool-down Performed on first and last piece of equipment    Resistance Training Performed Yes    VAD Patient? No    PAD/SET Patient? No      Pain Assessment   Currently in Pain? No/denies              Social History   Tobacco Use  Smoking Status Never Smoker  Smokeless Tobacco Never Used    Goals Met:  Independence with exercise equipment Exercise tolerated well No report of cardiac concerns or symptoms Strength training completed today  Goals Unmet:  Not Applicable  Comments:  Duayne graduated today from  rehab with 35 sessions completed.  Details of the patient's exercise prescription and what He needs to do in order to continue the prescription and progress were discussed with patient.  Patient was given a copy of prescription and goals.  Patient verbalized understanding.  Skylor plans to continue to exercise by walking and using his treadmill.    Dr. Emily Filbert is Medical Director for [facility].

## 2020-02-29 NOTE — Progress Notes (Signed)
Discharge Progress Report  Patient Details  Name: Travis Fletcher. MRN: [id number] Date of Birth: 03-14-1956 Referring Provider:     Cardiac Rehab from 11/21/2019 in [facility]  Referring Provider Adrian Prows MD       Number of Visits: 35  Reason for Discharge:  Patient reached a stable level of exercise. Patient independent in their exercise. Patient has met program and personal goals.  Smoking History:  Social History   Tobacco Use  Smoking Status Never Smoker  Smokeless Tobacco Never Used    Diagnosis:  Coronary artery disease of native artery of native heart with stable angina pectoris ([place])  ADL UCSD:   Initial Exercise Prescription:  Initial Exercise Prescription - 11/21/19 0900      Date of Initial Exercise RX and Referring Provider   Date 11/21/19    Referring Provider Adrian Prows MD      Treadmill   MPH 2.2    Grade 1.5    Minutes 15    METs 3.14      NuStep   Level 3    SPM 80    Minutes 15    METs 3      REL-XR   Level 3    Speed 50    Minutes 15    METs 3      Biostep-RELP   Level 3    SPM 50    Minutes 15    METs 3      Prescription Details   Frequency (times per week) 3    Duration Progress to 30 minutes of continuous aerobic without signs/symptoms of physical distress      Intensity   THRR 40-80% of Max Heartrate 112-143    Ratings of Perceived Exertion 11-13    Perceived Dyspnea 0-4      Progression   Progression Continue to progress workloads to maintain intensity without signs/symptoms of physical distress.      Resistance Training   Training Prescription Yes    Weight 4 lb    Reps 10-15           Discharge Exercise Prescription (Final Exercise Prescription Changes):  Exercise Prescription Changes - 02/22/20 1400      Response to Exercise   Blood Pressure (Admit) 116/56    Blood Pressure (Exercise) 132/54    Blood Pressure (Exit) 120/64    Heart Rate (Admit) 90 bpm    Heart Rate  (Exercise) 118 bpm    Heart Rate (Exit) 105 bpm    Rating of Perceived Exertion (Exercise) 11    Symptoms none    Duration Continue with 30 min of aerobic exercise without signs/symptoms of physical distress.    Intensity THRR unchanged      Progression   Progression Continue to progress workloads to maintain intensity without signs/symptoms of physical distress.    Average METs 3.31      Resistance Training   Training Prescription Yes    Weight 8 lb    Reps 10-15      Interval Training   Interval Training No      Treadmill   MPH 2.8    Grade 2    Minutes 15    METs 3.93      NuStep   Level 3    Minutes 15    METs 3      REL-XR   Level 9    Minutes 15      Biostep-RELP   Level 4  Minutes 15    METs 3      Home Exercise Plan   Plans to continue exercise at Home (comment)   walking, treadmill   Frequency Add 2 additional days to program exercise sessions.    Initial Home Exercises Provided 01/04/20           Functional Capacity:  6 Minute Walk    Row Name 11/21/19 0916 02/20/20 1613       6 Minute Walk   Phase Initial Discharge    Distance 1210 feet 1510 feet    Distance % Change -- 24.7 %    Distance Feet Change -- 300 ft    Walk Time 6 minutes 6 minutes    # of Rest Breaks 0 0    MPH 2.29 2.85    METS 3.23 3.91    RPE 7 7    Perceived Dyspnea  -- 0    VO2 Peak 11.28 13.6    Symptoms Yes (comment) No    Comments angina 2/10 --    Resting HR 80 bpm 90 bpm    Resting BP 122/70 116/56    Resting Oxygen Saturation  97 % --    Exercise Oxygen Saturation  during 6 min walk 96 % 96 %    Max Ex. HR 105 bpm 117 bpm    Max Ex. BP 128/64 132/54    2 Minute Post BP 122/74 --           Psychological, QOL, Others - Outcomes: PHQ 2/9: Depression screen [facility] 2/9 02/23/2020 11/21/2019  Decreased Interest 0 1  Down, Depressed, Hopeless 0 0  PHQ - 2 Score 0 1  Altered sleeping 1 0  Tired, decreased energy 1 2  Change in appetite 1 0  Feeling bad or  failure about yourself  0 0  Trouble concentrating 0 0  Moving slowly or fidgety/restless 1 1  Suicidal thoughts 0 0  PHQ-9 Score 4 4  Difficult doing work/chores Not difficult at all Somewhat difficult    Quality of Life:  Quality of Life - 02/23/20 1436      Quality of Life Scores   Health/Function Pre 12.47 %    Health/Function Post 21.86 %    Health/Function % Change 75.3 %    Socioeconomic Pre 20 %    Socioeconomic Post 27.25 %    Socioeconomic % Change  36.25 %    Psych/Spiritual Pre 19.71 %    Psych/Spiritual Post 22.64 %    Psych/Spiritual % Change 14.87 %    Family Pre 29.5 %    Family Post 27.3 %    Family % Change -7.46 %    GLOBAL Pre 18.07 %    GLOBAL Post 23.89 %    GLOBAL % Change 32.21 %           Nutrition & Weight - Outcomes:  Pre Biometrics - 11/21/19 0920      Pre Biometrics   Height 5' 10.5" (1.791 m)    Weight 204 lb 6.4 oz (92.7 kg)    BMI (Calculated) 28.9    Single Leg Stand 30 seconds            Nutrition:  Nutrition Therapy & Goals - 12/26/19 1506      Personal Nutrition Goals   Comments Declined nutrition intervention, will continue to check in with pt.           Nutrition Discharge:  Nutrition Assessments - 02/23/20 1436  MEDFICTS Scores   Post Score 23           Education Questionnaire Score:  Knowledge Questionnaire Score - 02/23/20 1436      Knowledge Questionnaire Score   Post Score 25/26           Goals reviewed with patient; copy given to patient.

## 2020-03-22 ENCOUNTER — Other Ambulatory Visit: Payer: Self-pay

## 2020-03-22 ENCOUNTER — Ambulatory Visit (INDEPENDENT_AMBULATORY_CARE_PROVIDER_SITE_OTHER): Payer: 59 | Admitting: Family Medicine

## 2020-03-22 ENCOUNTER — Encounter: Payer: Self-pay | Admitting: Family Medicine

## 2020-03-22 VITALS — BP 118/60 | HR 76 | Temp 97.4°F | Ht 69.25 in | Wt 201.8 lb

## 2020-03-22 DIAGNOSIS — Z23 Encounter for immunization: Secondary | ICD-10-CM

## 2020-03-22 DIAGNOSIS — Z789 Other specified health status: Secondary | ICD-10-CM

## 2020-03-22 DIAGNOSIS — I25118 Atherosclerotic heart disease of native coronary artery with other forms of angina pectoris: Secondary | ICD-10-CM | POA: Diagnosis not present

## 2020-03-22 DIAGNOSIS — Z8546 Personal history of malignant neoplasm of prostate: Secondary | ICD-10-CM

## 2020-03-22 DIAGNOSIS — E108 Type 1 diabetes mellitus with unspecified complications: Secondary | ICD-10-CM

## 2020-03-22 DIAGNOSIS — I10 Essential (primary) hypertension: Secondary | ICD-10-CM

## 2020-03-22 DIAGNOSIS — E103293 Type 1 diabetes mellitus with mild nonproliferative diabetic retinopathy without macular edema, bilateral: Secondary | ICD-10-CM

## 2020-03-22 NOTE — Assessment & Plan Note (Signed)
Follows with Dr. Einar Gip and doing well in cardiac rehab. Cont metoprolol 25 mg

## 2020-03-22 NOTE — Patient Instructions (Signed)
#  Referral I have placed a referral to a specialist for you. You should receive a phone call from the specialty office. Make sure your voicemail is not full and that if you are able to answer your phone to unknown or new numbers.   It may take up to 2 weeks to hear about the referral. If you do not hear anything in 2 weeks, please call our office and ask to speak with the referral coordinator.   

## 2020-03-22 NOTE — Assessment & Plan Note (Addendum)
Follows with Dr. Buddy Duty. Reports last Hgb A1c 7.1 - will obtain records. Follows diet. On ARB. Cannot tolerate statins and is on Praluent 150 mg. Appreciate endocrine and cardiology support. Cont insulin

## 2020-03-22 NOTE — Assessment & Plan Note (Signed)
Continue following with Dr. Meriam Sprague. Complete remission

## 2020-03-22 NOTE — Assessment & Plan Note (Addendum)
Follows with Dr. Einar Gip and cont Praluent 150 mg every 14 days

## 2020-03-22 NOTE — Progress Notes (Signed)
Subjective:     Travis Fletcher. is a 64 y.o. male presenting for Establish Care     HPI  Reports 7.1 Hemoglobin A1c with endocrinology - outside records  Sees endocrinology   Had covid associated myocarditis  Had a Cath last spring - follows with Dr. Einar Gip  Doing well in cardiac rehab   Review of Systems   Social History   Tobacco Use  Smoking Status Never Smoker  Smokeless Tobacco Never Used        Objective:    BP Readings from Last 3 Encounters:  03/22/20 118/60  01/17/20 122/71  11/14/19 131/76   Wt Readings from Last 3 Encounters:  03/22/20 201 lb 12 oz (91.5 kg)  11/21/19 204 lb 6.4 oz (92.7 kg)  11/14/19 204 lb (92.5 kg)    BP 118/60   Pulse 76   Temp (!) 97.4 F (36.3 C) (Temporal)   Ht 5' 9.25" (1.759 m)   Wt 201 lb 12 oz (91.5 kg)   SpO2 97%   BMI 29.58 kg/m    Physical Exam Constitutional:      Appearance: Normal appearance. He is not ill-appearing or diaphoretic.  HENT:     Right Ear: External ear normal.     Left Ear: External ear normal.     Nose: Nose normal.  Eyes:     General: No scleral icterus.    Extraocular Movements: Extraocular movements intact.     Conjunctiva/sclera: Conjunctivae normal.  Cardiovascular:     Rate and Rhythm: Normal rate and regular rhythm.     Heart sounds: No murmur heard.   Pulmonary:     Effort: Pulmonary effort is normal. No respiratory distress.     Breath sounds: Normal breath sounds. No wheezing.  Musculoskeletal:     Cervical back: Neck supple.  Skin:    General: Skin is warm and dry.  Neurological:     Mental Status: He is alert. Mental status is at baseline.  Psychiatric:        Mood and Affect: Mood normal.           Assessment & Plan:   Problem List Items Addressed This Visit      Cardiovascular and Mediastinum   CAD (coronary artery disease), native coronary artery    Follows with Dr. Einar Gip and doing well in cardiac rehab. Cont metoprolol 25 mg       Relevant Medications   PRALUENT 150 MG/ML SOAJ   Essential hypertension    BP well controled. Cont amlodipine 5 mg and losartan 12.5 mg      Relevant Medications   PRALUENT 150 MG/ML SOAJ     Endocrine   Mild non proliferative diabetic retinopathy ([place]) - Primary   Relevant Orders   Ambulatory referral to Ophthalmology   Type 1 diabetes mellitus with complications ([place])    Follows with Dr. Buddy Duty. Reports last Hgb A1c 7.1 - will obtain records. Follows diet. On ARB. Cannot tolerate statins and is on Praluent 150 mg. Appreciate endocrine and cardiology support. Cont insulin        Other   Statin intolerance    Follows with Dr. Einar Gip and cont Praluent 150 mg every 14 days       History of prostate cancer    Continue following with Dr. Meriam Sprague. Complete remission       Other Visit Diagnoses    Need for influenza vaccination       Relevant Orders   Flu Vaccine QUAD  36+ mos IM (Completed)       Return if symptoms worsen or fail to improve.  Lesleigh Noe, MD  This visit occurred during the SARS-CoV-2 public health emergency.  Safety protocols were in place, including screening questions prior to the visit, additional usage of staff PPE, and extensive cleaning of exam room while observing appropriate contact time as indicated for disinfecting solutions.

## 2020-03-22 NOTE — Assessment & Plan Note (Signed)
BP well controled. Cont amlodipine 5 mg and losartan 12.5 mg

## 2020-03-23 ENCOUNTER — Encounter: Payer: Self-pay | Admitting: Family Medicine

## 2020-03-26 ENCOUNTER — Other Ambulatory Visit: Payer: Self-pay | Admitting: Cardiology

## 2020-05-11 ENCOUNTER — Other Ambulatory Visit: Payer: Self-pay | Admitting: Cardiology

## 2020-05-14 LAB — HM DIABETES EYE EXAM

## 2020-05-16 ENCOUNTER — Ambulatory Visit: Payer: 59 | Admitting: Cardiology

## 2020-05-24 ENCOUNTER — Other Ambulatory Visit: Payer: Self-pay

## 2020-05-24 ENCOUNTER — Encounter: Payer: Self-pay | Admitting: Cardiology

## 2020-05-24 ENCOUNTER — Ambulatory Visit: Payer: 59 | Admitting: Cardiology

## 2020-05-24 VITALS — BP 119/63 | HR 87 | Resp 16 | Ht 69.0 in | Wt 205.0 lb

## 2020-05-24 DIAGNOSIS — I208 Other forms of angina pectoris: Secondary | ICD-10-CM

## 2020-05-24 DIAGNOSIS — E7800 Pure hypercholesterolemia, unspecified: Secondary | ICD-10-CM

## 2020-05-24 DIAGNOSIS — I25118 Atherosclerotic heart disease of native coronary artery with other forms of angina pectoris: Secondary | ICD-10-CM

## 2020-05-24 DIAGNOSIS — E1065 Type 1 diabetes mellitus with hyperglycemia: Secondary | ICD-10-CM

## 2020-05-24 NOTE — Progress Notes (Signed)
 Primary Physician/Referring:  Cody, Jessica R, MD  Patient ID: Travis Harold Lemay Jr., male    DOB: 03/27/1956, 64 y.o.   MRN: [id number]  Chief Complaint  Patient presents with  . Coronary Artery Disease  . Hypertension  . Follow-up   HPI:    Travis Harold Mcconnell Jr.  is a 64 y.o. male  with known coronary artery disease, has mid LAD stent placed on 06/15/2015 with 3 x 26 mm resolute stent, balloon angioplasty to ramus intermediate on 07/11/2014 and mid circumflex stent with a 4.0 x 15 mm integrity non-DES stent in 2012. He presented on 06/24/2016 with symptoms of unstable angina pectoris, coronary angiography on 06/26/2017 revealed an ulcerated 95% stenosis of the apical LAD and about a 50-60% stenosis of the distal LAD and previously performed angioplasty sites were widely patent. Medical therapy was recommended. He has a history of hyperlipidemia with statin intolerance now on Repatha, uncontrolled type I diabetes follows Dr Jeff Kerr.  Recently had COVID in January 2021.  He completed cardiac rehab.  States that since cardiac rehab his breathing has improved significantly, he has not had frequent episodes of angina and has in fact discontinued amlodipine.  He is also reduce the dose of Ranexa.  Overall feels well.  Dates that his diabetes is also improving.  Past Medical History:  Diagnosis Date  . Anginal pain (HCC)   . Arthritis    "hands" (07/11/2014)  . CAD (coronary artery disease), native coronary artery 07/24/2018  . Coronary artery disease   . High cholesterol   . Hypercholesteremia 07/24/2018  . Kidney stones    "passed them all"  . Neuromuscular disorder (HCC)    neuropathy  . Prostate cancer (HCC) 2010  . Statin intolerance 07/24/2018  . Type 1 diabetes mellitus with other specified complication (HCC)    Past Surgical History:  Procedure Laterality Date  . BALLOON ANGIOPLASTY, ARTERY  07/13/2012  . CARDIAC CATHETERIZATION N/A 06/15/2015   Procedure: Left  Heart Cath and Coronary Angiography;  Surgeon: Jay Ganji, MD;  Location: [facility] INVASIVE CV [facility];  Service: Cardiovascular;  Laterality: N/A;  . CARDIAC CATHETERIZATION N/A 06/15/2015   Procedure: Coronary Stent Intervention;  Surgeon: Jay Ganji, MD;  Location: [facility] INVASIVE CV [facility];  Service: Cardiovascular;  Laterality: N/A;  . CATARACT EXTRACTION W/ INTRAOCULAR LENS  IMPLANT, BILATERAL Bilateral ~ 2000  . CORONARY ANGIOPLASTY  07/11/2014  . CORONARY ANGIOPLASTY WITH STENT PLACEMENT  2012   "1"  . EYE SURGERY    . INGUINAL HERNIA REPAIR Left 1987  . INSERTION PROSTATE RADIATION SEED  2010  . LEFT HEART CATH AND CORONARY ANGIOGRAPHY N/A 06/26/2017   Procedure: LEFT HEART CATH AND CORONARY ANGIOGRAPHY;  Surgeon: Ganji, Jay, MD;  Location: [facility] INVAS[facility] CV [facility];  Service: Cardiovascular;  Laterality: N/A;  . LEFT HEART CATH AND CORONARY ANGIOGRAPHY N/A 08/30/2019   Procedure: LEFT HEART CATH AND CORONARY ANGIOGRAPHY;  Surgeon: Ganji, Jay, MD;  Location: [facility] CV [facility];  Service: Cardiovascular;  Laterality: N/A;  . LEFT HEART CATHETERIZATION WITH CORONARY ANGIOGRAM N/A 07/13/2012   Procedure: LEFT HEART CATHETERIZATION WITH CORONARY ANGIOGRAM;  Surgeon: Jagadeesh R Ganji, MD;  Location: [facility] CATH LAB;  Service: Cardiovascular;  Laterality: N/A;  . LEFT HEART CATHETERIZATION WITH CORONARY ANGIOGRAM N/A 07/11/2014   Procedure: LEFT HEART CATHETERIZATION WITH CORONARY ANGIOGRAM;  Surgeon: Jagadeesh R Ganji, MD;  Location: [facility] CATH LAB;  Service: Cardiovascular;  Laterality: N/A;  . RETINAL DETACHMENT SURGERY Left 2010  . RETINAL LASER PROCEDURE Right   2010  . THYROIDECTOMY, PARTIAL  ~ 1979  . TONSILLECTOMY AND ADENOIDECTOMY  1977   Family History  Problem Relation Age of Onset  . Stroke Mother 80  . Atrial fibrillation Mother   . Atrial fibrillation Father   . Heart disease Sister   . Pancreatic cancer Paternal Uncle   . Mesothelioma Paternal Uncle     Social History   Tobacco Use  . Smoking status: Never  Smoker  . Smokeless tobacco: Never Used  Substance Use Topics  . Alcohol use: Yes    Comment: 1-2 beers, a few days a week   Marital Status: Married  ROS  Review of Systems  Cardiovascular: Positive for dyspnea on exertion (stable) and palpitations (occasional). Negative for chest pain, leg swelling and syncope.   Objective  Blood pressure 119/63, pulse 87, resp. rate 16, height 5' 9" (1.753 m), weight 205 lb (93 kg), SpO2 96 %.  Vitals with BMI 05/24/2020 03/22/2020 01/17/2020  Height 5' 9" 5' 9.25" -  Weight 205 lbs 201 lbs 12 oz -  BMI 30.26 29.58 -  Systolic 119 118 122  Diastolic 63 60 71  Pulse 87 76 88     Physical Exam Vitals and nursing note reviewed.  Constitutional:      General: He is not in acute distress. Neck:     Vascular: No JVD.  Cardiovascular:     Rate and Rhythm: Normal rate and regular rhythm.     Pulses: Intact distal pulses.     Heart sounds: Normal heart sounds. No murmur heard.   Pulmonary:     Effort: Pulmonary effort is normal.     Breath sounds: Normal breath sounds. No wheezing or rales.    Laboratory examination:   Recent Labs    08/19/19 1002  NA 140  K 4.7  CL 102  CO2 23  GLUCOSE 97  BUN 14  CREATININE 1.04  CALCIUM 9.7  GFRNONAA 77  GFRAA 89   CrCl cannot be calculated (Patient's most recent lab result is older than the maximum 21 days allowed.).  CMP Latest Ref Rng & Units 08/19/2019 06/26/2017 10/29/2015  Glucose 65 - 99 mg/dL 97 203(H) 135(H)  BUN 8 - 27 mg/dL 14 12 14  Creatinine 0.76 - 1.27 mg/dL 1.04 0.91 0.97  Sodium 134 - 144 mmol/L 140 137 138  Potassium 3.5 - 5.2 mmol/L 4.7 4.2 4.8  Chloride 96 - 106 mmol/L 102 104 105  CO2 20 - 29 mmol/L 23 23 26  Calcium 8.6 - 10.2 mg/dL 9.7 9.2 9.6  Total Protein 6.5 - 8.1 g/dL - - 6.7  Total Bilirubin 0.3 - 1.2 mg/dL - - 1.0  Alkaline Phos 38 - 126 U/L - - 78  AST 15 - 41 U/L - - 32  ALT 17 - 63 U/L - - 32   CBC Latest Ref Rng & Units 08/19/2019 06/26/2017 10/29/2015  WBC  3.4 - 10.8 x10E3/uL 5.7 8.5 9.0  Hemoglobin 13.0 - 17.7 g/dL 16.5 15.7 14.9  Hematocrit 37.5 - 51.0 % 48.9 44.6 43.5  Platelets 150 - 450 x10E3/uL 240 236 235   Lipid Panel Recent Labs    11/25/19 0940  CHOL 273*  TRIG 127  LDLCALC 195*  HDL 55     External labs:    TSH 2.180 01/02/2020  A1C 7.100 % 01/02/2020  Cholesterol, total 143.000 01/18/2019 HDL 52.000 01/18/2019 LDL 32.000 01/18/2019 Triglycerides 293.000 01/18/2019  A1C 8.700 07/05/2019  Creatinine, Serum 0.970 01/18/2019 Potassium 4.400 01/18/2019   ALT (SGPT) 24.000 01/18/2019 TSH 1.930 01/18/2019  Hemoglobin 15.100 12/16/2017 INR 0.940 06/26/2017 Platelets 236.000 06/26/2017  12/16/2017: Total cholesterol 109, triglycerides 110, HDL 52, LDL 56.    TSH normal.  A1c 7.7%.  Serum glucose 174, BUN 15, creatinine 0.85, which eGFR greater than 60 ML, CMP otherwise normal.  CBC normal.  Medications and allergies   Allergies  Allergen Reactions  . Brilinta [Ticagrelor] Shortness Of Breath  . Statins     Muscle pain, fatigue     Current Outpatient Medications on File Prior to Visit  Medication Sig Dispense Refill  . acetaminophen (TYLENOL) 325 MG tablet Take 650 mg by mouth every 6 (six) hours as needed for moderate pain or headache.    . albuterol (VENTOLIN HFA) 108 (90 Base) MCG/ACT inhaler Inhale 1 puff into the lungs every 6 (six) hours as needed for wheezing or shortness of breath.     . Alirocumab (PRALUENT) 150 MG/ML SOAJ See admin instructions.    Travis Kitchen aspirin 81 MG tablet Take 1 tablet (81 mg total) by mouth daily.    . cyclobenzaprine (FLEXERIL) 10 MG tablet Take 1 tablet (10 mg total) by mouth 2 (two) times daily as needed for muscle spasms. 20 tablet 0  . guaiFENesin (MUCINEX) 600 MG 12 hr tablet Take 600 mg by mouth 2 (two) times daily as needed (congestion).    Travis Kitchen ibuprofen (ADVIL) 600 MG tablet Take 1 tablet (600 mg total) by mouth every 6 (six) hours as needed. 30 tablet 0  . insulin aspart (NOVOLOG) 100  UNIT/ML injection Inject into the skin See admin instructions. Uses via Insulin Pump    . losartan (COZAAR) 25 MG tablet TAKE HALF A TABLET BY MOUTH DAILY 90 tablet 0  . MELATONIN PO Take 1 tablet by mouth at bedtime as needed (sleep).    . metoprolol tartrate (LOPRESSOR) 25 MG tablet TAKE 1/2 TABLET BY MOUTH TWICE A DAY AS NEEDED 180 tablet 0  . Multiple Vitamins-Minerals (CENTRUM SILVER 50+MEN PO) Take 1 tablet by mouth daily.    . nitroGLYCERIN (NITROSTAT) 0.4 MG SL tablet Place 1 tablet (0.4 mg total) under the tongue every 5 (five) minutes as needed for chest pain. 30 tablet 6  . ranolazine (RANEXA) 500 MG 12 hr tablet TAKE 1 TABLET BY MOUTH TWICE A DAY 180 tablet 1   No current facility-administered medications on file prior to visit.    Radiology:   No results found.  Cardiac Studies:   Lexiscan Sestamibi stress test 09/04/2017:  1. Pharmacologic stress testing was performed with intravenous administration of .4 mg of Lexiscan over a 10-15 seconds infusion. Stress symptoms included dyspnea and abdominal pain. Normal blood pressure. Exercise capacity not assessed. Stress EKG is non diagnostic for ischemia as it is a pharmacologic stress.  2. The overall quality of the study is excellent. There is no evidence of abnormal lung activity. Stress and rest SPECT images demonstrate homogeneous tracer distribution throughout the myocardium. Gated SPECT imaging reveals normal myocardial thickening and wall motion. The left ventricular ejection fraction was normal (81%).   3. Low risk study.  Coronary angiogram 06/26/2017: Normal LV systolic function, no regional wall motion of a mallet he. Widely patent mid LAD stent and mid circumflex stent and balloon angioplasty site to the ramus intermediate. The mid to distal LAD has a focal 60-70% stenosis. Apical LAD has a ulcerated 95-99% stenosis.  Echocardiogram 07/20/2019:  Left ventricle cavity is normal in size. Mild concentric hypertrophy of the  left ventricle.  Normal LV systolic function with EF 65%. Normal global wall motion. Normal diastolic filling pattern. Calculated EF 65%.  Mild (Grade I) mitral regurgitation.  Mild tricuspid regurgitation. Estimated pulmonary artery systolic pressure is 26 mmHg.  No significant change compared to previous study on 08/27/2017.  72-hour Holter monitor 2/8-2/04/2020:  Normal sinus rhythm.  Minimum heart rate 66 bpm, max heart rate 113 bpm.  4.73% tachycardia burden.  Occasional PVC (1.32% burden) in the form of isolated, couplets, and in a bigeminal pattern.  Rare PAC.  No reported symptoms.  No A. fib or SVT was noted.  CPX 08/01/2019: Exercise testing with gas exchange demonstrates low-normal functional capacity when compared to matched sedentary norms. There are no clear indications for cardiopulmonary limitations. At peak exercise there was normal O2 pulse.  Low normal functional capacity. No evidence of HF limitation. At peak exercise, there was lateral ST depression suggestive of ischemia.   Left Heart Catheterization 08/30/19:    The left ventricular systolic function is normal.  LV end diastolic pressure is normal. LV: Normal LV systolic function.  Normal EDP. No change in coronary anatomy since 06/26/2017. Apical LAD ulcerated stenosis has completely healed. Small vessel disease and microvascular angina. Previous PTCA site widely patent: See below.  LAD stent placed on 06/15/2015 with 3 x 26 mm resolute stent. Ramus intermediate balloon angioplasty on 07/11/2014 and mid circumflex stent with a 4.0 x 15 mm integrity non-DES stent in 2012.  Recommendation: Patient's angina pectoris is related to small vessel disease and microvascular angina pectoris in view of uncontrolled diabetes mellitus.  I have reassured him.  50 mL contrast utilized.  EKG    EKG 05/24/2020: Normal sinus rhythm at rate of 84 bpm, normal axis, no evidence of ischemia, normal EKG.   No significant change from  08/11/2019   Assessment     ICD-10-CM   1. Coronary artery disease of native artery of native heart with stable angina pectoris (HCC)  I25.118 EKG 12-Lead  2. Microvascular angina (HCC)  I20.8   3. Hypercholesteremia  E78.00 Lipid Panel With LDL/HDL Ratio  4. Uncontrolled type 1 diabetes mellitus with hyperglycemia (HCC)  E10.65      Recommendations:   Parag Harold Baldini Jr.  is a 64 y.o. male  with known coronary artery disease, has mid LAD stent placed on 06/15/2015 with 3 x 26 mm resolute stent, balloon angioplasty to ramus intermediate on 07/11/2014 and mid circumflex stent with a 4.0 x 15 mm integrity non-DES stent in 2012. He presented on 06/24/2016 with symptoms of unstable angina pectoris, coronary angiography on 06/26/2017 revealed an ulcerated 95% stenosis of the apical LAD and about a 50-60% stenosis of the distal LAD and previously performed angioplasty sites were widely patent. Medical therapy was recommended. He has a history of hyperlipidemia with statin intolerance now on Praluent, uncontrolled type I diabetes follows Dr Jeff Kerr.  He completed cardiac rehab since then he has not had any recurrence of angina pectoris, he in fact has discontinued amlodipine and reduce the dose of Ranexa.  He is also taken a retirement.  Overall he is doing well from cardiac standpoint, blood pressures well controlled, he needs lipid profile testing.  As he remained stable, I will see him back on annual basis.    Jay Ganji, MD, FACC 05/24/2020, 1:44 PM Office: [phone] Pager: [phone]  

## 2020-05-30 LAB — LIPID PANEL WITH LDL/HDL RATIO
Cholesterol, Total: 144 mg/dL (ref 100–199)
HDL: 60 mg/dL (ref >39)
LDL Chol Calc (NIH): 67 mg/dL (ref 0–99)
LDL/HDL Ratio: 1.1 ratio (ref 0.0–3.6)
Triglycerides: 91 mg/dL (ref 0–149)
VLDL Cholesterol Cal: 17 mg/dL (ref 5–40)

## 2020-07-10 ENCOUNTER — Telehealth (INDEPENDENT_AMBULATORY_CARE_PROVIDER_SITE_OTHER): Payer: 59 | Admitting: Family Medicine

## 2020-07-10 ENCOUNTER — Encounter: Payer: Self-pay | Admitting: Family Medicine

## 2020-07-10 ENCOUNTER — Telehealth: Payer: Self-pay | Admitting: Family Medicine

## 2020-07-10 ENCOUNTER — Other Ambulatory Visit: Payer: Self-pay

## 2020-07-10 DIAGNOSIS — M545 Low back pain, unspecified: Secondary | ICD-10-CM | POA: Diagnosis not present

## 2020-07-10 MED ORDER — CYCLOBENZAPRINE HCL 10 MG PO TABS: 5.0000 mg | ORAL_TABLET | Freq: Three times a day (TID) | ORAL | 0 refills | Status: DC | PRN

## 2020-07-10 NOTE — Progress Notes (Signed)
Musculoskeletal Exam  Patient: Travis Fletcher. DOB: 12/15/1955  DOS: 07/10/2020  SUBJECTIVE:  Chief Complaint:   Chief Complaint  Patient presents with  . Back Pain    Travis Fletcher. is a 65 y.o.  male for evaluation and treatment of back pain. Due to COVID-19 pandemic, we are interacting via telephone. I verified patient's ID using 2 identifiers. Patient agreed to proceed with visit via this method. Patient is at home, I am at office. Patient and I are present for visit.   Onset:  2 days ago. No inj or change in activity.  Location: lower left Character:  throbbing  Progression of issue:  is unchanged Associated symptoms: no bruising, redness, swelling Denies bowel/bladder incontinence or weakness Treatment: to date has been OTC NSAIDS.   Neurovascular symptoms: no  Past Medical History:  Diagnosis Date  . Anginal pain ([place])   . Arthritis    "hands" (07/11/2014)  . CAD (coronary artery disease), native coronary artery 07/24/2018  . Coronary artery disease   . High cholesterol   . Hypercholesteremia 07/24/2018  . Kidney stones    "passed them all"  . Neuromuscular disorder (HCC)    neuropathy  . Prostate cancer ([place]) 2010  . Statin intolerance 07/24/2018  . Type 1 diabetes mellitus with other specified complication (HCC)    Objective: No conversational dyspnea Age appropriate judgment and insight Nml affect and mood  Assessment:  Acute left-sided low back pain without sciatica - Plan: cyclobenzaprine (FLEXERIL) 10 MG tablet  Plan: Stretches/exercises, heat, ice, Tylenol, NSAIDs. Flexeril, warnings about drowsiness verbalized and written down. He has done well with this in the past.  F/u prn. Total time: 11 min The patient voiced understanding and agreement to the plan.   New Travis Fletcher 07/10/20  2:20 PM

## 2020-07-10 NOTE — Telephone Encounter (Signed)
Pt called in he hurt his back again and wanted to know if he can be prescribed flexril

## 2020-07-10 NOTE — Telephone Encounter (Signed)
Thank you :)

## 2020-07-10 NOTE — Telephone Encounter (Signed)
Scheduled him for virtual today with Dr. Nani Ravens

## 2020-07-10 NOTE — Telephone Encounter (Signed)
Pt will need an appt for this. Please call pt and schedule him with earliest availability with any provider if Dr. Einar Pheasant doesn't have anything soon. Thank you.

## 2020-07-13 ENCOUNTER — Encounter: Payer: Self-pay | Admitting: Ophthalmology

## 2020-09-17 LAB — HM DIABETES EYE EXAM

## 2020-09-19 ENCOUNTER — Other Ambulatory Visit: Payer: Self-pay | Admitting: Cardiology

## 2020-11-07 ENCOUNTER — Other Ambulatory Visit: Payer: Self-pay | Admitting: Cardiology

## 2020-12-11 ENCOUNTER — Other Ambulatory Visit: Payer: Self-pay | Admitting: Cardiology

## 2020-12-19 ENCOUNTER — Encounter: Payer: Self-pay | Admitting: Family Medicine

## 2021-02-04 ENCOUNTER — Other Ambulatory Visit: Payer: Self-pay

## 2021-02-04 ENCOUNTER — Encounter: Payer: Self-pay | Admitting: Family Medicine

## 2021-02-04 ENCOUNTER — Ambulatory Visit (INDEPENDENT_AMBULATORY_CARE_PROVIDER_SITE_OTHER): Payer: 59 | Admitting: Family Medicine

## 2021-02-04 VITALS — BP 110/50 | HR 86 | Temp 98.1°F | Wt 205.5 lb

## 2021-02-04 DIAGNOSIS — L03113 Cellulitis of right upper limb: Secondary | ICD-10-CM | POA: Diagnosis not present

## 2021-02-04 MED ORDER — CEPHALEXIN 500 MG PO CAPS: 500.0000 mg | ORAL_CAPSULE | Freq: Two times a day (BID) | ORAL | 0 refills | Status: AC

## 2021-02-04 NOTE — Progress Notes (Signed)
   Subjective:     Randle Zebrowski. is a 65 y.o. male presenting for Mass (X "a few weeks" On R brachial area, but grew on Saturday. No drainage)     HPI  #Mass - started a few weeks ago - was less than pea sized under the skin - was deeper but on Saturday increased in size - occasional pain - no swelling in the arms - redness started on Saturday - no tingling or numbness - not itchy - occasional pain  - using warm compresses - w/o improvement, 2-3 times a day - has been taking ibuprofen for shoulder - 1-2 times a day   Review of Systems   Social History   Tobacco Use  Smoking Status Never  Smokeless Tobacco Never        Objective:    BP Readings from Last 3 Encounters:  02/04/21 (!) 110/50  05/24/20 119/63  03/22/20 118/60   Wt Readings from Last 3 Encounters:  02/04/21 205 lb 8 oz (93.2 kg)  05/24/20 205 lb (93 kg)  03/22/20 201 lb 12 oz (91.5 kg)    BP (!) 110/50   Pulse 86   Temp 98.1 F (36.7 C) (Temporal)   Wt 205 lb 8 oz (93.2 kg)   SpO2 96%   BMI 30.35 kg/m    Physical Exam Constitutional:      Appearance: Normal appearance. He is not ill-appearing or diaphoretic.  HENT:     Right Ear: External ear normal.     Left Ear: External ear normal.  Eyes:     General: No scleral icterus.    Extraocular Movements: Extraocular movements intact.     Conjunctiva/sclera: Conjunctivae normal.  Cardiovascular:     Rate and Rhythm: Normal rate.  Pulmonary:     Effort: Pulmonary effort is normal.  Musculoskeletal:     Cervical back: Neck supple.  Skin:    General: Skin is warm and dry.     Comments: Right anticubital fossa with 1 cm firm nodule which is erythematous and ttp. No fluctuance.   Neurological:     Mental Status: He is alert. Mental status is at baseline.  Psychiatric:        Mood and Affect: Mood normal.          Assessment & Plan:   Problem List Items Addressed This Visit   None Visit Diagnoses      Cellulitis of right arm    -  Primary   Relevant Medications   cephALEXin (KEFLEX) 500 MG capsule      Cellulitis vs infected cyst. Treat with abx as above. Return and worsening precautions discussed.   If nodule/lesion remains after treatment - derm referral   Return if symptoms worsen or fail to improve.  Lesleigh Noe, MD  This visit occurred during the SARS-CoV-2 public health emergency.  Safety protocols were in place, including screening questions prior to the visit, additional usage of staff PPE, and extensive cleaning of exam room while observing appropriate contact time as indicated for disinfecting solutions.

## 2021-02-04 NOTE — Patient Instructions (Signed)
Skin infection - take antibiotic - if worsening redness, swelling, discharge or fever/chills call back - if no improvement within 2 days return

## 2021-02-08 ENCOUNTER — Other Ambulatory Visit: Payer: Self-pay

## 2021-02-08 MED ORDER — PRALUENT 150 MG/ML ~~LOC~~ SOAJ: SUBCUTANEOUS | 1 refills | Status: DC

## 2021-02-28 ENCOUNTER — Telehealth: Payer: Self-pay | Admitting: Family Medicine

## 2021-02-28 NOTE — Telephone Encounter (Signed)
Pt called stating that the medication cephALEXin (KEFLEX) 500 MG capsule works for him, but after the medication ran out the lump cam back. Pt asking for another refill

## 2021-03-01 NOTE — Telephone Encounter (Signed)
LOV - 02/04/21 with Dr. Tristan Schroeder - not scheduled Given on 02/04/21 #14/0

## 2021-03-01 NOTE — Telephone Encounter (Signed)
Pt called in requesting to know status of refills .Would  a call back  [phone]

## 2021-03-02 NOTE — Telephone Encounter (Signed)
He will need an office visit. If it is just a lump without redness then it may be a cyst and I can refer to dermatologist for removal. Only needs antibiotics if redness or pain present

## 2021-03-05 NOTE — Telephone Encounter (Signed)
Called pt to relay Dr. Verda Cumins message. Pt stated that he called last Thursday and Friday and if we are too busy to call him back before now, then we are too busy to be his dr and then he hung up on me.

## 2021-03-27 ENCOUNTER — Other Ambulatory Visit (HOSPITAL_BASED_OUTPATIENT_CLINIC_OR_DEPARTMENT_OTHER): Payer: Self-pay | Admitting: Physical Medicine and Rehabilitation

## 2021-03-27 DIAGNOSIS — M542 Cervicalgia: Secondary | ICD-10-CM

## 2021-05-24 ENCOUNTER — Encounter: Payer: Self-pay | Admitting: Cardiology

## 2021-05-24 ENCOUNTER — Other Ambulatory Visit: Payer: Self-pay

## 2021-05-24 ENCOUNTER — Ambulatory Visit: Payer: 59 | Admitting: Cardiology

## 2021-05-24 VITALS — BP 137/73 | HR 83 | Temp 97.8°F | Resp 16 | Ht 69.0 in | Wt 204.2 lb

## 2021-05-24 DIAGNOSIS — E7800 Pure hypercholesterolemia, unspecified: Secondary | ICD-10-CM

## 2021-05-24 DIAGNOSIS — I25118 Atherosclerotic heart disease of native coronary artery with other forms of angina pectoris: Secondary | ICD-10-CM

## 2021-05-24 NOTE — Progress Notes (Signed)
Primary Physician/Referring:  Kelton Pillar, MD  Patient ID: Travis Kung., male    DOB: 04-Nov-1955, 65 y.o.   MRN: [id number]  Chief Complaint  Patient presents with   Coronary Artery Disease   Hyperlipidemia    1 year   HPI:    Travis Hoen.  is a 65 y.o. male  with coronary artery disease, type 1 diabetes mellitus which is fairly controlled now, hyperlipidemia, inability to tolerate statins due to statin myopathy, presently on Praluent presents here for annual visit.  He remains asymptomatic.  There is cervical spine injury that occurred in October 2022 and he is presently in rehab for this.  But has recuperated well.  No neurologic deficits.  Past Medical History:  Diagnosis Date   Anginal pain ([place])    Arthritis    "hands" (07/11/2014)   CAD (coronary artery disease), native coronary artery 07/24/2018   Coronary artery disease    High cholesterol    Hypercholesteremia 07/24/2018   Kidney stones    "passed them all"   Neuromuscular disorder ([place])    neuropathy   Prostate cancer ([place]) 2010   Statin intolerance 07/24/2018   Type 1 diabetes mellitus with other specified complication [facility])    Past Surgical History:  Procedure Laterality Date   BALLOON ANGIOPLASTY, ARTERY  07/13/2012   CARDIAC CATHETERIZATION N/A 06/15/2015   Procedure: Left Heart Cath and Coronary Angiography;  Surgeon: Adrian Prows, MD;  Location: [place] [facility];  Service: Cardiovascular;  Laterality: N/A;   CARDIAC CATHETERIZATION N/A 06/15/2015   Procedure: Coronary Stent Intervention;  Surgeon: Adrian Prows, MD;  Location: [place] [facility];  Service: Cardiovascular;  Laterality: N/A;   CATARACT EXTRACTION W/ INTRAOCULAR LENS  IMPLANT, BILATERAL Bilateral ~ 2000   CORONARY ANGIOPLASTY  07/11/2014   CORONARY ANGIOPLASTY WITH STENT PLACEMENT  2012   "1"   EYE SURGERY     INGUINAL HERNIA REPAIR Left 1987   INSERTION PROSTATE RADIATION SEED  2010   LEFT HEART CATH AND CORONARY  ANGIOGRAPHY N/A 06/26/2017   Procedure: LEFT HEART CATH AND CORONARY ANGIOGRAPHY;  Surgeon: Adrian Prows, MD;  Location: [place] [facility];  Service: Cardiovascular;  Laterality: N/A;   LEFT HEART CATH AND CORONARY ANGIOGRAPHY N/A 08/30/2019   Procedure: LEFT HEART CATH AND CORONARY ANGIOGRAPHY;  Surgeon: Adrian Prows, MD;  Location: [place] [facility];  Service: Cardiovascular;  Laterality: N/A;   LEFT HEART CATHETERIZATION WITH CORONARY ANGIOGRAM N/A 07/13/2012   Procedure: LEFT HEART CATHETERIZATION WITH CORONARY ANGIOGRAM;  Surgeon: Laverda Page, MD;  Location: [facility];  Service: Cardiovascular;  Laterality: N/A;   LEFT HEART CATHETERIZATION WITH CORONARY ANGIOGRAM N/A 07/11/2014   Procedure: LEFT HEART CATHETERIZATION WITH CORONARY ANGIOGRAM;  Surgeon: Laverda Page, MD;  Location: [facility] CATH LAB;  Service: Cardiovascular;  Laterality: N/A;   RETINAL DETACHMENT SURGERY Left 2010   RETINAL LASER PROCEDURE Right 2010   THYROIDECTOMY, PARTIAL  ~ [place]   Family History  Problem Relation Age of Onset   Stroke Mother 79   Atrial fibrillation Mother    Atrial fibrillation Father    Heart disease Sister    Pancreatic cancer Paternal Uncle    Mesothelioma Paternal Uncle     Social History   Tobacco Use   Smoking status: Never   Smokeless tobacco: Never  Substance Use Topics   Alcohol use: Yes    Comment: 1-2 beers, a few days a week  Marital Status: Married  ROS  Review of Systems  Cardiovascular:  Positive for dyspnea on exertion (stable) and palpitations (occasional). Negative for chest pain, leg swelling and syncope.  Objective  Blood pressure 137/73, pulse 83, temperature 97.8 F (36.6 C), temperature source Temporal, resp. rate 16, height 5' 9"  (1.753 m), weight 204 lb 3.2 oz (92.6 kg), SpO2 97 %.  Vitals with BMI 05/24/2021 02/04/2021 05/24/2020  Height 5' 9"  - 5' 9"   Weight 204 lbs 3 oz 205 lbs 8 oz 205 lbs  BMI 45.36 - 46.80   Systolic 321 224 825  Diastolic 73 50 63  Pulse 83 86 87     Physical Exam Vitals and nursing note reviewed.  Constitutional:      General: He is not in acute distress. Neck:     Vascular: No JVD.  Cardiovascular:     Rate and Rhythm: Normal rate and regular rhythm.     Pulses: Intact distal pulses.     Heart sounds: Normal heart sounds. No murmur heard. Pulmonary:     Effort: Pulmonary effort is normal.     Breath sounds: Normal breath sounds. No wheezing or rales.   Laboratory examination:   No results for input(s): NA, K, CL, CO2, GLUCOSE, BUN, CREATININE, CALCIUM, GFRNONAA, GFRAA in the last 8760 hours.  CrCl cannot be calculated (Patient's most recent lab result is older than the maximum 21 days allowed.).  CMP Latest Ref Rng & Units 08/19/2019 06/26/2017 10/29/2015  Glucose 65 - 99 mg/dL 97 203(H) 135(H)  BUN 8 - 27 mg/dL 14 12 14   Creatinine 0.76 - 1.27 mg/dL 1.04 0.91 0.97  Sodium 134 - 144 mmol/L 140 137 138  Potassium 3.5 - 5.2 mmol/L 4.7 4.2 4.8  Chloride 96 - 106 mmol/L 102 104 105  CO2 20 - 29 mmol/L 23 23 26   Calcium 8.6 - 10.2 mg/dL 9.7 9.2 9.6  Total Protein 6.5 - 8.1 g/dL - - 6.7  Total Bilirubin 0.3 - 1.2 mg/dL - - 1.0  Alkaline Phos 38 - 126 U/L - - 78  AST 15 - 41 U/L - - 32  ALT 17 - 63 U/L - - 32   CBC Latest Ref Rng & Units 08/19/2019 06/26/2017 10/29/2015  WBC 3.4 - 10.8 x10E3/uL 5.7 8.5 9.0  Hemoglobin 13.0 - 17.7 g/dL 16.5 15.7 14.9  Hematocrit 37.5 - 51.0 % 48.9 44.6 43.5  Platelets 150 - 450 x10E3/uL 240 236 235   Lipid Panel Recent Labs    05/29/20 0910  CHOL 144  TRIG 91  LDLCALC 67  HDL 60      External labs:   Labs 05/06/2021:  A1c 7.6%.  TSH 2.52, normal.  Serum glucose 142 mg, BUN 10, creatinine 0.94, EGFR 91 mL, potassium 5.1.  CMP normal.  Labs 01/04/2021:  Total cholesterol 134, triglycerides 94, HDL 61, LDL 56.  Non-HDL cholesterol 74.  TSH 2.180 01/02/2020  A1C 7.100 % 01/02/2020   Medications and allergies    Allergies  Allergen Reactions   Brilinta [Ticagrelor] Shortness Of Breath   Crestor [Rosuvastatin]     Other reaction(s): Unknown   Statins     Muscle pain, fatigue     Current Outpatient Medications on File Prior to Visit  Medication Sig Dispense Refill   acetaminophen (TYLENOL) 325 MG tablet Take 650 mg by mouth every 6 (six) hours as needed for moderate pain or headache.     albuterol (VENTOLIN HFA) 108 (90 Base) MCG/ACT inhaler Inhale 1 puff into  the lungs every 6 (six) hours as needed for wheezing or shortness of breath.      Alirocumab (PRALUENT) 150 MG/ML SOAJ INJECT ONE PEN INTO THE SKIN EVERY 14 DAYS 6 mL 1   aspirin 81 MG tablet Take 1 tablet (81 mg total) by mouth daily.     guaiFENesin (MUCINEX) 600 MG 12 hr tablet Take 600 mg by mouth 2 (two) times daily as needed (congestion).     ibuprofen (ADVIL) 600 MG tablet Take 1 tablet (600 mg total) by mouth every 6 (six) hours as needed. 30 tablet 0   insulin aspart (NOVOLOG) 100 UNIT/ML injection Inject into the skin See admin instructions. Uses via Insulin Pump     losartan (COZAAR) 25 MG tablet TAKE HALF A TABLET BY MOUTH DAILY 90 tablet 0   MELATONIN PO Take 1 tablet by mouth at bedtime as needed (sleep).     metoprolol tartrate (LOPRESSOR) 25 MG tablet TAKE 1/2 TABLET BY MOUTH TWICE A DAY AS NEEDED 180 tablet 0   Multiple Vitamins-Minerals (CENTRUM SILVER 50+MEN PO) Take 1 tablet by mouth daily.     nitroGLYCERIN (NITROSTAT) 0.4 MG SL tablet Place 1 tablet (0.4 mg total) under the tongue every 5 (five) minutes as needed for chest pain. 30 tablet 6   ranolazine (RANEXA) 500 MG 12 hr tablet TAKE 1 TABLET BY MOUTH TWICE A DAY 180 tablet 1   No current facility-administered medications on file prior to visit.    Radiology:   No results found.  Cardiac Studies:   Lexiscan Sestamibi stress test 09/04/2017:  1. Pharmacologic stress testing was performed with intravenous administration of .4 mg of Lexiscan over a 10-15 seconds  infusion. Stress symptoms included dyspnea and abdominal pain. Normal blood pressure. Exercise capacity not assessed. Stress EKG is non diagnostic for ischemia as it is a pharmacologic stress.  2. The overall quality of the study is excellent. There is no evidence of abnormal lung activity. Stress and rest SPECT images demonstrate homogeneous tracer distribution throughout the myocardium. Gated SPECT imaging reveals normal myocardial thickening and wall motion. The left ventricular ejection fraction was normal (81%).   3. Low risk study.  Coronary angiogram 06/26/2017: Normal LV systolic function, no regional wall motion of a mallet he. Widely patent mid LAD stent and mid circumflex stent and balloon angioplasty site to the ramus intermediate. The mid to distal LAD has a focal 60-70% stenosis. Apical LAD has a ulcerated 95-99% stenosis.  Echocardiogram 07/20/2019:  Left ventricle cavity is normal in size. Mild concentric hypertrophy of the left ventricle. Normal LV systolic function with EF 65%. Normal global wall motion. Normal diastolic filling pattern. Calculated EF 65%.  Mild (Grade I) mitral regurgitation.  Mild tricuspid regurgitation. Estimated pulmonary artery systolic pressure is 26 mmHg.  No significant change compared to previous study on 08/27/2017.  72-hour Holter monitor 2/8-2/04/2020:  Normal sinus rhythm.  Minimum heart rate 66 bpm, max heart rate 113 bpm.  4.73% tachycardia burden.  Occasional PVC (1.32% burden) in the form of isolated, couplets, and in a bigeminal pattern.  Rare PAC.  No reported symptoms.  No A. fib or SVT was noted.  CPX 08/01/2019: Exercise testing with gas exchange demonstrates low-normal functional capacity when compared to matched sedentary norms. There are no clear indications for cardiopulmonary limitations. At peak exercise there was normal O2 pulse.  Low normal functional capacity. No evidence of HF limitation. At peak exercise, there was lateral ST  depression suggestive of ischemia.   Left  Heart Catheterization 08/30/19:   The left ventricular systolic function is normal. LV end diastolic pressure is normal. LV: Normal LV systolic function.  Normal EDP. No change in coronary anatomy since 06/26/2017. Apical LAD ulcerated stenosis has completely healed. Small vessel disease and microvascular angina. Previous PTCA site widely patent: See below.  LAD stent placed on 06/15/2015 with 3 x 26 mm resolute stent. Ramus intermediate balloon angioplasty on 07/11/2014 and mid circumflex stent with a 4.0 x 15 mm integrity non-DES stent in 2012.   Recommendation: Patient's angina pectoris is related to small vessel disease and microvascular angina pectoris in view of uncontrolled diabetes mellitus.  I have reassured him.  50 mL contrast utilized.  EKG  EKG 05/24/2021: Normal sinus rhythm at rate of 81 bpm.  Normal EKG.  No significant change from 05/24/2020.  Assessment     ICD-10-CM   1. Coronary artery disease of native artery of native heart with stable angina pectoris (HCC)  I25.118 EKG 12-Lead       Recommendations:   Travis Fletcher.  is a 65 y.o. male  with coronary artery disease, type 1 diabetes mellitus which is fairly controlled now, hyperlipidemia, inability to tolerate statins due to statin myopathy, presently on Praluent presents here for annual visit.  Reviewed his external labs, diabetes is much improved but still >7.  His lipids are very well controlled on Praluent.  He starts Medicare in January 2023, he is worried about the cost of Praluent.  We will try to look into Leqvio.  Overall he is doing well from cardiac standpoint, blood pressures well controlled.  As he remained stable, I will see him back on annual basis.    Adrian Prows, MD, [facility] 05/24/2021, 10:29 AM Office: [phone] Pager: [phone]

## 2021-05-29 ENCOUNTER — Other Ambulatory Visit: Payer: Self-pay

## 2021-05-29 MED ORDER — RANOLAZINE ER 500 MG PO TB12: 500.0000 mg | ORAL_TABLET | Freq: Two times a day (BID) | ORAL | 1 refills | Status: DC

## 2021-05-29 MED ORDER — LOSARTAN POTASSIUM 25 MG PO TABS: ORAL_TABLET | ORAL | 0 refills | Status: DC

## 2021-05-29 MED ORDER — PRALUENT 150 MG/ML ~~LOC~~ SOAJ: SUBCUTANEOUS | 1 refills | Status: DC

## 2021-05-29 MED ORDER — METOPROLOL TARTRATE 25 MG PO TABS: ORAL_TABLET | ORAL | 0 refills | Status: DC

## 2021-06-11 DIAGNOSIS — M25511 Pain in right shoulder: Secondary | ICD-10-CM | POA: Diagnosis not present

## 2021-06-11 DIAGNOSIS — M79601 Pain in right arm: Secondary | ICD-10-CM | POA: Diagnosis not present

## 2021-06-11 DIAGNOSIS — M542 Cervicalgia: Secondary | ICD-10-CM | POA: Diagnosis not present

## 2021-06-14 DIAGNOSIS — M79601 Pain in right arm: Secondary | ICD-10-CM | POA: Diagnosis not present

## 2021-06-14 DIAGNOSIS — M25511 Pain in right shoulder: Secondary | ICD-10-CM | POA: Diagnosis not present

## 2021-06-14 DIAGNOSIS — M542 Cervicalgia: Secondary | ICD-10-CM | POA: Diagnosis not present

## 2021-06-17 DIAGNOSIS — M25511 Pain in right shoulder: Secondary | ICD-10-CM | POA: Diagnosis not present

## 2021-06-17 DIAGNOSIS — M79601 Pain in right arm: Secondary | ICD-10-CM | POA: Diagnosis not present

## 2021-06-17 DIAGNOSIS — M542 Cervicalgia: Secondary | ICD-10-CM | POA: Diagnosis not present

## 2021-06-21 DIAGNOSIS — M79601 Pain in right arm: Secondary | ICD-10-CM | POA: Diagnosis not present

## 2021-06-21 DIAGNOSIS — M542 Cervicalgia: Secondary | ICD-10-CM | POA: Diagnosis not present

## 2021-06-21 DIAGNOSIS — M25511 Pain in right shoulder: Secondary | ICD-10-CM | POA: Diagnosis not present

## 2021-06-24 ENCOUNTER — Other Ambulatory Visit: Payer: Self-pay | Admitting: Pharmacist

## 2021-06-24 ENCOUNTER — Ambulatory Visit: Payer: Self-pay | Admitting: Cardiology

## 2021-06-24 ENCOUNTER — Other Ambulatory Visit: Payer: Self-pay

## 2021-06-24 DIAGNOSIS — E7800 Pure hypercholesterolemia, unspecified: Secondary | ICD-10-CM

## 2021-06-25 MED ORDER — REPATHA SURECLICK 140 MG/ML ~~LOC~~ SOAJ: 1.0000 "application " | SUBCUTANEOUS | 5 refills | Status: DC

## 2021-06-28 DIAGNOSIS — M79601 Pain in right arm: Secondary | ICD-10-CM | POA: Diagnosis not present

## 2021-06-28 DIAGNOSIS — M25511 Pain in right shoulder: Secondary | ICD-10-CM | POA: Diagnosis not present

## 2021-06-28 DIAGNOSIS — M542 Cervicalgia: Secondary | ICD-10-CM | POA: Diagnosis not present

## 2021-06-29 DIAGNOSIS — M5412 Radiculopathy, cervical region: Secondary | ICD-10-CM | POA: Diagnosis not present

## 2021-06-29 DIAGNOSIS — M542 Cervicalgia: Secondary | ICD-10-CM | POA: Diagnosis not present

## 2021-07-01 DIAGNOSIS — M5412 Radiculopathy, cervical region: Secondary | ICD-10-CM | POA: Diagnosis not present

## 2021-07-01 DIAGNOSIS — M542 Cervicalgia: Secondary | ICD-10-CM | POA: Diagnosis not present

## 2021-07-02 ENCOUNTER — Ambulatory Visit: Payer: Self-pay | Admitting: Orthopedic Surgery

## 2021-07-02 DIAGNOSIS — Z01818 Encounter for other preprocedural examination: Secondary | ICD-10-CM

## 2021-07-05 ENCOUNTER — Encounter: Payer: Self-pay | Admitting: Cardiology

## 2021-07-08 ENCOUNTER — Other Ambulatory Visit: Payer: Self-pay

## 2021-07-08 MED ORDER — REPATHA SURECLICK 140 MG/ML ~~LOC~~ SOAJ: 1.0000 "application " | SUBCUTANEOUS | 5 refills | Status: DC

## 2021-07-09 ENCOUNTER — Ambulatory Visit: Payer: Self-pay | Admitting: Orthopedic Surgery

## 2021-07-09 DIAGNOSIS — M5412 Radiculopathy, cervical region: Secondary | ICD-10-CM | POA: Diagnosis not present

## 2021-07-09 DIAGNOSIS — Z01818 Encounter for other preprocedural examination: Secondary | ICD-10-CM | POA: Diagnosis not present

## 2021-07-09 NOTE — H&P (Deleted)
  The note originally documented on this encounter has been moved the the encounter in which it belongs.  

## 2021-07-09 NOTE — H&P (Signed)
Subjective:   PMH: Type 1 Diabetes, most recent A1c 7.4; uses insulin pump. Has cardiac stenst many years ago, uses ASA CC: Right arm pain, right Biceps,wrist weakness TDR C5-6 [place] 07/17/21  Patient Active Problem List   Diagnosis Date Noted   History of prostate cancer 03/22/2020   Essential hypertension 03/22/2020   Type 1 diabetes mellitus with complications ([place]) 17/91/5056   Angina pectoris ([place])    CAD (coronary artery disease), native coronary artery 07/24/2018   Hypercholesteremia 07/24/2018   Statin intolerance 07/24/2018   Dyspnea 01/29/2018   Post PTCA 07/11/2014   Mild non proliferative diabetic retinopathy ([place]) 11/28/2012   Past Medical History:  Diagnosis Date   Anginal pain ([place])    Arthritis    "hands" (07/11/2014)   CAD (coronary artery disease), native coronary artery 07/24/2018   Coronary artery disease    High cholesterol    Hypercholesteremia 07/24/2018   Kidney stones    "passed them all"   Neuromuscular disorder ([place])    neuropathy   Prostate cancer ([place]) 2010   Statin intolerance 07/24/2018   Type 1 diabetes mellitus with other specified complication [facility])     Past Surgical History:  Procedure Laterality Date   BALLOON ANGIOPLASTY, ARTERY  07/13/2012   CARDIAC CATHETERIZATION N/A 06/15/2015   Procedure: Left Heart Cath and Coronary Angiography;  Surgeon: Adrian Prows, MD;  Location: [place] CV LAB;  Service: Cardiovascular;  Laterality: N/A;   CARDIAC CATHETERIZATION N/A 06/15/2015   Procedure: Coronary Stent Intervention;  Surgeon: Adrian Prows, MD;  Location: [place] [facility];  Service: Cardiovascular;  Laterality: N/A;   CATARACT EXTRACTION W/ INTRAOCULAR LENS  IMPLANT, BILATERAL Bilateral ~ 2000   CORONARY ANGIOPLASTY  07/11/2014   CORONARY ANGIOPLASTY WITH STENT PLACEMENT  2012   "1"   EYE SURGERY     INGUINAL HERNIA REPAIR Left 1987   INSERTION PROSTATE RADIATION SEED  2010   LEFT HEART CATH AND CORONARY ANGIOGRAPHY N/A 06/26/2017    Procedure: LEFT HEART CATH AND CORONARY ANGIOGRAPHY;  Surgeon: Adrian Prows, MD;  Location: [place] [facility];  Service: Cardiovascular;  Laterality: N/A;   LEFT HEART CATH AND CORONARY ANGIOGRAPHY N/A 08/30/2019   Procedure: LEFT HEART CATH AND CORONARY ANGIOGRAPHY;  Surgeon: Adrian Prows, MD;  Location: [place] [facility];  Service: Cardiovascular;  Laterality: N/A;   LEFT HEART CATHETERIZATION WITH CORONARY ANGIOGRAM N/A 07/13/2012   Procedure: LEFT HEART CATHETERIZATION WITH CORONARY ANGIOGRAM;  Surgeon: Laverda Page, MD;  Location[facility] CATH LAB;  Service: Cardiovascular;  Laterality: N/A;   LEFT HEART CATHETERIZATION WITH CORONARY ANGIOGRAM N/A 07/11/2014   Procedure: LEFT HEART CATHETERIZATION WITH CORONARY ANGIOGRAM;  Surgeon: Laverda Page, MD;  Location: [facility] CATH LAB;  Service: Cardiovascular;  Laterality: N/A;   RETINAL DETACHMENT SURGERY Left 2010   RETINAL LASER PROCEDURE Right 2010   THYROIDECTOMY, PARTIAL  ~ [place]    Current Outpatient Medications  Medication Sig Dispense Refill Last Dose   acetaminophen (TYLENOL) 325 MG tablet Take 650 mg by mouth every 6 (six) hours as needed for moderate pain or headache.      albuterol (VENTOLIN HFA) 108 (90 Base) MCG/ACT inhaler Inhale 1 puff into the lungs every 6 (six) hours as needed for wheezing or shortness of breath.       aspirin 81 MG tablet Take 1 tablet (81 mg total) by mouth daily.      Evolocumab (REPATHA SURECLICK) 979 MG/ML SOAJ Inject 1 application  into the skin every 14 (fourteen) days. 2 mL 5    guaiFENesin (MUCINEX) 600 MG 12 hr tablet Take 600 mg by mouth 2 (two) times daily as needed (congestion).      ibuprofen (ADVIL) 600 MG tablet Take 1 tablet (600 mg total) by mouth every 6 (six) hours as needed. 30 tablet 0    insulin aspart (NOVOLOG) 100 UNIT/ML injection Inject into the skin See admin instructions. Uses via Insulin Pump      losartan (COZAAR) 25 MG tablet TAKE HALF A TABLET BY  MOUTH DAILY 90 tablet 0    MELATONIN PO Take 1 tablet by mouth at bedtime as needed (sleep).      metoprolol tartrate (LOPRESSOR) 25 MG tablet TAKE 1/2 TABLET BY MOUTH TWICE A DAY AS NEEDED 180 tablet 0    Multiple Vitamins-Minerals (CENTRUM SILVER 50+MEN PO) Take 1 tablet by mouth daily.      nitroGLYCERIN (NITROSTAT) 0.4 MG SL tablet Place 1 tablet (0.4 mg total) under the tongue every 5 (five) minutes as needed for chest pain. 30 tablet 6    ranolazine (RANEXA) 500 MG 12 hr tablet Take 1 tablet (500 mg total) by mouth 2 (two) times daily. 180 tablet 1    No current facility-administered medications for this visit.   Allergies  Allergen Reactions   Brilinta [Ticagrelor] Shortness Of Breath   Crestor [Rosuvastatin]     Other reaction(s): Unknown   Statins     Muscle pain, fatigue     Social History   Tobacco Use   Smoking status: Never   Smokeless tobacco: Never  Substance Use Topics   Alcohol use: Yes    Comment: 1-2 beers, a few days a week    Family History  Problem Relation Age of Onset   Stroke Mother 56   Atrial fibrillation Mother    Atrial fibrillation Father    Heart disease Sister    Pancreatic cancer Paternal Uncle    Mesothelioma Paternal Uncle     Review of Systems Pertinent items are noted in HPI.  Objective:   Vitals: Ht: 5 ft 9.75 in 07/09/2021 10:31 am BP: 138/78 07/09/2021 10:36 am Pulse: 82 bpm 07/09/2021 10:36 am  Clinical exam: Travis Fletcher is a pleasant individual, who appears younger than their stated age. He is alert and orientated 3. No shortness of breath, chest pain. Heart: Regular rate and rhythm, no rubs, murmurs, or gallops Lungs: Clear to auscultation bilaterally Abdomen is soft and non-tender, negative loss of bowel and bladder control, no rebound tenderness. Bowel sounds 4 Negative: skin lesions abrasions contusions Peripheral pulses: 2+ radial artery pulses bilaterally. Compartment soft and nontender. Gait pattern: normal Assistive  devices: None Neuro: 4/5 right bicep and wrist extensor strength. 5/5 motor strength in the remainder of the right upper extremity. 5/5 motor strength in the left upper extremity. Negative Hoffman test, negative inverted brachioradialis reflex. Positive Spurling sign with reproduction of right C6 pain. Negative Lhermitte sign. 1+ deep tendon reflexes symmetrically in the upper extremity. Musculoskeletal: Moderate neck pain with range of motion and palpation. Pain does radiate into the right scapula/trapezius and into the right C6 dermatome. No shoulder, wrist, and elbow pain with isolated joint range of motion. Imaging: Cervical x-rays are unremarkable. Normal sagittal alignment is maintained. No fracture or significant collapse of the disc spaces are noted. No significant facet arthrosis is noted. Cervical MRI: completed on 03/28/2021 was reviewed with the patient. It was completed at [facility]; I have independently reviewed the images  as well as the radiology report. No significant spinal canal or foraminal stenosis C2-5 or C6-7. Normal cord signal. Large right disc herniation at C5-6 with compression of the exiting C6 nerve root.    Assessment:   Travis Fletcher is a pleasant 66 year old gentleman with 20-month history of severe neck and radicular right arm pain. He has C6 weakness (bicep/wrist extensor) as well as numbness and dysesthesias in the C6 distribution. He has a positive nerve root tension sign but no evidence of myelopathy. Imaging studies confirmed a large right-sided disc herniation causing compression of the C6 nerve root. At this point time because of the neurological deficits and severe pain he is referred to me for definitive treatment. At this point I do think surgical intervention is reasonable.  I have gone over the surgical procedure in great detail which would be a C5-6 disc arthroplasty. All of his questions were addressed. We have also reviewed the risks, benefits, and alternatives  to surgery. Risks and benefits of surgery were discussed with the patient. These include: Infection, bleeding, death, stroke, paralysis, ongoing or worse pain, need for additional surgery, nonunion, leak of spinal fluid, adjacent segment degeneration requiring additional fusion surgery. Pseudoarthrosis (nonunion)requiring supplemental posterior fixation. Throat pain, swallowing difficulties, hoarseness or change in voice. Heterotopic ossification, inability to place the disc due to technical issues requiring bailout to a fusion procedure.  Additional risk factors: Tobacco use: None Diabetes: Positive history of diabetes. Most recent A1c is 7.4 Obesity: BMI of 28.2. (Within normal limits) Cardiac history of coronary artery disease. The patient is status post several stents. Only using aspirin as anticoagulation.   Plan:   We have received clearance from his cardiologist. He will hold his aspirin 7 days prior to surgery we will plan to restart 24-48 hours postop. At this point I do not have clearance from his endocrinologist/PCP. He will reach out to this provider.  He does take Aleve when necessary pain I have advised him to hold this 7 days prior to surgery and avoid any other NSAIDs. He may take Tylenol for pain. He will also hold his multivitamin.  He was given a soft cervical collar at today's office visit. He will bring this with him the day of surgery.  He has his preop assessment scheduled [facility]  Discharge instructions were reviewed with the patient today he was given a printout.  We have also discussed the post-operative recovery period to include: bathing/showering restrictions, wound healing, activity (and driving) restrictions, medications/pain mangement.  We have also discussed post-operative redflags to include: signs and symptoms of postoperative infection, DVT/PE.  All of his questions were invited and answered.  Follow-up: 2 weeks postop.

## 2021-07-12 NOTE — Progress Notes (Signed)
Surgical Instructions   Your procedure is scheduled on Wednesday 07/17/2021.  Report to [facility] Main Entrance "A" at 11:00 A.M., then check in with the Admitting office.  Call [phone] if you have problems or questions between now and the morning of surgery:   Remember: Do not eat after midnight the night before your surgery  You may drink clear liquids until 10:00 a.m. the morning of your surgery.   Clear liquids allowed are: Water, Non-Citrus Juices (without pulp), Carbonated Beverages, Clear Tea, Black Coffee Only (NO MILK, CREAM, or POWDERED CREAMER of any kind), and Gatorade   Take these medicines the morning of surgery with A SIP OF WATER:  Metoprolol tartrate (Lopressor) Ranolazine (Ranexa)   If needed you may take these medications the morning of surgery: Acetaminophen (Tylenol) Albuterol (Ventolin) inhaler - Please bring all inhalers with you the day of surgery Hydrocodone-acetaminophen (Norco) Nitroglycerin (Nitrostat) Polyethyl Glycol-Propyl Glycol (Systane OP) eye drops  As of today, STOP taking any Aspirin (unless otherwise instructed by your surgeon) or Aspirin-containing products; NSAIDS - Aleve, Naproxen, Ibuprofen, Motrin, Advil, Goody's, BC's, all herbal medications, fish oil, and all vitamins.  WHAT DO I DO ABOUT MY DIABETES MEDICATION?  Reduce your basal rate by 20% at midnight the night before your surgery.  If your CBG is greater than 220 mg/dL, you may take  of your sliding scale (correction) dose of insulin.   HOW TO MANAGE YOUR DIABETES BEFORE AND AFTER SURGERY  Why is it important to control my blood sugar before and after surgery? Improving blood sugar levels before and after surgery helps healing and can limit problems. A way of improving blood sugar control is eating a healthy diet by:  Eating less sugar and carbohydrates  Increasing activity/exercise  Talking with your doctor about reaching your blood sugar goals High blood sugars  (greater than 180 mg/dL) can raise your risk of infections and slow your recovery, so you will need to focus on controlling your diabetes during the weeks before surgery. Make sure that the doctor who takes care of your diabetes knows about your planned surgery including the date and location.  How do I manage my blood sugar before surgery? Check your blood sugar at least 4 times a day, starting 2 days before surgery, to make sure that the level is not too high or low.  Check your blood sugar the morning of your surgery when you wake up and every 2 hours until you get to the Short Stay unit.  If your blood sugar is less than 70 mg/dL, you will need to treat for low blood sugar: Do not take insulin. Treat a low blood sugar (less than 70 mg/dL) with  cup of clear juice (cranberry or apple), 4 glucose tablets, OR glucose gel. Recheck blood sugar in 15 minutes after treatment (to make sure it is greater than 70 mg/dL). If your blood sugar is not greater than 70 mg/dL on recheck, call [phone] for further instructions. Report your blood sugar to the short stay nurse when you get to Short Stay.  If you are admitted to the hospital after surgery: Your blood sugar will be checked by the staff and you will probably be given insulin after surgery (instead of oral diabetes medicines) to make sure you have good blood sugar levels. The goal for blood sugar control after surgery is 80-180 mg/dL.    After your pre-procedure COVID test  You are not required to quarantine however you are required to wear a well-fitting  mask when you are out and around people not in your household.  If your mask becomes wet or soiled, replace with a new one.  Wash your hands often with soap and water for 20 seconds or clean your hands with an alcohol-based hand sanitizer that contains at least 60% alcohol.  Do not share personal items.  Notify your provider: if you are in close contact with someone who has COVID  or  if you develop a fever of 100.4 or greater, sneezing, cough, sore throat, shortness of breath or body aches.           Do not wear jewelry  Do not wear lotions, powders, colognes, or deodorant.  Do not shave 48 hours prior to surgery.  Men may shave face and neck.  Do not bring valuables to the hospital - [facility] is not responsible for any belongings or valuables.  Do NOT Smoke (Tobacco/Vaping) or drink Alcohol 24 hours prior to your procedure  If you use a CPAP at night, please bring your mask for your overnight stay.   Contacts, glasses, hearing aids, dentures or partials may not be worn into surgery, please bring cases for these belongings   For patients admitted to the hospital, discharge time will be determined by your treatment team.   Patients discharged the day of surgery will not be allowed to drive home, and someone needs to stay with them for 24 hours.  NO VISITORS WILL BE ALLOWED IN PRE-OP WHERE PATIENTS ARE PREPPED FOR SURGERY.  ONLY 1 SUPPORT PERSON MAY BE PRESENT IN THE WAITING ROOM WHILE YOU ARE IN SURGERY.  IF YOU ARE TO BE ADMITTED, ONCE YOU ARE IN YOUR ROOM YOU WILL BE ALLOWED TWO (2) VISITORS. 1 (ONE) VISITOR MAY STAY OVERNIGHT BUT MUST ARRIVE TO THE ROOM BY 8pm.  Minor children may have two parents present. Special consideration for safety and communication needs will be reviewed on a case by case basis.  Special instructions:    Oral Hygiene is also important to reduce your risk of infection.  Remember - BRUSH YOUR TEETH THE MORNING OF SURGERY WITH YOUR REGULAR TOOTHPASTE   [place]- Preparing For Surgery  Before surgery, you can play an important role. Because skin is not sterile, your skin needs to be as free of germs as possible. You can reduce the number of germs on your skin by washing with CHG (chlorahexidine gluconate) Soap before surgery.  CHG is an antiseptic cleaner which kills germs and bonds with the skin to continue killing germs even after  washing.     Please do not use if you have an allergy to CHG or antibacterial soaps. If your skin becomes reddened/irritated stop using the CHG.  Do not shave (including legs and underarms) for at least 48 hours prior to first CHG shower. It is OK to shave your face.  Please follow these instructions carefully.     Shower the NIGHT BEFORE SURGERY and the MORNING OF SURGERY with CHG Soap.   If you chose to wash your hair, wash your hair first as usual with your normal shampoo. After you shampoo, rinse your hair and body thoroughly to remove the shampoo.    Then [employer] and genitals (private parts) with your normal soap and rinse thoroughly to remove soap.  Next use the CHG Soap as you would any other liquid soap. You can apply CHG directly to the skin and wash gently with a clean washcloth.   Apply the CHG Soap  to your body ONLY FROM THE NECK DOWN.  Do not use on open wounds or open sores. Avoid contact with your eyes, ears, mouth and genitals (private parts). Wash Face and genitals (private parts)  with your normal soap.   Wash thoroughly, paying special attention to the area where your surgery will be performed.  Thoroughly rinse your body with warm water from the neck down.  DO NOT shower/wash with your normal soap after using and rinsing off the CHG Soap.  Pat yourself dry with a CLEAN TOWEL.  Wear CLEAN PAJAMAS to bed the night before surgery  Place CLEAN SHEETS on your bed the night before your surgery  DO NOT SLEEP WITH PETS.   Day of Surgery:  Take a shower with CHG soap. Wear Clean/Comfortable clothing the morning of surgery Do not apply any deodorants/lotions.   Remember to brush your teeth WITH YOUR REGULAR TOOTHPASTE.   Please read over the fact sheets that you were given.

## 2021-07-15 ENCOUNTER — Encounter (HOSPITAL_COMMUNITY)
Admission: RE | Admit: 2021-07-15 | Discharge: 2021-07-15 | Disposition: A | Discharge status: Home / Self Care | Payer: Medicare HMO | Source: Ambulatory Visit | Attending: Orthopedic Surgery | Admitting: Orthopedic Surgery

## 2021-07-15 ENCOUNTER — Other Ambulatory Visit: Payer: Self-pay

## 2021-07-15 ENCOUNTER — Encounter (HOSPITAL_COMMUNITY): Payer: Self-pay

## 2021-07-15 VITALS — BP 124/80 | HR 79 | Temp 98.4°F | Resp 17 | Ht 69.0 in | Wt 205.0 lb

## 2021-07-15 DIAGNOSIS — Z955 Presence of coronary angioplasty implant and graft: Secondary | ICD-10-CM | POA: Insufficient documentation

## 2021-07-15 DIAGNOSIS — Z9641 Presence of insulin pump (external) (internal): Secondary | ICD-10-CM | POA: Diagnosis not present

## 2021-07-15 DIAGNOSIS — Z01818 Encounter for other preprocedural examination: Secondary | ICD-10-CM

## 2021-07-15 DIAGNOSIS — Z7982 Long term (current) use of aspirin: Secondary | ICD-10-CM | POA: Insufficient documentation

## 2021-07-15 DIAGNOSIS — I2510 Atherosclerotic heart disease of native coronary artery without angina pectoris: Secondary | ICD-10-CM | POA: Diagnosis not present

## 2021-07-15 DIAGNOSIS — Z01812 Encounter for preprocedural laboratory examination: Secondary | ICD-10-CM | POA: Diagnosis not present

## 2021-07-15 DIAGNOSIS — Z794 Long term (current) use of insulin: Secondary | ICD-10-CM | POA: Diagnosis not present

## 2021-07-15 DIAGNOSIS — E108 Type 1 diabetes mellitus with unspecified complications: Secondary | ICD-10-CM | POA: Insufficient documentation

## 2021-07-15 DIAGNOSIS — Z20822 Contact with and (suspected) exposure to covid-19: Secondary | ICD-10-CM | POA: Insufficient documentation

## 2021-07-15 HISTORY — DX: Personal history of other diseases of the respiratory system: Z87.09

## 2021-07-15 HISTORY — DX: Unspecified asthma, uncomplicated: J45.909

## 2021-07-15 HISTORY — DX: Personal history of urinary calculi: Z87.442

## 2021-07-15 LAB — CBC
HCT: 47.4 % (ref 39.0–52.0)
Hemoglobin: 16.3 g/dL (ref 13.0–17.0)
MCH: 32.5 pg (ref 26.0–34.0)
MCHC: 34.4 g/dL (ref 30.0–36.0)
MCV: 94.4 fL (ref 80.0–100.0)
Platelets: 228 10*3/uL (ref 150–400)
RBC: 5.02 MIL/uL (ref 4.22–5.81)
RDW: 11.8 % (ref 11.5–15.5)
WBC: 8 10*3/uL (ref 4.0–10.5)
nRBC: 0 % (ref 0.0–0.2)

## 2021-07-15 LAB — HEMOGLOBIN A1C
Hgb A1c MFr Bld: 6.9 % — ABNORMAL HIGH (ref 4.8–5.6)
Mean Plasma Glucose: 151.33 mg/dL

## 2021-07-15 LAB — SURGICAL PCR SCREEN
MRSA, PCR: NEGATIVE
Staphylococcus aureus: POSITIVE — AB

## 2021-07-15 LAB — URINALYSIS, COMPLETE (UACMP) WITH MICROSCOPIC
Bilirubin Urine: NEGATIVE
Glucose, UA: NEGATIVE mg/dL
Hgb urine dipstick: NEGATIVE
Ketones, ur: NEGATIVE mg/dL
Leukocytes,Ua: NEGATIVE
Nitrite: NEGATIVE
Protein, ur: NEGATIVE mg/dL
Specific Gravity, Urine: >1.03 — ABNORMAL HIGH (ref 1.005–1.030)
pH: 5.5 (ref 5.0–8.0)

## 2021-07-15 LAB — BASIC METABOLIC PANEL
Anion gap: 8 (ref 5–15)
BUN: 13 mg/dL (ref 8–23)
CO2: 30 mmol/L (ref 22–32)
Calcium: 9.3 mg/dL (ref 8.9–10.3)
Chloride: 98 mmol/L (ref 98–111)
Creatinine, Ser: 0.94 mg/dL (ref 0.61–1.24)
GFR, Estimated: >60 mL/min (ref >60)
Glucose, Bld: 178 mg/dL — ABNORMAL HIGH (ref 70–99)
Potassium: 4.3 mmol/L (ref 3.5–5.1)
Sodium: 136 mmol/L (ref 135–145)

## 2021-07-15 LAB — GLUCOSE, CAPILLARY: Glucose-Capillary: 185 mg/dL — ABNORMAL HIGH (ref 70–99)

## 2021-07-15 LAB — SARS CORONAVIRUS 2 (TAT 6-24 HRS): SARS Coronavirus 2: NEGATIVE

## 2021-07-15 NOTE — Progress Notes (Signed)
Left message for Ellie Lunch at Dr. Rolena Infante' office regarding abnormal UA.

## 2021-07-15 NOTE — Progress Notes (Signed)
PCP - Dr. Kelton Pillar Cardiologist - Dr. Adrian Prows Endocrinologist - Dr. Delrae Rend  PPM/ICD - n/a Device Orders -  Rep Notified -   Chest x-ray - n/a EKG - 05/24/21 Stress Test - 08/01/19 ECHO - 07/20/19 Cardiac Cath - 08/30/19  Sleep Study - n/a CPAP -   Fasting Blood Sugar - 100s Checks Blood Sugar 3-4  times a day Patient on insulin pump, instructed to reduce basal rate by 20% at midnight and keep pump on.  DM coordinator notified.  Blood Thinner Instructions: Aspirin Instructions:  ERAS Protcol - clears until 10:00am per anesthesia PRE-SURGERY Ensure or G2-   COVID TEST- at PAT appointment   Anesthesia review: history of CAD; DM1 on insulin pump;   Patient denies shortness of breath, fever, cough and chest pain at PAT appointment   All instructions explained to the patient, with a verbal understanding of the material. Patient agrees to go over the instructions while at home for a better understanding. Patient also instructed to self quarantine after being tested for COVID-19. The opportunity to ask questions was provided.

## 2021-07-16 NOTE — Progress Notes (Signed)
Anesthesia Chart Review:  Follows with cardiologist Dr. Einar Gip for history of CAD s/p DES to mid LAD 2017.  Last seen 05/24/2021 and noted to be doing well from cardiac standpoint.  Recommended follow-up in 1 year.  Preop clearance per letter dated 07/05/2021 states, "Travis Fletcher. is at low risk, from a cardiac standpoint, for his upcoming procedure: Total disc replacement C5-6.  It is ok to proceed without further cardiac testing. If applicable can hold ASA for 7  day(s) prior to procedure and re-start at the earliest days post procedure."  Type 1 DM well-controlled, preop A1c 6.9.  Uses insulin pump.  Followed by endocrinologist Dr. Buddy Duty.  Preop labs reviewed, unremarkable.  EKG 05/24/2021: Normal sinus rhythm at rate of 81 bpm.  Normal EKG.  No significant change from 05/24/2020.   Left Heart Catheterization 08/30/19:   The left ventricular systolic function is normal. LV end diastolic pressure is normal. LV: Normal LV systolic function.  Normal EDP. No change in coronary anatomy since 06/26/2017. Apical LAD ulcerated stenosis has completely healed. Small vessel disease and microvascular angina. Previous PTCA site widely patent: See below.  LAD stent placed on 06/15/2015 with 3 x 26 mm resolute stent. Ramus intermediate balloon angioplasty on 07/11/2014 and mid circumflex stent with a 4.0 x 15 mm integrity non-DES stent in 2012.   Recommendation: Patient's angina pectoris is related to small vessel disease and microvascular angina pectoris in view of uncontrolled diabetes mellitus.  I have reassured him.  50 mL contrast utilized.  CPX 08/01/2019: Exercise testing with gas exchange demonstrates low-normal functional capacity when compared to matched sedentary norms. There are no clear indications for cardiopulmonary limitations. At peak exercise there was normal O2 pulse.  Low normal functional capacity. No evidence of HF limitation. At peak exercise, there was lateral ST  depression suggestive of ischemia.  72-hour Holter monitor 2/8-2/04/2020:  Normal sinus rhythm.  Minimum heart rate 66 bpm, max heart rate 113 bpm.  4.73% tachycardia burden.  Occasional PVC (1.32% burden) in the form of isolated, couplets, and in a bigeminal pattern.  Rare PAC.  No reported symptoms.  No A. fib or SVT was noted.  Echocardiogram 07/20/2019:  Left ventricle cavity is normal in size. Mild concentric hypertrophy of the left ventricle. Normal LV systolic function with EF 65%. Normal global wall motion. Normal diastolic filling pattern. Calculated EF 65%.  Mild (Grade I) mitral regurgitation.  Mild tricuspid regurgitation. Estimated pulmonary artery systolic pressure is 26 mmHg.  No significant change compared to previous study on 08/27/2017.  Coronary angiogram 06/26/2017: Normal LV systolic function, no regional wall motion of a mallet he. Widely patent mid LAD stent and mid circumflex stent and balloon angioplasty site to the ramus intermediate. The mid to distal LAD has a focal 60-70% stenosis. Apical LAD has a ulcerated 95-99% stenosis.  Lexiscan Sestamibi stress test 09/04/2017:  1. Pharmacologic stress testing was performed with intravenous administration of .4 mg of Lexiscan over a 10-15 seconds infusion. Stress symptoms included dyspnea and abdominal pain. Normal blood pressure. Exercise capacity not assessed. Stress EKG is non diagnostic for ischemia as it is a pharmacologic stress.  2. The overall quality of the study is excellent. There is no evidence of abnormal lung activity. Stress and rest SPECT images demonstrate homogeneous tracer distribution throughout the myocardium. Gated SPECT imaging reveals normal myocardial thickening and wall motion. The left ventricular ejection fraction was normal (81%).   3. Low risk study.  [name] [facility] Short Stay Center/Anesthesiology Phone [phone] 07/16/2021 1:29 PM

## 2021-07-16 NOTE — Anesthesia Preprocedure Evaluation (Addendum)
Anesthesia Evaluation  Patient identified by MRN, date of birth, ID band Patient awake    Reviewed: Allergy & Precautions, H&P , NPO status , Patient's Chart, lab work & pertinent test results  Airway Mallampati: II   Neck ROM: full    Dental   Pulmonary shortness of breath, asthma ,    breath sounds clear to auscultation       Cardiovascular hypertension, + angina + CAD and + Cardiac Stents   Rhythm:regular Rate:Normal     Neuro/Psych  Neuromuscular disease    GI/Hepatic   Endo/Other  diabetes, Insulin Dependent  Renal/GU stones     Musculoskeletal  (+) Arthritis ,   Abdominal   Peds  Hematology   Anesthesia Other Findings   Reproductive/Obstetrics                            Anesthesia Physical Anesthesia Plan  ASA: 3  Anesthesia Plan: General   Post-op Pain Management:    Induction: Intravenous  PONV Risk Score and Plan: 2 and Ondansetron, Dexamethasone, Midazolam and Treatment may vary due to age or medical condition  Airway Management Planned: Oral ETT  Additional Equipment:   Intra-op Plan:   Post-operative Plan: Extubation in OR  Informed Consent: I have reviewed the patients History and Physical, chart, labs and discussed the procedure including the risks, benefits and alternatives for the proposed anesthesia with the patient or authorized representative who has indicated his/her understanding and acceptance.     Dental advisory given  Plan Discussed with: CRNA, Anesthesiologist and Surgeon  Anesthesia Plan Comments: (PAT note by Karoline Caldwell, PA-C: Follows with cardiologist Dr. Einar Gip for history of CAD s/p DES to mid LAD 2017.  Last seen 05/24/2021 and noted to be doing well from cardiac standpoint.  Recommended follow-up in 1 year.  Preop clearance per letter dated 07/05/2021 states, "Alani Lacivita Vonbehren Jr.is at low risk, from a cardiac standpoint, for  hisupcoming procedure: Total disc replacement C5-6. It is ok to proceed without further cardiac testing. If applicable can hold ASAfor 7day(s) prior to procedure and re-start at the earliestdays post procedure."  Type 1 DM well-controlled, preop A1c 6.9.  Uses insulin pump.  Followed by endocrinologist Dr. Buddy Duty.  Preop labs reviewed, unremarkable.  EKG 05/24/2021: Normal sinus rhythm at rate of 81 bpm.  Normal EKG.  No significant change from 05/24/2020.  Left Heart Catheterization 08/30/19:  The left ventricular systolic function is normal.  LV end diastolic pressure is normal. LV: Normal LV systolic function. Normal EDP. No change in coronary anatomy since 06/26/2017. Apical LAD ulcerated stenosis has completely healed. Small vessel disease and microvascular angina. Previous PTCA site widely patent: See below.  LAD stent placed on 06/15/2015 with 3 x 26 mm resolute stent. Ramus intermediate balloon angioplasty on 07/11/2014 and mid circumflex stent with a 4.0 x 15 mm integrity non-DES stent in 2012.  Recommendation: Patient's angina pectoris is related to small vessel disease and microvascular angina pectoris in view of uncontrolled diabetes mellitus. I have reassured him. 50 mL contrast utilized.  CPX 08/01/2019: Exercise testing with gas exchange demonstrates low-normal functional capacity when compared to matched sedentary norms. There are no clear indications for cardiopulmonary limitations. At peak exercise there was normal O2 pulse.  Low normal functional capacity. No evidence of HF limitation. At peak exercise, there was lateral ST depression suggestive of ischemia.  72-hour Holter monitor 2/8-2/04/2020:  Normal sinus rhythm. Minimum heart rate 66 bpm, max  heart rate 113 bpm. 4.73% tachycardia burden. Occasional PVC (1.32% burden) in the form of isolated, couplets, and in a bigeminal pattern. Rare PAC. No reported symptoms. No A. fib or SVT was  noted.  Echocardiogram 07/20/2019: Left ventricle cavity is normal in size. Mild concentric hypertrophy of the left ventricle. Normal LV systolic function with EF 65%. Normal global wall motion. Normal diastolic filling pattern. Calculated EF 65%.  Mild (Grade I) mitral regurgitation.  Mild tricuspid regurgitation. Estimated pulmonary artery systolic pressure is 26 mmHg.  No significant change compared to previous study on 08/27/2017.  Coronary angiogram 92/92/4462:MMNOTR LV systolic function, no regional wall motion of a mallet he. Widely patent mid LAD stent and mid circumflex stent and balloon angioplasty site to the ramus intermediate. The mid to distal LAD has a focal 60-70% stenosis. Apical LAD has a ulcerated 95-99% stenosis.  Lexiscan Sestamibi stress test 09/04/2017:  1. Pharmacologic stress testing was performed with intravenous administration of .4 mg of Lexiscan over a 10-15 seconds infusion. Stress symptoms included dyspnea and abdominal pain. Normal blood pressure. Exercise capacity not assessed. Stress EKG is non diagnostic for ischemia as it is a pharmacologic stress.  2. The overall quality of the study is excellent. There is no evidence of abnormal lung activity. Stress and rest SPECT images demonstrate homogeneous tracer distribution throughout the myocardium. Gated SPECT imaging reveals normal myocardial thickening and wall motion. The left ventricular ejection fraction was normal (81%).  3. Low risk study. )       Anesthesia Quick Evaluation

## 2021-07-17 ENCOUNTER — Ambulatory Visit (HOSPITAL_COMMUNITY): Payer: Medicare HMO

## 2021-07-17 ENCOUNTER — Other Ambulatory Visit: Payer: Self-pay

## 2021-07-17 ENCOUNTER — Ambulatory Visit (HOSPITAL_COMMUNITY)
Admission: RE | Disposition: A | Discharge status: Home / Self Care | Payer: Self-pay | Source: Home / Self Care | Attending: Orthopedic Surgery

## 2021-07-17 ENCOUNTER — Ambulatory Visit (HOSPITAL_COMMUNITY): Payer: Medicare HMO | Admitting: Vascular Surgery

## 2021-07-17 ENCOUNTER — Observation Stay (HOSPITAL_COMMUNITY)
Admission: RE | Admit: 2021-07-17 | Discharge: 2021-07-18 | Disposition: A | Discharge status: Home / Self Care | Payer: Medicare HMO | Attending: Orthopedic Surgery | Admitting: Orthopedic Surgery

## 2021-07-17 ENCOUNTER — Ambulatory Visit (HOSPITAL_COMMUNITY): Payer: Medicare HMO | Admitting: Critical Care Medicine

## 2021-07-17 ENCOUNTER — Encounter (HOSPITAL_COMMUNITY): Payer: Self-pay | Admitting: Orthopedic Surgery

## 2021-07-17 DIAGNOSIS — E109 Type 1 diabetes mellitus without complications: Secondary | ICD-10-CM | POA: Insufficient documentation

## 2021-07-17 DIAGNOSIS — M5412 Radiculopathy, cervical region: Secondary | ICD-10-CM | POA: Diagnosis not present

## 2021-07-17 DIAGNOSIS — Z419 Encounter for procedure for purposes other than remedying health state, unspecified: Secondary | ICD-10-CM

## 2021-07-17 DIAGNOSIS — Z7982 Long term (current) use of aspirin: Secondary | ICD-10-CM | POA: Insufficient documentation

## 2021-07-17 DIAGNOSIS — Z8546 Personal history of malignant neoplasm of prostate: Secondary | ICD-10-CM | POA: Insufficient documentation

## 2021-07-17 DIAGNOSIS — M4322 Fusion of spine, cervical region: Secondary | ICD-10-CM | POA: Diagnosis not present

## 2021-07-17 DIAGNOSIS — M5020 Other cervical disc displacement, unspecified cervical region: Secondary | ICD-10-CM | POA: Diagnosis present

## 2021-07-17 DIAGNOSIS — I25119 Atherosclerotic heart disease of native coronary artery with unspecified angina pectoris: Secondary | ICD-10-CM | POA: Diagnosis not present

## 2021-07-17 DIAGNOSIS — Z79899 Other long term (current) drug therapy: Secondary | ICD-10-CM | POA: Insufficient documentation

## 2021-07-17 DIAGNOSIS — I10 Essential (primary) hypertension: Secondary | ICD-10-CM | POA: Diagnosis not present

## 2021-07-17 DIAGNOSIS — I2510 Atherosclerotic heart disease of native coronary artery without angina pectoris: Secondary | ICD-10-CM | POA: Insufficient documentation

## 2021-07-17 DIAGNOSIS — E119 Type 2 diabetes mellitus without complications: Secondary | ICD-10-CM | POA: Diagnosis not present

## 2021-07-17 DIAGNOSIS — J45909 Unspecified asthma, uncomplicated: Secondary | ICD-10-CM | POA: Insufficient documentation

## 2021-07-17 DIAGNOSIS — M50222 Other cervical disc displacement at C5-C6 level: Secondary | ICD-10-CM | POA: Diagnosis not present

## 2021-07-17 DIAGNOSIS — Z794 Long term (current) use of insulin: Secondary | ICD-10-CM | POA: Insufficient documentation

## 2021-07-17 DIAGNOSIS — Z981 Arthrodesis status: Secondary | ICD-10-CM | POA: Diagnosis not present

## 2021-07-17 DIAGNOSIS — M50122 Cervical disc disorder at C5-C6 level with radiculopathy: Secondary | ICD-10-CM | POA: Diagnosis not present

## 2021-07-17 HISTORY — PX: CERVICAL DISC ARTHROPLASTY: SHX587

## 2021-07-17 LAB — GLUCOSE, CAPILLARY
Glucose-Capillary: 164 mg/dL — ABNORMAL HIGH (ref 70–99)
Glucose-Capillary: 121 mg/dL — ABNORMAL HIGH (ref 70–99)
Glucose-Capillary: 56 mg/dL — ABNORMAL LOW (ref 70–99)
Glucose-Capillary: 61 mg/dL — ABNORMAL LOW (ref 70–99)
Glucose-Capillary: 109 mg/dL — ABNORMAL HIGH (ref 70–99)
Glucose-Capillary: 111 mg/dL — ABNORMAL HIGH (ref 70–99)
Glucose-Capillary: 89 mg/dL (ref 70–99)
Glucose-Capillary: 100 mg/dL — ABNORMAL HIGH (ref 70–99)
Glucose-Capillary: 128 mg/dL — ABNORMAL HIGH (ref 70–99)
Glucose-Capillary: 129 mg/dL — ABNORMAL HIGH (ref 70–99)
Glucose-Capillary: 175 mg/dL — ABNORMAL HIGH (ref 70–99)
Glucose-Capillary: 354 mg/dL — ABNORMAL HIGH (ref 70–99)

## 2021-07-17 SURGERY — CERVICAL ANTERIOR DISC ARTHROPLASTY
Anesthesia: General

## 2021-07-17 MED ORDER — OXYCODONE HCL 5 MG/5ML PO SOLN: 5.0000 mg | Freq: Once | ORAL | Status: DC | PRN

## 2021-07-17 MED ORDER — ONDANSETRON HCL 4 MG PO TABS: 4.0000 mg | ORAL_TABLET | Freq: Four times a day (QID) | ORAL | Status: DC | PRN

## 2021-07-17 MED ORDER — POLYETHYLENE GLYCOL 3350 17 G PO PACK: 17.0000 g | PACK | Freq: Every day | ORAL | Status: DC | PRN

## 2021-07-17 MED ORDER — INSULIN PUMP
Freq: Three times a day (TID) | SUBCUTANEOUS | Status: DC
  Administered 2021-07-17 – 2021-07-18 (×3): 10 via SUBCUTANEOUS
  Filled 2021-07-17: qty 1

## 2021-07-17 MED ORDER — PROPOFOL 10 MG/ML IV BOLUS
INTRAVENOUS | Status: DC | PRN
Start: 2021-07-17 — End: 2021-07-17
  Administered 2021-07-17: 150 mg via INTRAVENOUS
  Administered 2021-07-17: 50 mg via INTRAVENOUS

## 2021-07-17 MED ORDER — THROMBIN 20000 UNITS EX KIT
PACK | CUTANEOUS | Status: AC
  Filled 2021-07-17: qty 1

## 2021-07-17 MED ORDER — FLEET ENEMA 7-19 GM/118ML RE ENEM: 1.0000 | ENEMA | Freq: Once | RECTAL | Status: DC | PRN

## 2021-07-17 MED ORDER — ONDANSETRON HCL 4 MG/2ML IJ SOLN: 4.0000 mg | Freq: Four times a day (QID) | INTRAMUSCULAR | Status: DC | PRN

## 2021-07-17 MED ORDER — THROMBIN 20000 UNITS EX SOLR
CUTANEOUS | Status: DC | PRN
  Administered 2021-07-17: 20000 [IU] via TOPICAL

## 2021-07-17 MED ORDER — ACETAMINOPHEN 650 MG RE SUPP: 650.0000 mg | RECTAL | Status: DC | PRN

## 2021-07-17 MED ORDER — HEMOSTATIC AGENTS (NO CHARGE) OPTIME
TOPICAL | Status: DC | PRN
  Administered 2021-07-17: 1 via TOPICAL

## 2021-07-17 MED ORDER — DEXTROSE 50 % IV SOLN
INTRAVENOUS | Status: DC | PRN
Start: 2021-07-17 — End: 2021-07-17
  Administered 2021-07-17: 25 mL via INTRAVENOUS

## 2021-07-17 MED ORDER — ONDANSETRON HCL 4 MG/2ML IJ SOLN
INTRAMUSCULAR | Status: DC | PRN
  Administered 2021-07-17: 4 mg via INTRAVENOUS

## 2021-07-17 MED ORDER — SODIUM CHLORIDE 0.9% FLUSH: 3.0000 mL | INTRAVENOUS | Status: DC | PRN

## 2021-07-17 MED ORDER — ACETAMINOPHEN 325 MG PO TABS
650.0000 mg | ORAL_TABLET | ORAL | Status: DC | PRN
  Administered 2021-07-18 (×2): 650 mg via ORAL
  Filled 2021-07-17 (×2): qty 2

## 2021-07-17 MED ORDER — FENTANYL CITRATE (PF) 250 MCG/5ML IJ SOLN
INTRAMUSCULAR | Status: DC | PRN
  Administered 2021-07-17 (×2): 50 ug via INTRAVENOUS
  Administered 2021-07-17: 100 ug via INTRAVENOUS
  Administered 2021-07-17: 50 ug via INTRAVENOUS

## 2021-07-17 MED ORDER — LIDOCAINE 2% (20 MG/ML) 5 ML SYRINGE
INTRAMUSCULAR | Status: AC
  Filled 2021-07-17: qty 10

## 2021-07-17 MED ORDER — LACTATED RINGERS IV SOLN: INTRAVENOUS | Status: DC

## 2021-07-17 MED ORDER — CEFAZOLIN SODIUM-DEXTROSE 1-4 GM/50ML-% IV SOLN
1.0000 g | Freq: Three times a day (TID) | INTRAVENOUS | Status: AC
  Administered 2021-07-17 – 2021-07-18 (×2): 1 g via INTRAVENOUS
  Filled 2021-07-17 (×2): qty 50

## 2021-07-17 MED ORDER — PHENYLEPHRINE 40 MCG/ML (10ML) SYRINGE FOR IV PUSH (FOR BLOOD PRESSURE SUPPORT)
PREFILLED_SYRINGE | INTRAVENOUS | Status: DC | PRN
  Administered 2021-07-17 (×3): 80 ug via INTRAVENOUS
  Administered 2021-07-17: 120 ug via INTRAVENOUS
  Administered 2021-07-17: 80 ug via INTRAVENOUS
  Administered 2021-07-17: 120 ug via INTRAVENOUS
  Administered 2021-07-17: 80 ug via INTRAVENOUS

## 2021-07-17 MED ORDER — PHENYLEPHRINE HCL-NACL 20-0.9 MG/250ML-% IV SOLN
INTRAVENOUS | Status: DC | PRN
  Administered 2021-07-17: 25 ug/min via INTRAVENOUS

## 2021-07-17 MED ORDER — ORAL CARE MOUTH RINSE: 15.0000 mL | Freq: Once | OROMUCOSAL | Status: AC

## 2021-07-17 MED ORDER — LACTATED RINGERS IV SOLN: INTRAVENOUS | Status: DC | PRN

## 2021-07-17 MED ORDER — OXYCODONE HCL 5 MG PO TABS: 5.0000 mg | ORAL_TABLET | Freq: Once | ORAL | Status: DC | PRN

## 2021-07-17 MED ORDER — DEXAMETHASONE SODIUM PHOSPHATE 10 MG/ML IJ SOLN
INTRAMUSCULAR | Status: DC | PRN
  Administered 2021-07-17: 4 mg via INTRAVENOUS

## 2021-07-17 MED ORDER — FENTANYL CITRATE (PF) 250 MCG/5ML IJ SOLN
INTRAMUSCULAR | Status: AC
  Filled 2021-07-17: qty 5

## 2021-07-17 MED ORDER — PHENOL 1.4 % MT LIQD: 1.0000 | OROMUCOSAL | Status: DC | PRN

## 2021-07-17 MED ORDER — PHENYLEPHRINE 40 MCG/ML (10ML) SYRINGE FOR IV PUSH (FOR BLOOD PRESSURE SUPPORT)
PREFILLED_SYRINGE | INTRAVENOUS | Status: AC
  Filled 2021-07-17: qty 30

## 2021-07-17 MED ORDER — OXYCODONE HCL 5 MG PO TABS: 5.0000 mg | ORAL_TABLET | ORAL | Status: DC | PRN

## 2021-07-17 MED ORDER — FENTANYL CITRATE (PF) 100 MCG/2ML IJ SOLN
INTRAMUSCULAR | Status: AC
  Filled 2021-07-17: qty 2

## 2021-07-17 MED ORDER — METHOCARBAMOL 1000 MG/10ML IJ SOLN
500.0000 mg | Freq: Four times a day (QID) | INTRAVENOUS | Status: DC | PRN
  Filled 2021-07-17: qty 5

## 2021-07-17 MED ORDER — DEXTROSE 50 % IV SOLN
25.0000 mL | Freq: Once | INTRAVENOUS | Status: AC
  Administered 2021-07-17: 25 mL via INTRAVENOUS

## 2021-07-17 MED ORDER — ROCURONIUM BROMIDE 10 MG/ML (PF) SYRINGE
PREFILLED_SYRINGE | INTRAVENOUS | Status: AC
  Filled 2021-07-17: qty 20

## 2021-07-17 MED ORDER — ROCURONIUM BROMIDE 10 MG/ML (PF) SYRINGE
PREFILLED_SYRINGE | INTRAVENOUS | Status: DC | PRN
  Administered 2021-07-17: 20 mg via INTRAVENOUS
  Administered 2021-07-17: 10 mg via INTRAVENOUS
  Administered 2021-07-17: 50 mg via INTRAVENOUS
  Administered 2021-07-17: 20 mg via INTRAVENOUS
  Administered 2021-07-17: 10 mg via INTRAVENOUS

## 2021-07-17 MED ORDER — HYDROMORPHONE HCL 1 MG/ML IJ SOLN: 1.0000 mg | INTRAMUSCULAR | Status: DC | PRN

## 2021-07-17 MED ORDER — 0.9 % SODIUM CHLORIDE (POUR BTL) OPTIME
TOPICAL | Status: DC | PRN
  Administered 2021-07-17: 1000 mL

## 2021-07-17 MED ORDER — OXYCODONE HCL 5 MG PO TABS
10.0000 mg | ORAL_TABLET | ORAL | Status: DC | PRN
  Administered 2021-07-17 – 2021-07-18 (×4): 10 mg via ORAL
  Filled 2021-07-17 (×4): qty 2

## 2021-07-17 MED ORDER — DEXAMETHASONE SODIUM PHOSPHATE 10 MG/ML IJ SOLN
INTRAMUSCULAR | Status: AC
  Filled 2021-07-17: qty 2

## 2021-07-17 MED ORDER — CEFAZOLIN SODIUM-DEXTROSE 2-4 GM/100ML-% IV SOLN
2.0000 g | INTRAVENOUS | Status: AC
  Administered 2021-07-17: 2 g via INTRAVENOUS
  Filled 2021-07-17: qty 100

## 2021-07-17 MED ORDER — MIDAZOLAM HCL 5 MG/5ML IJ SOLN
INTRAMUSCULAR | Status: DC | PRN
  Administered 2021-07-17: 2 mg via INTRAVENOUS

## 2021-07-17 MED ORDER — ONDANSETRON HCL 4 MG/2ML IJ SOLN
INTRAMUSCULAR | Status: AC
  Filled 2021-07-17: qty 2

## 2021-07-17 MED ORDER — SODIUM CHLORIDE 0.9 % IV SOLN: 250.0000 mL | INTRAVENOUS | Status: DC

## 2021-07-17 MED ORDER — SUGAMMADEX SODIUM 200 MG/2ML IV SOLN
INTRAVENOUS | Status: DC | PRN
  Administered 2021-07-17: 200 mg via INTRAVENOUS

## 2021-07-17 MED ORDER — PROPOFOL 10 MG/ML IV BOLUS
INTRAVENOUS | Status: AC
  Filled 2021-07-17: qty 20

## 2021-07-17 MED ORDER — DEXTROSE 50 % IV SOLN
INTRAVENOUS | Status: AC
  Filled 2021-07-17: qty 50

## 2021-07-17 MED ORDER — METHOCARBAMOL 500 MG PO TABS
500.0000 mg | ORAL_TABLET | Freq: Four times a day (QID) | ORAL | Status: DC | PRN
  Administered 2021-07-17 – 2021-07-18 (×2): 500 mg via ORAL
  Filled 2021-07-17 (×2): qty 1

## 2021-07-17 MED ORDER — SODIUM CHLORIDE 0.9% FLUSH: 3.0000 mL | Freq: Two times a day (BID) | INTRAVENOUS | Status: DC

## 2021-07-17 MED ORDER — FENTANYL CITRATE (PF) 100 MCG/2ML IJ SOLN
25.0000 ug | INTRAMUSCULAR | Status: DC | PRN
  Administered 2021-07-17 (×3): 25 ug via INTRAVENOUS

## 2021-07-17 MED ORDER — EPHEDRINE SULFATE-NACL 50-0.9 MG/10ML-% IV SOSY
PREFILLED_SYRINGE | INTRAVENOUS | Status: DC | PRN
  Administered 2021-07-17: 2.5 mg via INTRAVENOUS
  Administered 2021-07-17: 7.5 mg via INTRAVENOUS

## 2021-07-17 MED ORDER — EPHEDRINE 5 MG/ML INJ
INTRAVENOUS | Status: AC
  Filled 2021-07-17: qty 10

## 2021-07-17 MED ORDER — MIDAZOLAM HCL 2 MG/2ML IJ SOLN
INTRAMUSCULAR | Status: AC
  Filled 2021-07-17: qty 2

## 2021-07-17 MED ORDER — CHLORHEXIDINE GLUCONATE 0.12 % MT SOLN
15.0000 mL | Freq: Once | OROMUCOSAL | Status: AC
  Administered 2021-07-17: 15 mL via OROMUCOSAL
  Filled 2021-07-17: qty 15

## 2021-07-17 MED ORDER — BUPIVACAINE-EPINEPHRINE 0.25% -1:200000 IJ SOLN
INTRAMUSCULAR | Status: DC | PRN
  Administered 2021-07-17: 5 mL

## 2021-07-17 MED ORDER — TRANEXAMIC ACID 1000 MG/10ML IV SOLN
2000.0000 mg | Freq: Once | INTRAVENOUS | Status: DC
  Filled 2021-07-17: qty 20

## 2021-07-17 MED ORDER — LIDOCAINE 2% (20 MG/ML) 5 ML SYRINGE
INTRAMUSCULAR | Status: DC | PRN
Start: 2021-07-17 — End: 2021-07-17
  Administered 2021-07-17: 60 mg via INTRAVENOUS

## 2021-07-17 MED ORDER — MENTHOL 3 MG MT LOZG: 1.0000 | LOZENGE | OROMUCOSAL | Status: DC | PRN

## 2021-07-17 MED ORDER — SUCCINYLCHOLINE CHLORIDE 200 MG/10ML IV SOSY
PREFILLED_SYRINGE | INTRAVENOUS | Status: AC
  Filled 2021-07-17: qty 10

## 2021-07-17 MED ORDER — BUPIVACAINE-EPINEPHRINE (PF) 0.25% -1:200000 IJ SOLN
INTRAMUSCULAR | Status: AC
  Filled 2021-07-17: qty 30

## 2021-07-17 SURGICAL SUPPLY — 56 items
AGENT HMST KT MTR STRL THRMB (HEMOSTASIS) ×1
BAG COUNTER SPONGE SURGICOUNT (BAG) ×2 IMPLANT
BAG SPNG CNTER NS LX DISP (BAG) ×1
BLADE CLIPPER SURG (BLADE) IMPLANT
CANISTER SUCT 3000ML PPV (MISCELLANEOUS) ×2 IMPLANT
CLSR STERI-STRIP ANTIMIC 1/2X4 (GAUZE/BANDAGES/DRESSINGS) ×2 IMPLANT
COVER MAYO STAND STRL (DRAPES) ×4 IMPLANT
COVER SURGICAL LIGHT HANDLE (MISCELLANEOUS) ×4 IMPLANT
DRAIN CHANNEL 15F RND FF W/TCR (WOUND CARE) IMPLANT
DRAPE C-ARM 42X72 X-RAY (DRAPES) ×2 IMPLANT
DRAPE C-ARMOR (DRAPES) IMPLANT
DRAPE POUCH INSTRU U-SHP 10X18 (DRAPES) ×2 IMPLANT
DRAPE SURG 17X23 STRL (DRAPES) ×2 IMPLANT
DRAPE U-SHAPE 47X51 STRL (DRAPES) ×2 IMPLANT
DRSG OPSITE 4X5.5 SM (GAUZE/BANDAGES/DRESSINGS) ×1 IMPLANT
DRSG OPSITE POSTOP 3X4 (GAUZE/BANDAGES/DRESSINGS) ×2 IMPLANT
DURAPREP 26ML APPLICATOR (WOUND CARE) ×2 IMPLANT
ELECT COATED BLADE 2.86 ST (ELECTRODE) ×2 IMPLANT
ELECT PENCIL ROCKER SW 15FT (MISCELLANEOUS) ×2 IMPLANT
ELECT REM PT RETURN 9FT ADLT (ELECTROSURGICAL) ×2
ELECTRODE REM PT RTRN 9FT ADLT (ELECTROSURGICAL) ×1 IMPLANT
GLOVE SURG ENC MOIS LTX SZ6.5 (GLOVE) ×2 IMPLANT
GLOVE SURG MICRO LTX SZ8.5 (GLOVE) ×2 IMPLANT
GLOVE SURG UNDER POLY LF SZ6.5 (GLOVE) ×2 IMPLANT
GLOVE SURG UNDER POLY LF SZ8.5 (GLOVE) ×2 IMPLANT
GOWN STRL REUS W/ TWL LRG LVL3 (GOWN DISPOSABLE) ×2 IMPLANT
GOWN STRL REUS W/TWL 2XL LVL3 (GOWN DISPOSABLE) ×2 IMPLANT
GOWN STRL REUS W/TWL LRG LVL3 (GOWN DISPOSABLE) ×4
KIT BASIN OR (CUSTOM PROCEDURE TRAY) ×2 IMPLANT
KIT TURNOVER KIT B (KITS) ×2 IMPLANT
NDL SPNL 18GX3.5 QUINCKE PK (NEEDLE) ×1 IMPLANT
NEEDLE SPNL 18GX3.5 QUINCKE PK (NEEDLE) ×2 IMPLANT
NS IRRIG 1000ML POUR BTL (IV SOLUTION) ×2 IMPLANT
PACK ORTHO CERVICAL (CUSTOM PROCEDURE TRAY) ×2 IMPLANT
PACK UNIVERSAL I (CUSTOM PROCEDURE TRAY) ×2 IMPLANT
PAD ARMBOARD 7.5X6 YLW CONV (MISCELLANEOUS) ×4 IMPLANT
PIN DISTRACTION MAXCESS-C 14 (PIN) ×2 IMPLANT
POSITIONER HEAD DONUT 9IN (MISCELLANEOUS) ×2 IMPLANT
PROS DISC CERV SIMPLIFY MED H4 (Neuro Prosthesis/Implant) ×2 IMPLANT
PROSTHESIS DISC CERV SMP MD H4 (Neuro Prosthesis/Implant) IMPLANT
RESTRAINT LIMB HOLDER UNIV (RESTRAINTS) ×2 IMPLANT
SPONGE INTESTINAL PEANUT (DISPOSABLE) IMPLANT
SPONGE SURGIFOAM ABS GEL SZ50 (HEMOSTASIS) ×2 IMPLANT
SURGIFLO W/THROMBIN 8M KIT (HEMOSTASIS) ×2 IMPLANT
SUT BONE WAX W31G (SUTURE) ×2 IMPLANT
SUT MNCRL AB 3-0 PS2 27 (SUTURE) ×2 IMPLANT
SUT SILK 3 0 TIES 17X18 (SUTURE)
SUT SILK 3-0 18XBRD TIE BLK (SUTURE) IMPLANT
SUT VIC AB 2-0 CT1 18 (SUTURE) ×2 IMPLANT
SYR BULB IRRIG 60ML STRL (SYRINGE) ×2 IMPLANT
SYR CONTROL 10ML LL (SYRINGE) IMPLANT
TAPE CLOTH 4X10 WHT NS (GAUZE/BANDAGES/DRESSINGS) ×2 IMPLANT
TAPE UMBILICAL COTTON 1/8X30 (MISCELLANEOUS) ×2 IMPLANT
TOWEL GREEN STERILE (TOWEL DISPOSABLE) ×2 IMPLANT
TOWEL GREEN STERILE FF (TOWEL DISPOSABLE) ×2 IMPLANT
WATER STERILE IRR 1000ML POUR (IV SOLUTION) ×2 IMPLANT

## 2021-07-17 NOTE — H&P (Signed)
Addendum H&P: There has been no change in his clinical exam since his last office visit visit of 07/09/2021.  He continues to have significant neck and radicular right arm pain as well as weakness in the C6 dermatome.  Plan on moving forward with C5-6 total disc arthroplasty.  I have gone over risks, benefits, and alternatives to surgery with the patient and he is expressing understanding and willingness to move forward with surgery.

## 2021-07-17 NOTE — Op Note (Signed)
OPERATIVE REPORT  DATE OF SURGERY: 07/17/2021  PATIENT NAME:  Travis Fletcher. MRN: [id number] DOB: 05-May-1956  PCP: Kelton Pillar, MD  PRE-OPERATIVE DIAGNOSIS: Cervical disc herniation C5-6 with right C6 radiculopathy  POST-OPERATIVE DIAGNOSIS: Same  PROCEDURE:   Cervical total disc replacement C5-6  SURGEON:  Melina Schools, MD  PHYSICIAN ASSISTANT: Nelson Chimes, PA  ANESTHESIA:   General  EBL: 659 ml   Complications: None  Implants: NuVasive simplify cervical disc replacement 4 mm medium  BRIEF HISTORY: Travis Fletcher. is a 66 y.o. male who presented my office with significant right radicular arm pain and weakness secondary to a C5-6 disc herniation MRI confirmed a large posterior lateral to the right disc herniation causing compression of the nerve root.  As a result of the progressive neurological deficits and the failure to improve with conservative management we elected to move forward with surgery.  All appropriate risks, benefits, and alternatives to surgery were discussed with the patient and consent was obtained.  PROCEDURE DETAILS: Patient was brought into the operating room and was properly positioned on the operating room table.  After induction with general anesthesia the patient was endotracheally intubated.  A timeout was taken to confirm all important data: including patient, procedure, and the level. Teds, SCD's were applied.   The anterior cervical spine was prepped and draped in standard fashion.  Using fluoroscopy identified the C5-6 level and I marked out on the skin.  I infiltrated the area with quarter percent Marcaine with epinephrine and then made an incision starting in the midline and proceeding transversely to the left.  Sharp dissection was carried out down to and through the platysma.  I continued to perform a standard Smith-Robinson approach to the cervical spine.  I dissected along the medial border of the sternocleidomastoid  until I could identify the omohyoid muscle.  It was isolated and sacrificed to improve visualization.  I continued dissecting until I was through the deep cervical and prevertebral fascia and I could visualize the anterior cervical spine.  The trachea and esophagus were swept to the right and protected with a hand-held retractor and I used a Kitner dissectors to complete my dissection.  I then placed a needle into the C5-6 disc space took an x-ray and confirmed that I was at the appropriate level.  I then used the bipolar electrocautery to mobilize the longus coli muscle from the superior aspect of C5 to the inferior aspect of C6.  I then placed the retractor blades underneath the longus coli muscle deflated the endotracheal cuff and expanded the retractor to the appropriate width.  I then locked it in place.  The endotracheal cuff was then inflated.  An annulotomy was performed with a 15 blade scalpel and I remove the bulk of the disc material with pituitary rondure.  I then placed distraction pins using AP fluoroscopy guidance in the midline of the C5 and C6 vertebral body.  I then distracted the intervertebral space with a lamina spreader and maintain the distraction with the distraction pin set.  I continue using my curettes to remove all of the disc material until I could see the posterior annulus.  On the right side there was rupture into the posterior annulus consistent with the large disc herniation seen on the MRI.  Using a nerve hook I swept and delivered 2 large fragments of disc material from the right posterior lateral corner.  I then continued to remove the remainder of the posterior annulus so I  could better visualize posteriorly.  I then ultimately was able to develop a plane under the posterior longitudinal ligament and resect this with a 1 mm Kerrison.  At this point I can now freely pass my nerve hook underneath the C5 and C6 vertebral body bilaterally and under the uncovertebral joints.  I have  removed fragments of disc material consistent with what was seen on the MRI.  At this point I felt as though I had an adequate discectomy and decompression.  A trial implant was inserted and it was felt to be the correct size.  The osteotome was obtained and the 2 main cuts were made into the endplates of C5 and C6.  I then irrigated copiously normal saline and checked 1 last time of my nerve hook to ensure there was no fragments of disc material or bone that had been pushed back where that was not resected initially.  I can still freely pass the nerve hook underneath the vertebral body and under the uncovertebral joints and there is no additional disc fragments that I could appreciate.  The disc replacement was then obtained and malleted to the appropriate depth.  I confirmed satisfactory position in both the AP and lateral planes.  At this point I removed the distraction pins and sealed the bleeding bone edges with bone wax.  I then irrigated the wound copiously normal saline and made sure that hemostasis.  I then remove the remaining retractors and return the esophagus to midline.  I took final AP and lateral x-rays with all the retractors demonstrating satisfactory position of the implant in both planes.  The platysma was then reapproximated with interrupted 2-0 Vicryl suture, and the skin with a 3-0 Monocryl.  Steri-Strips and dry dressing as well as a soft collar were applied and the patient was ultimately extubated and transferred the PACU without incident.  The end of the case all needle sponge counts were correct.  There were no adverse intraoperative events.  Melina Schools, MD 07/17/2021 4:24 PM

## 2021-07-17 NOTE — Progress Notes (Signed)
Patient self administered 8 units of Novolog via insulin pump at 1020 before arrival. Patient's blood sugar was checked upon arrival at 1115. Spoke with Dr. Marcie Bal and was told to recheck blood sugar 2 hours from the last sugar check. No insulin was administered at this time.

## 2021-07-17 NOTE — Transfer of Care (Signed)
Immediate Anesthesia Transfer of Care Note  Patient: Travis Fletcher.  Procedure(s) Performed: CERVICAL ANTERIOR DISC ARTHROPLASTY C5-6  Patient Location: PACU  Anesthesia Type:General  Level of Consciousness: drowsy and patient cooperative  Airway & Oxygen Therapy: Patient Spontanous Breathing and Patient connected to nasal cannula oxygen  Post-op Assessment: Report given to RN, Post -op Vital signs reviewed and stable and Patient moving all extremities  Post vital signs: Reviewed and stable  Last Vitals:  Vitals Value Taken Time  BP 145/70 07/17/21 1642  Temp    Pulse 96 07/17/21 1644  Resp 16 07/17/21 1644  SpO2 99 % 07/17/21 1644  Vitals shown include unvalidated device data.  Last Pain:  Vitals:   07/17/21 1132  TempSrc:   PainSc: 2       Patients Stated Pain Goal: 2 (01/59/96 8957)  Complications: No notable events documented.

## 2021-07-17 NOTE — Progress Notes (Signed)
Inpatient Diabetes Program Recommendations  AACE/ADA: New Consensus Statement on Inpatient Glycemic Control (2015)  Target Ranges:  Prepandial:   less than 140 mg/dL      Peak postprandial:   less than 180 mg/dL (1-2 hours)      Critically ill patients:  140 - 180 mg/dL   Lab Results  Component Value Date   GLUCAP 111 (H) 07/17/2021   HGBA1C 6.9 (H) 07/15/2021    Review of Glycemic Control  Latest Reference Range & Units 07/17/21 11:14 07/17/21 12:36 07/17/21 12:53 07/17/21 13:29 07/17/21 13:45 07/17/21 14:16  Glucose-Capillary 70 - 99 mg/dL 164 (H) 56 (L) 121 (H) 61 (L) 109 (H) 111 (H)   Diabetes history: DM 1 Outpatient Diabetes medications: Insulin pump with Novolog Current orders for Inpatient glycemic control:  Insulin pump  Inpatient Diabetes Program Recommendations:    Per documentation, patient is wearing insulin pump.  Blood sugars being monitored closely.  He see's Dr. Buddy Duty- endocrinologist.  Will need insulin pump order set placed once he is transferred to floor.   Thanks,  Adah Perl, RN, BC-ADM Inpatient Diabetes Coordinator Pager [phone]  (8a-5p)

## 2021-07-17 NOTE — Discharge Instructions (Signed)

## 2021-07-17 NOTE — Anesthesia Procedure Notes (Signed)
Procedure Name: Intubation Date/Time: 07/17/2021 1:50 PM Performed by: Wilburn Cornelia, CRNA Pre-anesthesia Checklist: Patient identified, Emergency Drugs available, Suction available, Patient being monitored and Timeout performed Patient Re-evaluated:Patient Re-evaluated prior to induction Oxygen Delivery Method: Circle system utilized Preoxygenation: Pre-oxygenation with 100% oxygen Induction Type: IV induction Ventilation: Mask ventilation without difficulty Grade View: Grade I Tube type: Oral Tube size: 7.5 mm Number of attempts: 1 Airway Equipment and Method: Video-laryngoscopy and Rigid stylet Placement Confirmation: ETT inserted through vocal cords under direct vision, positive ETCO2, CO2 detector and breath sounds checked- equal and bilateral Secured at: 23 cm Tube secured with: Tape Dental Injury: Teeth and Oropharynx as per pre-operative assessment

## 2021-07-17 NOTE — Brief Op Note (Signed)
07/17/2021  4:33 PM  PATIENT:  Travis Fletcher.  66 y.o. male  PRE-OPERATIVE DIAGNOSIS:  Cervical Disc hernation with radiculopathy C5-6  POST-OPERATIVE DIAGNOSIS:  Cervical Disc hernation with radiculopathy C5-6  PROCEDURE:  Procedure(s) with comments: CERVICAL ANTERIOR DISC ARTHROPLASTY C5-6 (N/A) - 3 hrs 3 C-Bed  SURGEON:  Surgeon(s) and Role:    Melina Schools, MD - Primary  PHYSICIAN ASSISTANT:   ASSISTANTS: Nelson Chimes, PA   ANESTHESIA:   general  EBL:  100 mL   BLOOD ADMINISTERED:none  DRAINS: none   LOCAL MEDICATIONS USED:  MARCAINE     SPECIMEN:  No Specimen  DISPOSITION OF SPECIMEN:  N/A  COUNTS:  YES  TOURNIQUET:  * No tourniquets in log *  DICTATION: .Dragon Dictation  PLAN OF CARE: Admit for overnight observation  PATIENT DISPOSITION:  PACU - hemodynamically stable.

## 2021-07-18 ENCOUNTER — Encounter (HOSPITAL_COMMUNITY): Payer: Self-pay | Admitting: Orthopedic Surgery

## 2021-07-18 ENCOUNTER — Other Ambulatory Visit: Payer: Self-pay

## 2021-07-18 DIAGNOSIS — Z794 Long term (current) use of insulin: Secondary | ICD-10-CM | POA: Diagnosis not present

## 2021-07-18 DIAGNOSIS — J45909 Unspecified asthma, uncomplicated: Secondary | ICD-10-CM | POA: Diagnosis not present

## 2021-07-18 DIAGNOSIS — Z7982 Long term (current) use of aspirin: Secondary | ICD-10-CM | POA: Diagnosis not present

## 2021-07-18 DIAGNOSIS — Z8546 Personal history of malignant neoplasm of prostate: Secondary | ICD-10-CM | POA: Diagnosis not present

## 2021-07-18 DIAGNOSIS — Z79899 Other long term (current) drug therapy: Secondary | ICD-10-CM | POA: Diagnosis not present

## 2021-07-18 DIAGNOSIS — M5412 Radiculopathy, cervical region: Secondary | ICD-10-CM | POA: Diagnosis not present

## 2021-07-18 DIAGNOSIS — M50222 Other cervical disc displacement at C5-C6 level: Secondary | ICD-10-CM | POA: Diagnosis not present

## 2021-07-18 DIAGNOSIS — E109 Type 1 diabetes mellitus without complications: Secondary | ICD-10-CM | POA: Diagnosis not present

## 2021-07-18 DIAGNOSIS — I2510 Atherosclerotic heart disease of native coronary artery without angina pectoris: Secondary | ICD-10-CM | POA: Diagnosis not present

## 2021-07-18 LAB — GLUCOSE, CAPILLARY
Glucose-Capillary: 338 mg/dL — ABNORMAL HIGH (ref 70–99)
Glucose-Capillary: 299 mg/dL — ABNORMAL HIGH (ref 70–99)

## 2021-07-18 MED ORDER — METHOCARBAMOL 500 MG PO TABS: 500.0000 mg | ORAL_TABLET | Freq: Three times a day (TID) | ORAL | 0 refills | Status: AC | PRN

## 2021-07-18 MED ORDER — ONDANSETRON HCL 4 MG PO TABS: 4.0000 mg | ORAL_TABLET | Freq: Three times a day (TID) | ORAL | 0 refills | Status: DC | PRN

## 2021-07-18 MED ORDER — OXYCODONE-ACETAMINOPHEN 10-325 MG PO TABS: 1.0000 | ORAL_TABLET | Freq: Four times a day (QID) | ORAL | 0 refills | Status: AC | PRN

## 2021-07-18 MED FILL — Thrombin For Soln Kit 20000 Unit: CUTANEOUS | Qty: 1 | Status: AC

## 2021-07-18 NOTE — Progress Notes (Signed)
° ° °  Subjective: Procedure(s) (LRB): CERVICAL ANTERIOR DISC ARTHROPLASTY C5-6 (N/A) 1 Day Post-Op  Patient reports pain as 1 on 0-10 scale.  Reports decreased arm pain reports incisional neck pain   Positive void Positive bowel movement Positive flatus Negative chest pain or shortness of breath  Objective: Vital signs in last 24 hours: Temp:  [97.2 F (36.2 C)-98.9 F (37.2 C)] 98.3 F (36.8 C) (02/09 0727) Pulse Rate:  [79-100] 98 (02/09 0727) Resp:  [10-20] 17 (02/09 0727) BP: (116-158)/(58-73) 123/58 (02/09 0727) SpO2:  [94 %-99 %] 97 % (02/09 0727) Weight:  [90.7 kg] 90.7 kg (02/08 1112)  Intake/Output from previous day: 02/08 0701 - 02/09 0700 In: 1150 [I.V.:1150] Out: 100 [Blood:100]  Labs: No results for input(s): WBC, RBC, HCT, PLT in the last 72 hours. No results for input(s): NA, K, CL, CO2, BUN, CREATININE, GLUCOSE, CALCIUM in the last 72 hours. No results for input(s): LABPT, INR in the last 72 hours.  Physical Exam: Neurologically intact ABD soft Intact pulses distally Incision: dressing C/D/I Compartment soft Body mass index is 29.53 kg/m.  Assessment/Plan: Patient stable  xrays n/a Mobilization with physical therapy Encourage incentive spirometry Continue care  Patient is doing well overall.  No active dysphagia.  Tolerating regular diet. Plan is to discharge to home and follow-up with me in 2 weeks   Melina Schools, MD [facility] [phone]

## 2021-07-18 NOTE — Evaluation (Signed)
Occupational Therapy Evaluation Patient Details Name: Travis Fletcher. MRN: [id number] DOB: 06/16/1955 Today's Date: 07/18/2021   History of Present Illness Pt is a 66 y/o male who presents s/p C5-C6 ACDF on 07/17/2021. PMH significant for CAD, spontaneous PTX, neuropathy, prostate CA, type 1 DM, hernia repair 1987.   Clinical Impression   Patient admitted for the procedure above.  He has a good understanding of cervical collar use, and all precautions.  He is essentially at his baseline for all mobility and ADL completion at a sit/stand level.  No further acute OT needs, all questions answered.        Recommendations for follow up therapy are one component of a multi-disciplinary discharge planning process, led by the attending physician.  Recommendations may be updated based on patient status, additional functional criteria and insurance authorization.   Follow Up Recommendations  No OT follow up    Assistance Recommended at Discharge Set up Supervision/Assistance  Patient can return home with the following      Functional Status Assessment  Patient has not had a recent decline in their functional status  Equipment Recommendations  None recommended by OT    Recommendations for Other Services       Precautions / Restrictions Precautions Precautions: Fall;Cervical Precaution Booklet Issued: Yes (comment) Precaution Comments: Reviewed handout and pt was cued for precautions during functional mobility. Required Braces or Orthoses: Cervical Brace Cervical Brace: Soft collar;For comfort Restrictions Weight Bearing Restrictions: No      Mobility Bed Mobility Overal bed mobility: Modified Independent                  Transfers Overall transfer level: Independent                        Balance Overall balance assessment: Mild deficits observed, not formally tested                                         ADL either performed or  assessed with clinical judgement   ADL Overall ADL's : At baseline                                       General ADL Comments: patient needs Assist with lacing shoes     Vision Patient Visual Report: No change from baseline       Perception Perception Perception: Not tested   Praxis Praxis Praxis: Not tested    Pertinent Vitals/Pain Pain Assessment Faces Pain Scale: Hurts a little bit Pain Location: Incision site Pain Descriptors / Indicators: Operative site guarding, Sore Pain Intervention(s): Monitored during session     Hand Dominance Right   Extremity/Trunk Assessment Upper Extremity Assessment Upper Extremity Assessment: Overall WFL for tasks assessed   Lower Extremity Assessment Lower Extremity Assessment: Defer to PT evaluation   Cervical / Trunk Assessment Cervical / Trunk Assessment: Neck Surgery   Communication Communication Communication: No difficulties   Cognition Arousal/Alertness: Awake/alert Behavior During Therapy: WFL for tasks assessed/performed Overall Cognitive Status: Within Functional Limits for tasks assessed                                       General Comments  VSS on RA    Exercises     Shoulder Instructions      Home Living Family/patient expects to be discharged to:: Private residence Living Arrangements: Spouse/significant other Available Help at Discharge: Family;Available 24 hours/day Type of Home: House Home Access: Stairs to enter [employer] of Steps: 3 Entrance Stairs-Rails: None Home Layout: One level     Bathroom Shower/Tub: Tub/shower unit;Walk-in shower   Bathroom Toilet: Standard     Home Equipment: Hand held shower head;Adaptive equipment Adaptive Equipment: Reacher        Prior Functioning/Environment Prior Level of Function : Independent/Modified Independent                        OT Problem List: Pain      OT Treatment/Interventions:       OT Goals(Current goals can be found in the care plan section) Acute Rehab OT Goals Patient Stated Goal: return home OT Goal Formulation: With patient Potential to Achieve Goals: Good  OT Frequency:      Co-evaluation              AM-PAC OT "6 Clicks" Daily Activity     Outcome Measure Help from another person eating meals?: None Help from another person taking care of personal grooming?: None Help from another person toileting, which includes using toliet, bedpan, or urinal?: None Help from another person bathing (including washing, rinsing, drying)?: None Help from another person to put on and taking off regular upper body clothing?: None Help from another person to put on and taking off regular lower body clothing?: A Little 6 Click Score: 23   End of Session Nurse Communication: Mobility status  Activity Tolerance: Patient tolerated treatment well Patient left: in chair;with call bell/phone within reach  OT Visit Diagnosis: Pain                Time: 4128-7867 OT Time Calculation (min): 21 min Charges:  OT General Charges $OT Visit: 1 Visit OT Evaluation $OT Eval Moderate Complexity: 1 Mod  07/18/2021  RP, OTR/L  Acute Rehabilitation Services  Office:  [phone]   Metta Clines 07/18/2021, 9:52 AM

## 2021-07-18 NOTE — Evaluation (Addendum)
Physical Therapy Evaluation and Discharge Patient Details Name: Travis Fletcher. MRN: [id number] DOB: May 28, 1956 Today's Date: 07/18/2021  History of Present Illness  Pt is a 66 y/o male who presents s/p C5-C6 ACDF on 07/17/2021. PMH significant for CAD, spontaneous PTX, neuropathy, prostate CA, type 1 DM, hernia repair 1987.   Clinical Impression  Patient evaluated by Physical Therapy with no further acute PT needs identified. All education has been completed and the patient has no further questions. Pt was able to demonstrate transfers and ambulation with gross modified independence and no AD. Pt was educated on precautions, brace application/wearing schedule, appropriate activity progression, and car transfer. See below for any follow-up Physical Therapy or equipment needs. PT is signing off. Thank you for this referral.        Recommendations for follow up therapy are one component of a multi-disciplinary discharge planning process, led by the attending physician.  Recommendations may be updated based on patient status, additional functional criteria and insurance authorization.  Follow Up Recommendations No PT follow up    Assistance Recommended at Discharge PRN  Patient can return home with the following  Help with stairs or ramp for entrance;Assist for transportation    Equipment Recommendations None recommended by PT  Recommendations for Other Services       Functional Status Assessment Patient has had a recent decline in their functional status and demonstrates the ability to make significant improvements in function in a reasonable and predictable amount of time.     Precautions / Restrictions Precautions Precautions: Fall;Cervical Precaution Booklet Issued: Yes (comment) Precaution Comments: Reviewed handout and pt was cued for precautions during functional mobility. Required Braces or Orthoses: Cervical Brace Cervical Brace: Soft collar;For  comfort Restrictions Weight Bearing Restrictions: No      Mobility  Bed Mobility Overal bed mobility: Modified Independent Bed Mobility: Rolling, Sidelying to Sit           General bed mobility comments: VC's for optimal log roll technique. No assist required. HOB slightly elevated like wedge pillow and rails lowered to simulate home environment.    Transfers Overall transfer level: Modified independent Equipment used: None               General transfer comment: Pt demonstrated good posture with power up to full stand. No unsteadiness or LOB noted.    Ambulation/Gait Ambulation/Gait assistance: Modified independent (Device/Increase time) Gait Distance (Feet): 250 Feet Assistive device: None Gait Pattern/deviations: Step-through pattern, Decreased stride length, Trunk flexed Gait velocity: Decreased Gait velocity interpretation: <1.31 ft/sec, indicative of household ambulator   General Gait Details: Pt ambulating with overall good posture and no unsteadiness/LOB noted.  Stairs Stairs: Yes Stairs assistance: Supervision Stair Management: No rails, Alternating pattern, Forwards Number of Stairs: 3 General stair comments: No unsteadiness or LOB noted with negotiation of 3 stairs. Wife present for education on guarding.  Wheelchair Mobility    Modified Rankin (Stroke Patients Only)       Balance Overall balance assessment: Mild deficits observed, not formally tested                                           Pertinent Vitals/Pain Pain Assessment Pain Assessment: Faces Faces Pain Scale: Hurts a little bit Pain Location: Incision site Pain Descriptors / Indicators: Operative site guarding, Sore Pain Intervention(s): Limited activity within patient's tolerance, Monitored during session, Repositioned  Home Living Family/patient expects to be discharged to:: Private residence Living Arrangements: Spouse/significant other Available Help  at Discharge: Family;Available 24 hours/day Type of Home: House Home Access: Stairs to enter Entrance Stairs-Rails: None Entrance Stairs-Number of Steps: 3   Home Layout: One level Home Equipment: Hand held shower head;Adaptive equipment      Prior Function Prior Level of Function : Independent/Modified Independent                     Hand Dominance   Dominant Hand: Right    Extremity/Trunk Assessment   Upper Extremity Assessment Upper Extremity Assessment: Overall WFL for tasks assessed    Lower Extremity Assessment Lower Extremity Assessment: Defer to PT evaluation    Cervical / Trunk Assessment Cervical / Trunk Assessment: Neck Surgery  Communication   Communication: No difficulties  Cognition Arousal/Alertness: Awake/alert Behavior During Therapy: WFL for tasks assessed/performed Overall Cognitive Status: Within Functional Limits for tasks assessed                                          General Comments      Exercises     Assessment/Plan    PT Assessment Patient does not need any further PT services  PT Problem List         PT Treatment Interventions      PT Goals (Current goals can be found in the Care Plan section)  Acute Rehab PT Goals Patient Stated Goal: Home today PT Goal Formulation: All assessment and education complete, DC therapy    Frequency       Co-evaluation               AM-PAC PT "6 Clicks" Mobility  Outcome Measure Help needed turning from your back to your side while in a flat bed without using bedrails?: None Help needed moving from lying on your back to sitting on the side of a flat bed without using bedrails?: None Help needed moving to and from a bed to a chair (including a wheelchair)?: None Help needed standing up from a chair using your arms (e.g., wheelchair or bedside chair)?: None Help needed to walk in hospital room?: None Help needed climbing 3-5 steps with a railing? : A Little 6  Click Score: 23    End of Session Equipment Utilized During Treatment: Gait belt;Cervical collar Activity Tolerance: Patient tolerated treatment well Patient left: in bed;with call bell/phone within reach;with family/visitor present Nurse Communication: Mobility status PT Visit Diagnosis: Unsteadiness on feet (R26.81);Pain Pain - part of body:  (neck)    Time: 6720-9470 PT Time Calculation (min) (ACUTE ONLY): 21 min   Charges:   PT Evaluation $PT Eval Low Complexity: 1 Low          Rolinda Roan, PT, DPT Acute Rehabilitation Services Pager: [phone] Office: [phone]   Thelma Comp 07/18/2021, 9:51 AM

## 2021-07-18 NOTE — Anesthesia Postprocedure Evaluation (Signed)
Anesthesia Post Note  Patient: Travis Fletcher.  Procedure(s) Performed: CERVICAL ANTERIOR DISC ARTHROPLASTY C5-6     Patient location during evaluation: PACU Anesthesia Type: General Level of consciousness: awake and alert Pain management: pain level controlled Vital Signs Assessment: post-procedure vital signs reviewed and stable Respiratory status: spontaneous breathing, nonlabored ventilation, respiratory function stable and patient connected to nasal cannula oxygen Cardiovascular status: blood pressure returned to baseline and stable Postop Assessment: no apparent nausea or vomiting Anesthetic complications: no   No notable events documented.  Last Vitals:  Vitals:   07/18/21 0450 07/18/21 0727  BP: 126/66 (!) 123/58  Pulse: 100 98  Resp: 20 17  Temp: 36.9 C 36.8 C  SpO2: 96% 97%    Last Pain:  Vitals:   07/18/21 1139  TempSrc:   PainSc: [place]

## 2021-07-26 NOTE — Discharge Summary (Signed)
Patient ID: Travis Fletcher. MRN: [id number] DOB/AGE: [birth year] 66 y.o.  Admit date: 07/17/2021 Discharge date: 07/18/2021  Admission Diagnoses:  Principal Problem:   Cervical disc herniation   Discharge Diagnoses:  Principal Problem:   Cervical disc herniation  status post Procedure(s): CERVICAL ANTERIOR DISC ARTHROPLASTY C5-6  Past Medical History:  Diagnosis Date   Anginal pain ([place])    Arthritis    "hands" (07/11/2014)   Asthma    mild   CAD (coronary artery disease), native coronary artery 07/24/2018   Coronary artery disease    H/O pneumothorax    spontaneous, when patient was 66 years old   High cholesterol    History of COVID-19 06/2019   x 2; 02/2021   History of kidney stones    Hypercholesteremia 07/24/2018   Kidney stones    "passed them all"   Neuromuscular disorder ([place])    neuropathy   Prostate cancer ([place]) 2010   Statin intolerance 07/24/2018   Type 1 diabetes mellitus with other specified complication [facility])     Surgeries: Procedure(s): CERVICAL ANTERIOR DISC ARTHROPLASTY C5-6 on 07/17/2021   Consultants:   Discharged Condition: Improved  Hospital Course: Travis Fletcher. is an 66 y.o. male who was admitted 07/17/2021 for operative treatment of Cervical disc herniation. Patient failed conservative treatments (please see the history and physical for the specifics) and had severe unremitting pain that affects sleep, daily activities and work/hobbies. After pre-op clearance, the patient was taken to the operating room on 07/17/2021 and underwent  Procedure(s): CERVICAL ANTERIOR DISC ARTHROPLASTY C5-6.    Patient was given perioperative antibiotics:  Anti-infectives (From admission, onward)    Start     Dose/Rate Route Frequency Ordered Stop   07/17/21 2000  ceFAZolin (ANCEF) IVPB 1 g/50 mL premix        1 g 100 mL/hr over 30 Minutes Intravenous Every 8 hours 07/17/21 1814 07/18/21 0430   07/17/21 1215  ceFAZolin (ANCEF) IVPB  2g/100 mL premix        2 g 200 mL/hr over 30 Minutes Intravenous 30 min pre-op 07/17/21 1111 07/17/21 1356        Patient was given sequential compression devices and early ambulation to prevent DVT.   Patient benefited maximally from hospital stay and there were no complications. At the time of discharge, the patient was urinating/moving their bowels without difficulty, tolerating a regular diet, pain is controlled with oral pain medications and they have been cleared by PT/OT.   Recent vital signs: No data found.   Recent laboratory studies: No results for input(s): WBC, HGB, HCT, PLT, NA, K, CL, CO2, BUN, CREATININE, GLUCOSE, INR, CALCIUM in the last 72 hours.  Invalid input(s): PT, 2   Discharge Medications:   Allergies as of 07/18/2021       Reactions   Brilinta [ticagrelor] Shortness Of Breath   Statins    Muscle pain, fatigue         Medication List     STOP taking these medications    CENTRUM SILVER 50+MEN PO   HYDROcodone-acetaminophen 10-325 MG tablet Commonly known as: NORCO   naproxen sodium 220 MG tablet Commonly known as: ALEVE       TAKE these medications    acetaminophen 325 MG tablet Commonly known as: TYLENOL Take 650 mg by mouth every 6 (six) hours as needed for moderate pain or headache.   albuterol 108 (90 Base) MCG/ACT inhaler Commonly known as: VENTOLIN HFA Inhale 1 puff into the lungs  every 6 (six) hours as needed for wheezing or shortness of breath.   aspirin 81 MG tablet Take 1 tablet (81 mg total) by mouth daily.   guaiFENesin 600 MG 12 hr tablet Commonly known as: MUCINEX Take 600 mg by mouth 2 (two) times daily as needed (congestion).   insulin aspart 100 UNIT/ML injection Commonly known as: novoLOG Inject into the skin See admin instructions. Uses via Insulin Pump   losartan 25 MG tablet Commonly known as: COZAAR TAKE HALF A TABLET BY MOUTH DAILY   Melatonin 5 MG Caps Take 5 mg by mouth at bedtime.   metoprolol  tartrate 25 MG tablet Commonly known as: LOPRESSOR TAKE 1/2 TABLET BY MOUTH TWICE A DAY AS NEEDED What changed:  how much to take how to take this when to take this additional instructions   nitroGLYCERIN 0.4 MG SL tablet Commonly known as: Nitrostat Place 1 tablet (0.4 mg total) under the tongue every 5 (five) minutes as needed for chest pain.   ondansetron 4 MG tablet Commonly known as: Zofran Take 1 tablet (4 mg total) by mouth every 8 (eight) hours as needed for nausea or vomiting.   Praluent 150 MG/ML Soaj Generic drug: Alirocumab Inject 150 mg into the skin every 14 (fourteen) days.   ranolazine 500 MG 12 hr tablet Commonly known as: RANEXA Take 1 tablet (500 mg total) by mouth 2 (two) times daily. What changed: when to take this   Repatha SureClick 161 MG/ML Soaj Generic drug: Evolocumab Inject 1 application into the skin every 14 (fourteen) days.   SYSTANE OP Place 1 drop into both eyes daily as needed (dry eyes).       ASK your doctor about these medications    methocarbamol 500 MG tablet Commonly known as: Robaxin Take 1 tablet (500 mg total) by mouth every 8 (eight) hours as needed for up to 5 days for muscle spasms. Ask about: Should I take this medication?   oxyCODONE-acetaminophen 10-325 MG tablet Commonly known as: Percocet Take 1 tablet by mouth every 6 (six) hours as needed for up to 5 days for pain. Ask about: Should I take this medication?        Diagnostic Studies: DG Cervical Spine 2 or 3 views  Result Date: 07/18/2021 CLINICAL DATA:  Fluoroscopic assistance for cervical spinal fusion EXAM: CERVICAL SPINE - 2-3 VIEW COMPARISON:  None. FINDINGS: Fluoroscopic images show placement of intervertebral disc spacer at C5-C6 level. Fluoroscopic time was 125 seconds. Radiation dose is 23.33 mGy. IMPRESSION: Fluoroscopic assistance was provided for anterior surgical fusion at C5-C6 level. Electronically Signed   By: Elmer Picker M.D.   On:  07/18/2021 08:04   DG C-Arm 1-60 Min-No Report  Result Date: 07/17/2021 Fluoroscopy was utilized by the requesting physician.  No radiographic interpretation.   DG C-Arm 1-60 Min-No Report  Result Date: 07/17/2021 Fluoroscopy was utilized by the requesting physician.  No radiographic interpretation.   DG C-Arm 1-60 Min-No Report  Result Date: 07/17/2021 Fluoroscopy was utilized by the requesting physician.  No radiographic interpretation.    Discharge Instructions     Incentive spirometry RT   Complete by: As directed         Follow-up Information     Melina Schools, MD. Schedule an appointment as soon as possible for a visit in 2 day(s).   Specialty: Orthopedic Surgery Why: If symptoms worsen, For suture removal, For wound re-check Contact information: [address]  [place] [address] [phone]  Discharge Plan:  discharge to home  Disposition: stable    Signed: Charlyne Petrin for [facility] PA-C [facility] [phone] 07/26/2021, 9:39 AM

## 2021-08-07 DIAGNOSIS — E103592 Type 1 diabetes mellitus with proliferative diabetic retinopathy without macular edema, left eye: Secondary | ICD-10-CM | POA: Diagnosis not present

## 2021-08-07 DIAGNOSIS — Z87442 Personal history of urinary calculi: Secondary | ICD-10-CM | POA: Diagnosis not present

## 2021-08-07 DIAGNOSIS — E1065 Type 1 diabetes mellitus with hyperglycemia: Secondary | ICD-10-CM | POA: Diagnosis not present

## 2021-08-07 DIAGNOSIS — Z794 Long term (current) use of insulin: Secondary | ICD-10-CM | POA: Diagnosis not present

## 2021-08-07 DIAGNOSIS — E103291 Type 1 diabetes mellitus with mild nonproliferative diabetic retinopathy without macular edema, right eye: Secondary | ICD-10-CM | POA: Diagnosis not present

## 2021-08-27 DIAGNOSIS — Z4889 Encounter for other specified surgical aftercare: Secondary | ICD-10-CM | POA: Diagnosis not present

## 2021-08-28 DIAGNOSIS — M2560 Stiffness of unspecified joint, not elsewhere classified: Secondary | ICD-10-CM | POA: Diagnosis not present

## 2021-08-28 DIAGNOSIS — M542 Cervicalgia: Secondary | ICD-10-CM | POA: Diagnosis not present

## 2021-09-04 DIAGNOSIS — M2560 Stiffness of unspecified joint, not elsewhere classified: Secondary | ICD-10-CM | POA: Diagnosis not present

## 2021-09-04 DIAGNOSIS — M542 Cervicalgia: Secondary | ICD-10-CM | POA: Diagnosis not present

## 2021-09-06 ENCOUNTER — Other Ambulatory Visit: Payer: Self-pay | Admitting: Cardiology

## 2021-09-11 DIAGNOSIS — M2560 Stiffness of unspecified joint, not elsewhere classified: Secondary | ICD-10-CM | POA: Diagnosis not present

## 2021-09-11 DIAGNOSIS — M542 Cervicalgia: Secondary | ICD-10-CM | POA: Diagnosis not present

## 2021-09-18 DIAGNOSIS — M542 Cervicalgia: Secondary | ICD-10-CM | POA: Diagnosis not present

## 2021-09-18 DIAGNOSIS — M2560 Stiffness of unspecified joint, not elsewhere classified: Secondary | ICD-10-CM | POA: Diagnosis not present

## 2021-09-25 DIAGNOSIS — M2560 Stiffness of unspecified joint, not elsewhere classified: Secondary | ICD-10-CM | POA: Diagnosis not present

## 2021-09-25 DIAGNOSIS — M542 Cervicalgia: Secondary | ICD-10-CM | POA: Diagnosis not present

## 2021-10-02 DIAGNOSIS — M542 Cervicalgia: Secondary | ICD-10-CM | POA: Diagnosis not present

## 2021-10-02 DIAGNOSIS — M2560 Stiffness of unspecified joint, not elsewhere classified: Secondary | ICD-10-CM | POA: Diagnosis not present

## 2021-10-09 DIAGNOSIS — M2560 Stiffness of unspecified joint, not elsewhere classified: Secondary | ICD-10-CM | POA: Diagnosis not present

## 2021-10-09 DIAGNOSIS — M542 Cervicalgia: Secondary | ICD-10-CM | POA: Diagnosis not present

## 2021-10-16 DIAGNOSIS — M2560 Stiffness of unspecified joint, not elsewhere classified: Secondary | ICD-10-CM | POA: Diagnosis not present

## 2021-10-16 DIAGNOSIS — M542 Cervicalgia: Secondary | ICD-10-CM | POA: Diagnosis not present

## 2021-11-08 DIAGNOSIS — Z794 Long term (current) use of insulin: Secondary | ICD-10-CM | POA: Diagnosis not present

## 2021-11-08 DIAGNOSIS — E103291 Type 1 diabetes mellitus with mild nonproliferative diabetic retinopathy without macular edema, right eye: Secondary | ICD-10-CM | POA: Diagnosis not present

## 2021-11-08 DIAGNOSIS — E1065 Type 1 diabetes mellitus with hyperglycemia: Secondary | ICD-10-CM | POA: Diagnosis not present

## 2021-11-08 DIAGNOSIS — E103592 Type 1 diabetes mellitus with proliferative diabetic retinopathy without macular edema, left eye: Secondary | ICD-10-CM | POA: Diagnosis not present

## 2021-11-08 DIAGNOSIS — Z87442 Personal history of urinary calculi: Secondary | ICD-10-CM | POA: Diagnosis not present

## 2021-12-06 DIAGNOSIS — N486 Induration penis plastica: Secondary | ICD-10-CM | POA: Diagnosis not present

## 2021-12-06 DIAGNOSIS — N5201 Erectile dysfunction due to arterial insufficiency: Secondary | ICD-10-CM | POA: Diagnosis not present

## 2021-12-12 DIAGNOSIS — N486 Induration penis plastica: Secondary | ICD-10-CM | POA: Diagnosis not present

## 2021-12-12 DIAGNOSIS — N5201 Erectile dysfunction due to arterial insufficiency: Secondary | ICD-10-CM | POA: Diagnosis not present

## 2021-12-23 DIAGNOSIS — D2271 Melanocytic nevi of right lower limb, including hip: Secondary | ICD-10-CM | POA: Diagnosis not present

## 2021-12-23 DIAGNOSIS — D2272 Melanocytic nevi of left lower limb, including hip: Secondary | ICD-10-CM | POA: Diagnosis not present

## 2021-12-23 DIAGNOSIS — L821 Other seborrheic keratosis: Secondary | ICD-10-CM | POA: Diagnosis not present

## 2021-12-23 DIAGNOSIS — D2262 Melanocytic nevi of left upper limb, including shoulder: Secondary | ICD-10-CM | POA: Diagnosis not present

## 2021-12-23 DIAGNOSIS — D2261 Melanocytic nevi of right upper limb, including shoulder: Secondary | ICD-10-CM | POA: Diagnosis not present

## 2021-12-23 DIAGNOSIS — D225 Melanocytic nevi of trunk: Secondary | ICD-10-CM | POA: Diagnosis not present

## 2021-12-23 DIAGNOSIS — X32XXXA Exposure to sunlight, initial encounter: Secondary | ICD-10-CM | POA: Diagnosis not present

## 2021-12-23 DIAGNOSIS — L570 Actinic keratosis: Secondary | ICD-10-CM | POA: Diagnosis not present

## 2022-01-08 DIAGNOSIS — Z4789 Encounter for other orthopedic aftercare: Secondary | ICD-10-CM | POA: Diagnosis not present

## 2022-01-23 ENCOUNTER — Other Ambulatory Visit: Payer: Self-pay | Admitting: Cardiology

## 2022-01-24 DIAGNOSIS — E103593 Type 1 diabetes mellitus with proliferative diabetic retinopathy without macular edema, bilateral: Secondary | ICD-10-CM | POA: Diagnosis not present

## 2022-01-24 DIAGNOSIS — H33103 Unspecified retinoschisis, bilateral: Secondary | ICD-10-CM | POA: Diagnosis not present

## 2022-01-24 DIAGNOSIS — Z961 Presence of intraocular lens: Secondary | ICD-10-CM | POA: Diagnosis not present

## 2022-01-24 DIAGNOSIS — E113291 Type 2 diabetes mellitus with mild nonproliferative diabetic retinopathy without macular edema, right eye: Secondary | ICD-10-CM | POA: Diagnosis not present

## 2022-01-24 DIAGNOSIS — E103592 Type 1 diabetes mellitus with proliferative diabetic retinopathy without macular edema, left eye: Secondary | ICD-10-CM | POA: Diagnosis not present

## 2022-02-07 ENCOUNTER — Other Ambulatory Visit: Payer: Self-pay

## 2022-02-07 NOTE — Patient Outreach (Signed)
  Care Coordination   Initial Visit Note   02/07/2022 Name: Travis Fletcher. MRN: [id number] DOB: 1956-05-20  Travis Fletcher Travis Fletcher. is a 66 y.o. year old male who sees Travis Pillar, Fletcher for primary care. I spoke with  Travis Fletcher. by phone today.  What matters to the patients health and wellness today?  None reported. Patient states things going well as far as his health is concerned. He reports he no longer sees Travis Fletcher MDs for PCP-was dissatisfied with service. Travis Fletcher is leaving. Patient has not decided what Fletcher he will pursue as PCP. In the meantime, he has several specialists that he is seeing. He will decide on new PCP soon. Denies any RN CM needs or concerns at this time.     Goals Addressed             This Visit's Progress    COMPLETED: Care Coordination Activities-no follow up required       Care Coordination Interventions: Provided education to patient re: obtaining new PCP-he can contact [employer] for list of in-network providers Assessed social determinant of health barriers          SDOH assessments and interventions completed:  Yes  SDOH Interventions Today    Flowsheet Row Most Recent Value  SDOH Interventions   Food Insecurity Interventions Intervention Not Indicated  Transportation Interventions Intervention Not Indicated        Care Coordination Interventions Activated:  Yes  Care Coordination Interventions:  Yes, provided patient with info on obtaining new PCP  Follow up plan: No further intervention required.   Encounter Outcome:  Pt. Visit Completed    Enzo Montgomery, RN,BSN,CCM [employer] [facility] Telephonic Care Management Coordinator Direct Phone: [phone] Toll Free: [phone] Fax: [phone]

## 2022-02-13 DIAGNOSIS — E103592 Type 1 diabetes mellitus with proliferative diabetic retinopathy without macular edema, left eye: Secondary | ICD-10-CM | POA: Diagnosis not present

## 2022-02-13 DIAGNOSIS — E1065 Type 1 diabetes mellitus with hyperglycemia: Secondary | ICD-10-CM | POA: Diagnosis not present

## 2022-02-13 DIAGNOSIS — E103291 Type 1 diabetes mellitus with mild nonproliferative diabetic retinopathy without macular edema, right eye: Secondary | ICD-10-CM | POA: Diagnosis not present

## 2022-02-13 DIAGNOSIS — Z794 Long term (current) use of insulin: Secondary | ICD-10-CM | POA: Diagnosis not present

## 2022-02-13 DIAGNOSIS — Z87442 Personal history of urinary calculi: Secondary | ICD-10-CM | POA: Diagnosis not present

## 2022-02-18 DIAGNOSIS — N486 Induration penis plastica: Secondary | ICD-10-CM | POA: Diagnosis not present

## 2022-02-18 DIAGNOSIS — C61 Malignant neoplasm of prostate: Secondary | ICD-10-CM | POA: Diagnosis not present

## 2022-02-18 DIAGNOSIS — N5201 Erectile dysfunction due to arterial insufficiency: Secondary | ICD-10-CM | POA: Diagnosis not present

## 2022-02-20 ENCOUNTER — Other Ambulatory Visit: Payer: Self-pay | Admitting: Cardiology

## 2022-03-03 ENCOUNTER — Other Ambulatory Visit: Payer: Self-pay | Admitting: Cardiology

## 2022-05-16 DIAGNOSIS — Z87442 Personal history of urinary calculi: Secondary | ICD-10-CM | POA: Diagnosis not present

## 2022-05-16 DIAGNOSIS — E1065 Type 1 diabetes mellitus with hyperglycemia: Secondary | ICD-10-CM | POA: Diagnosis not present

## 2022-05-16 DIAGNOSIS — E103291 Type 1 diabetes mellitus with mild nonproliferative diabetic retinopathy without macular edema, right eye: Secondary | ICD-10-CM | POA: Diagnosis not present

## 2022-05-16 DIAGNOSIS — Z794 Long term (current) use of insulin: Secondary | ICD-10-CM | POA: Diagnosis not present

## 2022-05-16 DIAGNOSIS — E103592 Type 1 diabetes mellitus with proliferative diabetic retinopathy without macular edema, left eye: Secondary | ICD-10-CM | POA: Diagnosis not present

## 2022-05-22 ENCOUNTER — Encounter: Payer: Self-pay | Admitting: Pharmacist

## 2022-05-22 NOTE — Progress Notes (Signed)
[place]) [place] Team Statin Quality Measure Assessment  05/22/2022  Aahan Marques Eriq Hufford. Oct 24, 1955 [id number]  Per review of chart and payor information, patient has a diagnosis of diabetes and cardiovascular disease but is not currently filling a statin prescription.  This places patient into the Statin Use In Patients with Diabetes (SUPD) and Statin Use in Patients with Cardiovascular Disease (SPC) measures for CMS.    Patient has documented allergy to statin but no corresponding CPT codes that would exclude patient from SUPD and [facility] measure.     Component Value Date/Time   CHOL 144 05/29/2020 0910   TRIG 91 05/29/2020 0910   HDL 60 05/29/2020 0910   CHOLHDL 2.4 06/15/2015 0651   VLDL 15 06/15/2015 0651   LDLCALC 67 05/29/2020 0910    Please consider ONE of the following recommendations:  Initiate high intensity statin Atorvastatin 40 mg once daily, #90, 3 refills   Rosuvastatin 20 mg once daily, #90, 3 refills    Initiate moderate intensity  statin with reduced frequency if prior  statin intolerance 1x weekly, #13, 3 refills   2x weekly, #26, 3 refills   3x weekly, #39, 3 refills    Code for past statin intolerance  (required annually)  Provider Requirements: Must associate code during an office visit or telehealth encounter   Drug Induced Myopathy G72.0   Myositis, unspecified M60.9   Myopathy, unspecified G72.9   Rhabdomyolysis  M62.82   Myalgia (SPC ONLY) W10.2   Alcoholic cirrhosis of liver without ascites V25.36   Alcoholic cirrhosis of liver with ascites K70.31   Unspecified cirrhosis of liver K74.60   Toxic liver disease with fibrosis and cirrhosis of liver K71.7   Thank you for allowing [facility] pharmacy to be a part of this patient's care.   Loretha Brasil, PharmD [place] Pharmacist Office: [phone]

## 2022-05-23 ENCOUNTER — Encounter: Payer: Self-pay | Admitting: Cardiology

## 2022-05-23 ENCOUNTER — Ambulatory Visit: Payer: Medicare HMO | Admitting: Cardiology

## 2022-05-23 VITALS — BP 133/72 | HR 76 | Resp 16 | Ht 69.0 in | Wt 211.0 lb

## 2022-05-23 DIAGNOSIS — I340 Nonrheumatic mitral (valve) insufficiency: Secondary | ICD-10-CM | POA: Diagnosis not present

## 2022-05-23 DIAGNOSIS — E7800 Pure hypercholesterolemia, unspecified: Secondary | ICD-10-CM

## 2022-05-23 DIAGNOSIS — I25118 Atherosclerotic heart disease of native coronary artery with other forms of angina pectoris: Secondary | ICD-10-CM

## 2022-05-23 DIAGNOSIS — E1065 Type 1 diabetes mellitus with hyperglycemia: Secondary | ICD-10-CM | POA: Diagnosis not present

## 2022-05-23 MED ORDER — AMOXICILLIN 500 MG PO TABS: 2000.0000 mg | ORAL_TABLET | ORAL | 5 refills | Status: DC

## 2022-05-23 MED ORDER — METOPROLOL TARTRATE 25 MG PO TABS: 25.0000 mg | ORAL_TABLET | Freq: Two times a day (BID) | ORAL | 3 refills | Status: DC

## 2022-05-23 NOTE — Progress Notes (Signed)
Primary Physician/Referring:  Kelton Pillar, MD  Patient ID: Travis Fletcher., male    DOB: 05/16/1956, 66 y.o.   MRN: [id number]  Chief Complaint  Patient presents with   Coronary Artery Disease   Hyperlipidemia   Follow-up    1 year   HPI:    Travis Mark.  is a 66 y.o. male  with coronary artery disease, type 1 diabetes mellitus which is fairly controlled now, hyperlipidemia, inability to tolerate statins due to statin myopathy, presently on Praluent presents here for annual visit.  Patient has recently had episodes of chest pain improving with sublingual nitroglycerin.  1 particular episode occurred at a funeral service.  He endorses not doing any regular physical activity or walking, thereby possibly masking his symptoms otherwise.  On a separate note, he requested antibiotic prophylaxis for dental procedure, given his mitral regurgitation.  He has had 2 sisters who developed endocarditis, external to be fatal.  Past Medical History:  Diagnosis Date   Anginal pain ([place])    Arthritis    "hands" (07/11/2014)   Asthma    mild   CAD (coronary artery disease), native coronary artery 07/24/2018   Coronary artery disease    H/O pneumothorax    spontaneous, when patient was 66 years old   High cholesterol    History of COVID-19 06/2019   x 2; 02/2021   History of kidney stones    Hypercholesteremia 07/24/2018   Kidney stones    "passed them all"   Neuromuscular disorder ([place])    neuropathy   Prostate cancer ([place]) 2010   Statin intolerance 07/24/2018   Type 1 diabetes mellitus with other specified complication [facility])    Past Surgical History:  Procedure Laterality Date   BALLOON ANGIOPLASTY, ARTERY  07/13/2012   CARDIAC CATHETERIZATION N/A 06/15/2015   Procedure: Left Heart Cath and Coronary Angiography;  Surgeon: Adrian Prows, MD;  Location: [place] [facility];  Service: Cardiovascular;  Laterality: N/A;   CARDIAC CATHETERIZATION N/A 06/15/2015    Procedure: Coronary Stent Intervention;  Surgeon: Adrian Prows, MD;  Location: [place] [facility];  Service: Cardiovascular;  Laterality: N/A;   CATARACT EXTRACTION W/ INTRAOCULAR LENS  IMPLANT, BILATERAL Bilateral ~ [place] ARTHROPLASTY N/A 07/17/2021   Procedure: CERVICAL ANTERIOR DISC ARTHROPLASTY C5-6;  Surgeon: Melina Schools, MD;  Location: [place];  Service: Orthopedics;  Laterality: N/A;  3 hrs 3 C-Bed   CORONARY ANGIOPLASTY  07/11/2014   CORONARY ANGIOPLASTY WITH STENT PLACEMENT  2012   "1"   EYE SURGERY     INGUINAL HERNIA REPAIR Left [place] RADIATION SEED  2010   LEFT HEART CATH AND CORONARY ANGIOGRAPHY N/A 06/26/2017   Procedure: LEFT HEART CATH AND CORONARY ANGIOGRAPHY;  Surgeon: Adrian Prows, MD;  Location: [place] [facility];  Service: Cardiovascular;  Laterality: N/A;   LEFT HEART CATH AND CORONARY ANGIOGRAPHY N/A 08/30/2019   Procedure: LEFT HEART CATH AND CORONARY ANGIOGRAPHY;  Surgeon: Adrian Prows, MD;  Location: [place] [facility];  Service: Cardiovascular;  Laterality: N/A;   LEFT HEART CATHETERIZATION WITH CORONARY ANGIOGRAM N/A 07/13/2012   Procedure: LEFT HEART CATHETERIZATION WITH CORONARY ANGIOGRAM;  Surgeon: Laverda Page, MD;  Location[facility];  Service: Cardiovascular;  Laterality: N/A;   LEFT HEART CATHETERIZATION WITH CORONARY ANGIOGRAM N/A 07/11/2014   Procedure: LEFT HEART CATHETERIZATION WITH CORONARY ANGIOGRAM;  Surgeon: Laverda Page, MD;  Location: [facility] C[facility] LAB;  Service: Cardiovascular;  Laterality: N/A;  RETINAL DETACHMENT SURGERY Left 2010   RETINAL LASER PROCEDURE Right 2010   THYROIDECTOMY, PARTIAL  ~ [place]   Family History  Problem Relation Age of Onset   Stroke Mother 108   Atrial fibrillation Mother    Atrial fibrillation Father    Heart disease Sister    Pancreatic cancer Paternal Uncle    Mesothelioma Paternal Uncle     Social History   Tobacco Use   Smoking status:  Never   Smokeless tobacco: Never  Substance Use Topics   Alcohol use: Yes    Comment: 1-2 beers, a few days a week or less   Marital Status: Married  ROS  Review of Systems  Cardiovascular:  Positive for chest pain, dyspnea on exertion (stable) and palpitations (occasional). Negative for leg swelling and syncope.   Objective  Blood pressure 133/72, pulse 76, resp. rate 16, height _0  (1.753 m), weight 211 lb (95.7 kg), SpO2 96 %.     05/23/2022   12:10 PM 07/18/2021    7:27 AM 07/18/2021    4:50 AM  Vitals with BMI  Height _1     Weight 211 lbs    BMI 20.25    Systolic 427 062 376  Diastolic 72 58 66  Pulse 76 98 100     Physical Exam Vitals and nursing note reviewed.  Constitutional:      General: He is not in acute distress. Neck:     Vascular: No JVD.  Cardiovascular:     Rate and Rhythm: Normal rate and regular rhythm.     Pulses: Intact distal pulses.     Heart sounds: Normal heart sounds. No murmur heard. Pulmonary:     Effort: Pulmonary effort is normal.     Breath sounds: Normal breath sounds. No wheezing or rales.    Laboratory examination:   Recent Labs    07/15/21 1002  NA 136  K 4.3  CL 98  CO2 30  GLUCOSE 178*  BUN 13  CREATININE 0.94  CALCIUM 9.3  GFRNONAA >60    CrCl cannot be calculated (Patient's most recent lab result is older than the maximum 21 days allowed.).     Latest Ref Rng & Units 07/15/2021   10:02 AM 08/19/2019   10:02 AM 06/26/2017    7:48 AM  CMP  Glucose 70 - 99 mg/dL 178  97  203   BUN 8 - 23 mg/dL _2 Creatinine 0.61 - 1.24 mg/dL 0.94  1.04  0.91   Sodium 135 - 145 mmol/L 136  140  137   Potassium 3.5 - 5.1 mmol/L 4.3  4.7  4.2   Chloride 98 - 111 mmol/L 98  102  104   CO2 22 - 32 mmol/L _3 Calcium 8.9 - 10.3 mg/dL 9.3  9.7  9.2       Latest Ref Rng & Units 07/15/2021   10:02 AM 08/19/2019   10:02 AM 06/26/2017    7:48 AM  CBC  WBC 4.0 - 10.5 K/uL 8.0  5.7  8.5   Hemoglobin 13.0 - 17.0 g/dL  16.3  16.5  15.7   Hematocrit 39.0 - 52.0 % 47.4  48.9  44.6   Platelets 150 - 400 K/uL 228  240  236    Lipid Panel No results for input(s): "CHOL", "TRIG", "LDLCALC", "VLDL", "HDL", "CHOLHDL", "LDLDIRECT" in the last 8760 hours.  External labs:   Labs 05/06/2021:  A1c 7.6%.  TSH 2.52, normal.  Serum glucose 142 mg, BUN 10, creatinine 0.94, EGFR 91 mL, potassium 5.1.  CMP normal.  Labs 01/04/2021:  Total cholesterol 134, triglycerides 94, HDL 61, LDL 56.  Non-HDL cholesterol 74.  TSH 2.180 01/02/2020  A1C 7.100 % 01/02/2020   Medications and allergies   Allergies  Allergen Reactions   Brilinta [Ticagrelor] Shortness Of Breath   Statins     Muscle pain, fatigue     Current Outpatient Medications on File Prior to Visit  Medication Sig Dispense Refill   acetaminophen (TYLENOL) 325 MG tablet Take 650 mg by mouth every 6 (six) hours as needed for moderate pain or headache.     albuterol (VENTOLIN HFA) 108 (90 Base) MCG/ACT inhaler Inhale 1 puff into the lungs every 6 (six) hours as needed for wheezing or shortness of breath.      aspirin 81 MG tablet Take 1 tablet (81 mg total) by mouth daily.     guaiFENesin (MUCINEX) 600 MG 12 hr tablet Take 600 mg by mouth 2 (two) times daily as needed (congestion).     insulin aspart (NOVOLOG) 100 UNIT/ML injection Inject into the skin See admin instructions. Uses via Insulin Pump     losartan (COZAAR) 25 MG tablet TAKE HALF A TABLET BY MOUTH DAILY 90 tablet 0   Melatonin 5 MG CAPS Take 5 mg by mouth at bedtime.     nitroGLYCERIN (NITROSTAT) 0.4 MG SL tablet Place 1 tablet (0.4 mg total) under the tongue every 5 (five) minutes as needed for chest pain. 30 tablet 6   Polyethyl Glycol-Propyl Glycol (SYSTANE OP) Place 1 drop into both eyes daily as needed (dry eyes).     ranolazine (RANEXA) 500 MG 12 hr tablet TAKE 1 TABLET BY MOUTH TWICE A DAY 180 tablet 1   REPATHA SURECLICK 706 MG/ML SOAJ INJECT 1 APPLICATION INTO THE SKIN EVERY14 DAYS 2 mL  5   No current facility-administered medications on file prior to visit.    Radiology:   No results found.  Cardiac Studies:   Lexiscan Sestamibi stress test 09/04/2017:  1. Pharmacologic stress testing was performed with intravenous administration of .4 mg of Lexiscan over a 10-15 seconds infusion. Stress symptoms included dyspnea and abdominal pain. Normal blood pressure. Exercise capacity not assessed. Stress EKG is non diagnostic for ischemia as it is a pharmacologic stress.  2. The overall quality of the study is excellent. There is no evidence of abnormal lung activity. Stress and rest SPECT images demonstrate homogeneous tracer distribution throughout the myocardium. Gated SPECT imaging reveals normal myocardial thickening and wall motion. The left ventricular ejection fraction was normal (81%).   3. Low risk study.  Coronary angiogram 06/26/2017: Normal LV systolic function, no regional wall motion of a mallet he. Widely patent mid LAD stent and mid circumflex stent and balloon angioplasty site to the ramus intermediate. The mid to distal LAD has a focal 60-70% stenosis. Apical LAD has a ulcerated 95-99% stenosis.  Echocardiogram 07/20/2019:  Left ventricle cavity is normal in size. Mild concentric hypertrophy of the left ventricle. Normal LV systolic function with EF 65%. Normal global wall motion. Normal diastolic filling pattern. Calculated EF 65%.  Mild (Grade I) mitral regurgitation.  Mild tricuspid regurgitation. Estimated pulmonary artery systolic pressure is 26 mmHg.  No significant change compared to previous study on 08/27/2017.  72-hour Holter monitor 2/8-2/04/2020:  Normal sinus rhythm.  Minimum heart rate 66 bpm, max heart  rate 113 bpm.  4.73% tachycardia burden.  Occasional PVC (1.32% burden) in the form of isolated, couplets, and in a bigeminal pattern.  Rare PAC.  No reported symptoms.  No A. fib or SVT was noted.  CPX 08/01/2019: Exercise testing with gas exchange  demonstrates low-normal functional capacity when compared to matched sedentary norms. There are no clear indications for cardiopulmonary limitations. At peak exercise there was normal O2 pulse.  Low normal functional capacity. No evidence of HF limitation. At peak exercise, there was lateral ST depression suggestive of ischemia.   Left Heart Catheterization 08/30/19:   The left ventricular systolic function is normal. LV end diastolic pressure is normal. LV: Normal LV systolic function.  Normal EDP. No change in coronary anatomy since 06/26/2017. Apical LAD ulcerated stenosis has completely healed. Small vessel disease and microvascular angina. Previous PTCA site widely patent: See below.  LAD stent placed on 06/15/2015 with 3 x 26 mm resolute stent. Ramus intermediate balloon angioplasty on 07/11/2014 and mid circumflex stent with a 4.0 x 15 mm integrity non-DES stent in 2012.   Recommendation: Patient's angina pectoris is related to small vessel disease and microvascular angina pectoris in view of uncontrolled diabetes mellitus.  I have reassured him.  50 mL contrast utilized.  EKG 05/23/2022: Sinus rhythm 77 bpm Low voltage precordial leads Possible old anteroseptal infarct  Assessment     ICD-10-CM   1. Coronary artery disease of native artery of native heart with stable angina pectoris ([place])  I25.118 EKG 12-Lead    2. Hypercholesteremia  E78.00     3. Uncontrolled type 1 diabetes mellitus with hyperglycemia (H[facility])  E10.65        Recommendations:   Ebbie Cherry.  is a 66 y.o. male  with coronary artery disease, type 1 diabetes mellitus, hyperlipidemia, inability to tolerate statins due to statin myopathy, presently on Praluent presents here for annual visit.  Recent anginal symptoms triggered by emotional stress.  Symptoms otherwise likely most due to low baseline physical activity level.  Known prior PCI.  Recommend increasing metoprolol tartrate to 25 mg twice  daily, and obtain echocardiogram and exercise nuclear stress testing.  At next visit, he will bring paperwork for annual grant approval for continued treatment with Praluent.  At any time, if this is not approved by insurance, we could consider switching to [facility] after he turns 60 and has Medicare.  F/u w/ Dr. Einar Gip after stress testing    Nigel Mormon, MD, [facility] 05/23/2022, 1:09 PM Office: [phone] Pager: [phone]

## 2022-06-12 ENCOUNTER — Other Ambulatory Visit: Payer: Self-pay

## 2022-06-12 MED ORDER — NITROGLYCERIN 0.4 MG SL SUBL: 0.4000 mg | SUBLINGUAL_TABLET | SUBLINGUAL | 6 refills | Status: AC | PRN

## 2022-06-16 ENCOUNTER — Ambulatory Visit: Payer: Medicare HMO

## 2022-06-16 ENCOUNTER — Other Ambulatory Visit: Payer: Self-pay

## 2022-06-16 DIAGNOSIS — I25118 Atherosclerotic heart disease of native coronary artery with other forms of angina pectoris: Secondary | ICD-10-CM

## 2022-06-16 MED ORDER — REPATHA SURECLICK 140 MG/ML ~~LOC~~ SOAJ: 140.0000 mg | SUBCUTANEOUS | 5 refills | Status: DC

## 2022-06-18 ENCOUNTER — Ambulatory Visit: Payer: Medicare HMO

## 2022-06-18 DIAGNOSIS — I25118 Atherosclerotic heart disease of native coronary artery with other forms of angina pectoris: Secondary | ICD-10-CM

## 2022-06-26 ENCOUNTER — Encounter: Payer: Self-pay | Admitting: Cardiology

## 2022-06-26 ENCOUNTER — Ambulatory Visit: Payer: Medicare HMO | Admitting: Cardiology

## 2022-06-26 VITALS — BP 136/74 | HR 79 | Resp 16 | Ht 69.0 in | Wt 204.8 lb

## 2022-06-26 DIAGNOSIS — I25118 Atherosclerotic heart disease of native coronary artery with other forms of angina pectoris: Secondary | ICD-10-CM

## 2022-06-26 DIAGNOSIS — E7800 Pure hypercholesterolemia, unspecified: Secondary | ICD-10-CM | POA: Diagnosis not present

## 2022-06-26 DIAGNOSIS — I340 Nonrheumatic mitral (valve) insufficiency: Secondary | ICD-10-CM

## 2022-06-26 NOTE — Progress Notes (Signed)
Primary Physician/Referring:  Antony Contras, MD  Patient ID: Travis Kung., male    DOB: 10-30-1955, 67 y.o.   MRN: [id number]  Chief Complaint  Patient presents with   Coronary Artery Disease   Results    Nuclear stress test   Follow-up   HPI:    Travis Yepiz.  is a 67 y.o. male  with coronary artery disease, type 1 diabetes mellitus which is fairly controlled now, hyperlipidemia, inability to tolerate statins due to statin myopathy, presently on Praluent presents here for follow-up of chest pain, was seen a month ago and in view of recurrent chest discomfort, was started on metoprolol and underwent echocardiogram and stress testing.  Patient was seen in our office a month ago, he was having episodes of exertional chest pain and dyspnea on exertion.  Fortunately his symptoms are completely resolved over the past 2 weeks.   Past Medical History:  Diagnosis Date   Anginal pain ([place])    Arthritis    "hands" (07/11/2014)   Asthma    mild   CAD (coronary artery disease), native coronary artery 07/24/2018   Coronary artery disease    H/O pneumothorax    spontaneous, when patient was 67 years old   High cholesterol    History of COVID-19 06/2019   x 2; 02/2021   History of kidney stones    Hypercholesteremia 07/24/2018   Kidney stones    "passed them all"   Neuromuscular disorder ([place])    neuropathy   Prostate cancer ([place]) 2010   Statin intolerance 07/24/2018   Type 1 diabetes mellitus with other specified complication [facility])    Past Surgical History:  Procedure Laterality Date   BALLOON ANGIOPLASTY, ARTERY  07/13/2012   CARDIAC CATHETERIZATION N/A 06/15/2015   Procedure: Left Heart Cath and Coronary Angiography;  Surgeon: Adrian Prows, MD;  Location: [place] CV LAB;  Service: Cardiovascular;  Laterality: N/A;   CARDIAC CATHETERIZATION N/A 06/15/2015   Procedure: Coronary Stent Intervention;  Surgeon: Adrian Prows, MD;  Location: [place] CV LAB;   Service: Cardiovascular;  Laterality: N/A;   CATARACT EXTRACTION W/ INTRAOCULAR LENS  IMPLANT, BILATERAL Bilateral ~ [place] ARTHROPLASTY N/A 07/17/2021   Procedure: CERVICAL ANTERIOR DISC ARTHROPLASTY C5-6;  Surgeon: Melina Schools, MD;  Location: [place];  Service: Orthopedics;  Laterality: N/A;  3 hrs 3 C-Bed   CORONARY ANGIOPLASTY  07/11/2014   CORONARY ANGIOPLASTY WITH STENT PLACEMENT  2012   "1"   EYE SURGERY     INGUINAL HERNIA REPAIR Left [place] RADIATION SEED  2010   LEFT HEART CATH AND CORONARY ANGIOGRAPHY N/A 06/26/2017   Procedure: LEFT HEART CATH AND CORONARY ANGIOGRAPHY;  Surgeon: Adrian Prows, MD;  Location: [place] CV LAB;  Service: Cardiovascular;  Laterality: N/A;   LEFT HEART CATH AND CORONARY ANGIOGRAPHY N/A 08/30/2019   Procedure: LEFT HEART CATH AND CORONARY ANGIOGRAPHY;  Surgeon: Adrian Prows, MD;  Location: [place] CV LAB;  Service: Cardiovascular;  Laterality: N/A;   LEFT HEART CATHETERIZATION WITH CORONARY ANGIOGRAM N/A 07/13/2012   Procedure: LEFT HEART CATHETERIZATION WITH CORONARY ANGIOGRAM;  Surgeon: Laverda Page, MD;  Location: [facility] CATH LAB;  Service: Cardiovascular;  Laterality: N/A;   LEFT HEART CATHETERIZATION WITH CORONARY ANGIOGRAM N/A 07/11/2014   Procedure: LEFT HEART CATHETERIZATION WITH CORONARY ANGIOGRAM;  Surgeon: Laverda Page, MD;  Location: [facility] CATH LAB;  Service: Cardiovascular;  Laterality: N/A;   RETINAL DETACHMENT SURGERY Left 2010  RETINAL LASER PROCEDURE Right 2010   THYROIDECTOMY, PARTIAL  ~ [place]   Family History  Problem Relation Age of Onset   Stroke Mother 86   Atrial fibrillation Mother    Atrial fibrillation Father    Heart disease Sister    Pancreatic cancer Paternal Uncle    Mesothelioma Paternal Uncle     Social History   Tobacco Use   Smoking status: Never   Smokeless tobacco: Never  Substance Use Topics   Alcohol use: Yes    Comment: 1-2 beers,  a few days a week or less   Marital Status: Married  ROS  Review of Systems  Cardiovascular:  Positive for dyspnea on exertion (stable). Negative for chest pain, leg swelling, palpitations and syncope.   Objective  Blood pressure 136/74, pulse 79, resp. rate 16, height '5\' 9"'$  (1.753 m), weight 204 lb 12.8 oz (92.9 kg), SpO2 98 %.     06/26/2022    9:39 AM 05/23/2022   12:10 PM 07/18/2021    7:27 AM  Vitals with BMI  Height '5\' 9"'$  '5\' 9"'$    Weight 204 lbs 13 oz 211 lbs   BMI 27.74 12.87   Systolic 867 672 094  Diastolic 74 72 58  Pulse 79 76 98     Physical Exam Vitals and nursing note reviewed.  Constitutional:      General: He is not in acute distress. Neck:     Vascular: No JVD.  Cardiovascular:     Rate and Rhythm: Normal rate and regular rhythm.     Pulses: Intact distal pulses.     Heart sounds: Normal heart sounds. No murmur heard. Pulmonary:     Effort: Pulmonary effort is normal.     Breath sounds: Normal breath sounds. No wheezing or rales.    Laboratory examination:   Recent Labs    07/15/21 1002  NA 136  K 4.3  CL 98  CO2 30  GLUCOSE 178*  BUN 13  CREATININE 0.94  CALCIUM 9.3  GFRNONAA >60      Latest Ref Rng & Units 07/15/2021   10:02 AM 08/19/2019   10:02 AM 06/26/2017    7:48 AM  CMP  Glucose 70 - 99 mg/dL 178  97  203   BUN 8 - 23 mg/dL '13  14  12   '$ Creatinine 0.61 - 1.24 mg/dL 0.94  1.04  0.91   Sodium 135 - 145 mmol/L 136  140  137   Potassium 3.5 - 5.1 mmol/L 4.3  4.7  4.2   Chloride 98 - 111 mmol/L 98  102  104   CO2 22 - 32 mmol/L '30  23  23   '$ Calcium 8.9 - 10.3 mg/dL 9.3  9.7  9.2       Latest Ref Rng & Units 07/15/2021   10:02 AM 08/19/2019   10:02 AM 06/26/2017    7:48 AM  CBC  WBC 4.0 - 10.5 K/uL 8.0  5.7  8.5   Hemoglobin 13.0 - 17.0 g/dL 16.3  16.5  15.7   Hematocrit 39.0 - 52.0 % 47.4  48.9  44.6   Platelets 150 - 400 K/uL 228  240  236    Lipid Panel  Lab Results  Component Value Date   CHOL 144 05/29/2020   HDL 60  05/29/2020   LDLCALC 67 05/29/2020   TRIG 91 05/29/2020   CHOLHDL 2.4 06/15/2015     External labs:  Labs 05/25/2022:  A1c 8.0%.  Total cholesterol 130, triglycerides 163, HDL 57, LDL 46.  Serum glucose 220 mg, BUN 15, creatinine 0.93, EGFR 92 mL, sodium 139, potassium 4.8, LFTs normal.  TSH 2.180 01/02/2020   Medications and allergies   Allergies  Allergen Reactions   Brilinta [Ticagrelor] Shortness Of Breath   Statins     Muscle pain, fatigue      Current Outpatient Medications:    acetaminophen (TYLENOL) 325 MG tablet, Take 650 mg by mouth every 6 (six) hours as needed for moderate pain or headache., Disp: , Rfl:    albuterol (VENTOLIN HFA) 108 (90 Base) MCG/ACT inhaler, Inhale 1 puff into the lungs every 6 (six) hours as needed for wheezing or shortness of breath. , Disp: , Rfl:    amoxicillin (AMOXIL) 500 MG tablet, Take 4 tablets (2,000 mg total) by mouth as directed. 30-60 MIN BEFORE DENTAL PROCEDURE, Disp: 4 tablet, Rfl: 5   aspirin 81 MG tablet, Take 1 tablet (81 mg total) by mouth daily., Disp: , Rfl:    guaiFENesin (MUCINEX) 600 MG 12 hr tablet, Take 600 mg by mouth 2 (two) times daily as needed (congestion)., Disp: , Rfl:    insulin aspart (NOVOLOG) 100 UNIT/ML injection, Inject into the skin See admin instructions. Uses via Insulin Pump, Disp: , Rfl:    losartan (COZAAR) 25 MG tablet, TAKE HALF A TABLET BY MOUTH DAILY, Disp: 90 tablet, Rfl: 0   Melatonin 5 MG CAPS, Take 5 mg by mouth at bedtime., Disp: , Rfl:    metoprolol tartrate (LOPRESSOR) 25 MG tablet, Take 1 tablet (25 mg total) by mouth 2 (two) times daily., Disp: 180 tablet, Rfl: 3   nitroGLYCERIN (NITROSTAT) 0.4 MG SL tablet, Place 1 tablet (0.4 mg total) under the tongue every 5 (five) minutes as needed for chest pain., Disp: 90 tablet, Rfl: 6   Polyethyl Glycol-Propyl Glycol (SYSTANE OP), Place 1 drop into both eyes daily as needed (dry eyes)., Disp: , Rfl:    ranolazine (RANEXA) 500 MG 12 hr tablet,  TAKE 1 TABLET BY MOUTH TWICE A DAY, Disp: 180 tablet, Rfl: 1   Evolocumab (REPATHA SURECLICK) 132 MG/ML SOAJ, Inject 140 mg into the skin every 14 (fourteen) days., Disp: 6 mL, Rfl: 5    Radiology:   No results found.  Cardiac Studies:   Coronary angiogram 06/26/2017: Normal LV systolic function, no regional wall motion of a mallet he. Widely patent mid LAD stent and mid circumflex stent and balloon angioplasty site to the ramus intermediate. The mid to distal LAD has a focal 60-70% stenosis. Apical LAD has a ulcerated 95-99% stenosis.  CPX 08/01/2019: Exercise testing with gas exchange demonstrates low-normal functional capacity when compared to matched sedentary norms. There are no clear indications for cardiopulmonary limitations. At peak exercise there was normal O2 pulse.  Low normal functional capacity. No evidence of HF limitation. At peak exercise, there was lateral ST depression suggestive of ischemia.   Left Heart Catheterization 08/30/19:   The left ventricular systolic function is normal. LV end diastolic pressure is normal. LV: Normal LV systolic function.  Normal EDP. No change in coronary anatomy since 06/26/2017. Apical LAD ulcerated stenosis has completely healed. Small vessel disease and microvascular angina. Previous PTCA site widely patent: See below.  LAD stent placed on 06/15/2015 with 3 x 26 mm resolute stent. Ramus intermediate balloon angioplasty on 07/11/2014 and mid circumflex stent with a 4.0 x 15 mm integrity non-DES stent in 2012.  Recommendation: Patient's angina pectoris is related to small vessel disease and microvascular angina pectoris in view of uncontrolled diabetes mellitus.  I have reassured him.  50 mL contrast utilized.  PCV ECHOCARDIOGRAM COMPLETE 06/18/2022  Study Quality: Technically difficult study (suprasternal and subcostal images are sub-optimal). Normal LV systolic function with visual EF 60-65%. No obvious regional wall motion  abnormalities. Left ventricle cavity is normal in size. Normal left ventricular wall thickness. Normal global wall motion. Normal diastolic filling pattern, normal LAP. Aortic valve sclerosis without stenosis. Mild (Grade I) mitral regurgitation. Mild tricuspid regurgitation. No evidence of pulmonary hypertension. Compared to 07/20/2019 otherwise no significant change.  PCV MYOCARDIAL PERFUSION WO LEXISCAN 06/16/2022  Narrative Lexiscan (with Mod Bruce protocol) Nuclear stress test 06/16/2022 Myocardial perfusion is normal. Overall LV systolic function is normal without regional wall motion abnormalities. Stress LV EF: 76%. Low risk study. Nondiagnostic ECG stress. The heart rate response was consistent with Regadenoson. The blood pressure response was physiologic. No change from 09/04/2017    EKG   EKG 05/23/2022: Sinus rhythm 77 bpm  Low voltage precordial leads Possible old anteroseptal infarct  Assessment     ICD-10-CM   1. Coronary artery disease of native artery of native heart with stable angina pectoris ([place])  I25.118     2. Hypercholesteremia  E78.00     3. Nonrheumatic mitral valve regurgitation  I34.0        Recommendations:   Travis Fletcher.  is a 67 y.o. male  with coronary artery disease, type 1 diabetes mellitus which is fairly controlled now, hyperlipidemia, inability to tolerate statins due to statin myopathy, presently on Praluent presents here for follow-up of chest pain, was seen a month ago and in view of recurrent chest discomfort, was started on metoprolol and underwent echocardiogram and stress testing.  1. Coronary artery disease of native artery of native heart with stable angina pectoris [facility]) Patient was seen in our office a month ago, he was having episodes of exertional chest pain and dyspnea on exertion.  Fortunately his symptoms are completely resolved over the past 2 weeks.  He has not used any sublingual nitroglycerin.  I reviewed  results of the stress test, low risk.  Suspect his chest discomfort may have been related to stress from his sister recently having passed with mitral valve disease.  2. Hypercholesteremia He has been out of Repatha, lipids under good control on Repatha.  We have done prior authorization and awaiting response from the company.  Will go ahead and give him a sample dose of Repatha once we have the samples in our office until prior authorization goes through.  Patient will come back for Repatha administration.   He has been out of Repatha for the past 5 weeks.  3. Nonrheumatic mitral valve regurgitation Patient was very concerned about his mild mitral valve regurgitation, echocardiogram was repeated.  His sister recently died with mitral valve infection and also stroke, another sister had endocarditis as well.  He prefers to be on antibiotic prophylaxis.  I reviewed the echocardiogram with the patient and reviewed the images with the patient, he has a very normal-looking mitral valve and I advised him that he does not need endocarditis prophylaxis and his sisters may probably have MVP and myxomatous degeneration.  I have tried to reassure him.  As he is otherwise doing well, I will see him back in 6 months or sooner if problems.    Adrian Prows, MD, [facility] 06/26/2022, 10:16 AM Office: [phone] Pager:  [phone]  

## 2022-06-29 ENCOUNTER — Ambulatory Visit
Admission: RE | Admit: 2022-06-29 | Discharge: 2022-06-29 | Disposition: A | Discharge status: Home / Self Care | Payer: Medicare HMO | Source: Ambulatory Visit | Attending: Urgent Care | Admitting: Urgent Care

## 2022-06-29 VITALS — BP 145/71 | HR 75 | Temp 98.1°F | Resp 18

## 2022-06-29 DIAGNOSIS — J320 Chronic maxillary sinusitis: Secondary | ICD-10-CM

## 2022-06-29 MED ORDER — AMOXICILLIN-POT CLAVULANATE 875-125 MG PO TABS: 1.0000 | ORAL_TABLET | Freq: Two times a day (BID) | ORAL | 0 refills | Status: AC

## 2022-06-29 NOTE — ED Provider Notes (Signed)
Travis Fletcher    CSN: [id number] Arrival date & time: 06/29/22  1147      History   Chief Complaint Chief Complaint  Patient presents with   Nasal Congestion    Possible sinus infection. Have had many times before - Entered by patient   Facial Pain    HPI Travis Fletcher. is a 67 y.o. male.   HPI  Patient presents to urgent care with complaint of sinus pressure and nasal congestion.  He states his symptoms have been present since October but worsening over the past 2 days.  PMH including CAD and DM 1.  Most recent A1c is 11 months ago which indicated good control.  He reports 6 out of 10 pain on the left side of his face from his eyes down to his jaw.  Past Medical History:  Diagnosis Date   Anginal pain ([place])    Arthritis    "hands" (07/11/2014)   Asthma    mild   CAD (coronary artery disease), native coronary artery 07/24/2018   Coronary artery disease    H/O pneumothorax    spontaneous, when patient was 67 years old   High cholesterol    History of COVID-19 06/2019   x 2; 02/2021   History of kidney stones    Hypercholesteremia 07/24/2018   Kidney stones    "passed them all"   Neuromuscular disorder ([facility]ravis Fletcher)    neuropathy   Prostate cancer (Travis Fletcher) 2010   Statin intolerance 07/24/2018   Type 1 diabetes mellitus with other specified complication [facility])     Patient Active Problem List   Diagnosis Date Noted   Uncontrolled type 1 diabetes mellitus with hyperglycemia (Travis Fletcher) 05/23/2022   Nonrheumatic mitral valve regurgitation 05/23/2022   Cervical disc herniation 07/17/2021   History of prostate cancer 03/22/2020   Essential hypertension 03/22/2020   Type 1 diabetes mellitus with complications (Travis Fletcher) 57/90/3833   Angina pectoris ([facility])    CAD (coronary artery disease), native coronary artery 07/24/2018   Hypercholesteremia 07/24/2018   Statin intolerance 07/24/2018   Dyspnea 01/29/2018   Post PTCA 07/11/2014   Mild non proliferative diabetic  retinopathy ([place]) 11/28/2012    Past Surgical History:  Procedure Laterality Date   BALLOON ANGIOPLASTY, ARTERY  07/13/2012   CARDIAC CATHETERIZATION N/A 06/15/2015   Procedure: Left Heart Cath and Coronary Angiography;  Surgeon: Adrian Prows, MD;  Location: [place] CV LAB;  Service: Cardiovascular;  Laterality: N/A;   CARDIAC CATHETERIZATION N/A 06/15/2015   Procedure: Coronary Stent Intervention;  Surgeon: Adrian Prows, MD;  Location: [place] CV LAB;  Service: Cardiovascular;  Laterality: N/A;   CATARACT EXTRACTION W/ INTRAOCULAR LENS  IMPLANT, BILATERAL Bilateral ~ [place] ARTHROPLASTY N/A 07/17/2021   Procedure: CERVICAL ANTERIOR DISC ARTHROPLASTY C5-6;  Surgeon: Melina Schools, MD;  Location: [place];  Service: Orthopedics;  Laterality: N/A;  3 hrs 3 C-Bed   CORONARY ANGIOPLASTY  07/11/2014   CORONARY ANGIOPLASTY WITH STENT PLACEMENT  2012   "1"   EYE SURGERY     INGUINAL HERNIA REPAIR Left [place] RADIATION SEED  2010   LEFT HEART CATH AND CORONARY ANGIOGRAPHY N/A 06/26/2017   Procedure: LEFT HEART CATH AND CORONARY ANGIOGRAPHY;  Surgeon: Adrian Prows, MD;  Location: [place] CV LAB;  Service: Cardiovascular;  Laterality: N/A;   LEFT HEART CATH AND CORONARY ANGIOGRAPHY N/A 08/30/2019   Procedure: LEFT HEART CATH AND CORONARY ANGIOGRAPHY;  Surgeon: Adrian Prows, MD;  Location: [place]  CV LAB;  Service: Cardiovascular;  Laterality: N/A;   LEFT HEART CATHETERIZATION WITH CORONARY ANGIOGRAM N/A 07/13/2012   Procedure: LEFT HEART CATHETERIZATION WITH CORONARY ANGIOGRAM;  Surgeon: Laverda Page, MD;  Location: [facility] CATH LAB;  Service: Cardiovascular;  Laterality: N/A;   LEFT HEART CATHETERIZATION WITH CORONARY ANGIOGRAM N/A 07/11/2014   Procedure: LEFT HEART CATHETERIZATION WITH CORONARY ANGIOGRAM;  Surgeon: Laverda Page, MD;  Location: [facility] CATH LAB;  Service: Cardiovascular;  Laterality: N/A;   RETINAL DETACHMENT SURGERY Left 2010   RETINAL LASER PROCEDURE  Right 2010   THYROIDECTOMY, PARTIAL  ~ Travis Fletcher Medications    Prior to Admission medications   Medication Sig Start Date End Date Taking? Authorizing Provider  acetaminophen (TYLENOL) 325 MG tablet Take 650 mg by mouth every 6 (six) hours as needed for moderate pain or headache.    [provider]  albuterol (VENTOLIN HFA) 108 (90 Base) MCG/ACT inhaler Inhale 1 puff into the lungs every 6 (six) hours as needed for wheezing or shortness of breath.  07/05/19   [provider]  amoxicillin (AMOXIL) 500 MG tablet Take 4 tablets (2,000 mg total) by mouth as directed. 30-60 MIN BEFORE DENTAL PROCEDURE 05/23/22   Patwardhan, Reynold Bowen, MD  aspirin 81 MG tablet Take 1 tablet (81 mg total) by mouth daily. 06/15/15   Adrian Prows, MD  Evolocumab (REPATHA SURECLICK) 428 MG/ML SOAJ Inject 140 mg into the skin every 14 (fourteen) days. 06/16/22   Patwardhan, Reynold Bowen, MD  guaiFENesin (MUCINEX) 600 MG 12 hr tablet Take 600 mg by mouth 2 (two) times daily as needed (congestion).    [provider]  insulin aspart (NOVOLOG) 100 UNIT/ML injection Inject into the skin See admin instructions. Uses via Insulin Pump    [provider]  losartan (COZAAR) 25 MG tablet TAKE HALF A TABLET BY MOUTH DAILY 03/03/22   Adrian Prows, MD  Melatonin 5 MG CAPS Take 5 mg by mouth at bedtime.    [provider]  metoprolol tartrate (LOPRESSOR) 25 MG tablet Take 1 tablet (25 mg total) by mouth 2 (two) times daily. 05/23/22 05/18/23  Patwardhan, Reynold Bowen, MD  nitroGLYCERIN (NITROSTAT) 0.4 MG SL tablet Place 1 tablet (0.4 mg total) under the tongue every 5 (five) minutes as needed for chest pain. 06/12/22   Patwardhan, Reynold Bowen, MD  Polyethyl Glycol-Propyl Glycol (SYSTANE OP) Place 1 drop into both eyes daily as needed (dry eyes).    [provider]  ranolazine (RANEXA) 500 MG 12 hr tablet TAKE 1 TABLET BY MOUTH TWICE A DAY 01/23/22   Adrian Prows, MD    Family History Family History  Problem Relation Age of Onset   Stroke Mother 67   Atrial fibrillation Mother    Atrial fibrillation Father    Heart disease Sister    Pancreatic cancer Paternal Uncle    Mesothelioma Paternal Uncle     Social History Social History   Tobacco Use   Smoking status: Never   Smokeless tobacco: Never  Vaping Use   Vaping Use: Never used  Substance Use Topics   Alcohol use: Yes    Comment: 1-2 beers, a few days a week or less   Drug use: No     Allergies   Brilinta [ticagrelor] and Statins   Review of Systems Review of Systems   Physical Exam Triage Vital Signs ED Triage Vitals  Enc Vitals Group  BP 06/29/22 1209 (!) 145/71     Pulse Rate 06/29/22 1209 75     Resp 06/29/22 1209 18     Temp 06/29/22 1209 98.1 F (36.7 C)     Temp Source 06/29/22 1209 Oral     SpO2 06/29/22 1209 95 %     Weight --      Height --      Head Circumference --      Peak Flow --      Pain Score 06/29/22 1211 0     Pain Loc --      Pain Edu? --      Excl. in [place]? --    No data found.  Updated Vital Signs BP (!) 145/71 (BP Location: Left Arm)   Pulse 75   Temp 98.1 F (36.7 C) (Oral)   Resp 18   SpO2 95%   Visual Acuity Right Eye Distance:   Left Eye Distance:   Bilateral Distance:    Right Eye Near:   Left Eye Near:    Bilateral Near:     Physical Exam Vitals reviewed.  Constitutional:      Appearance: Normal appearance.  Skin:    General: Skin is warm and dry.  Neurological:     General: No focal deficit present.     Mental Status: He is alert and oriented to person, place, and time.  Psychiatric:        Mood and Affect: Mood normal.        Behavior: Behavior normal.      UC Treatments / Results  Labs (all labs ordered are listed, but only abnormal results are displayed) Labs Reviewed - No data to display  EKG   Radiology No results found.  Procedures Procedures (including critical care  time)  Medications Ordered in UC Medications - No data to display  Initial Impression / Assessment and Plan / UC Course  I have reviewed the triage vital signs and the nursing notes.  Pertinent labs & imaging results that were available during my care of the patient were reviewed by me and considered in my medical decision making (see chart for details).   Patient is afebrile here without recent antipyretics. Satting well on room air. Overall is well appearing, well hydrated, without respiratory distress.  Left sided frontal and maxillary sinus tenderness.  Unclear if his symptoms are viral versus bacterial.  He endorses chronic symptoms for multiple months and possibly discrete episodes of sinusitis.  Will cover risk of bacterial involvement with Augmentin.  He is unable to tolerate prednisone due to DM 1.  Also recommending use of nasal decongestant such as Flonase and Sudafed.   Final Clinical Impressions(s) / UC Diagnoses   Final diagnoses:  None   Discharge Instructions   None    ED Prescriptions   None    PDMP not reviewed this encounter.   Rose Phi, [place] 06/29/22 1238

## 2022-06-29 NOTE — Discharge Instructions (Signed)
Follow up here or with your primary care provider if your symptoms are worsening or not improving with treatment.     

## 2022-06-29 NOTE — ED Triage Notes (Signed)
Pt. Presents to UC w/ c/o sinus pressure and nasal congestion that has been present since October but has worsened over the past two days.

## 2022-08-15 DIAGNOSIS — E663 Overweight: Secondary | ICD-10-CM | POA: Diagnosis not present

## 2022-08-15 DIAGNOSIS — E103592 Type 1 diabetes mellitus with proliferative diabetic retinopathy without macular edema, left eye: Secondary | ICD-10-CM | POA: Diagnosis not present

## 2022-08-15 DIAGNOSIS — Z87442 Personal history of urinary calculi: Secondary | ICD-10-CM | POA: Diagnosis not present

## 2022-08-15 DIAGNOSIS — E103291 Type 1 diabetes mellitus with mild nonproliferative diabetic retinopathy without macular edema, right eye: Secondary | ICD-10-CM | POA: Diagnosis not present

## 2022-08-15 DIAGNOSIS — E1065 Type 1 diabetes mellitus with hyperglycemia: Secondary | ICD-10-CM | POA: Diagnosis not present

## 2022-08-15 DIAGNOSIS — Z794 Long term (current) use of insulin: Secondary | ICD-10-CM | POA: Diagnosis not present

## 2022-09-12 DIAGNOSIS — E1065 Type 1 diabetes mellitus with hyperglycemia: Secondary | ICD-10-CM | POA: Diagnosis not present

## 2022-09-18 ENCOUNTER — Other Ambulatory Visit: Payer: Self-pay | Admitting: Cardiology

## 2022-11-18 DIAGNOSIS — I25118 Atherosclerotic heart disease of native coronary artery with other forms of angina pectoris: Secondary | ICD-10-CM | POA: Diagnosis not present

## 2022-11-18 DIAGNOSIS — I10 Essential (primary) hypertension: Secondary | ICD-10-CM | POA: Diagnosis not present

## 2022-11-18 DIAGNOSIS — Z1211 Encounter for screening for malignant neoplasm of colon: Secondary | ICD-10-CM | POA: Diagnosis not present

## 2022-11-18 DIAGNOSIS — G720 Drug-induced myopathy: Secondary | ICD-10-CM | POA: Diagnosis not present

## 2022-11-18 DIAGNOSIS — Z8546 Personal history of malignant neoplasm of prostate: Secondary | ICD-10-CM | POA: Diagnosis not present

## 2022-11-18 DIAGNOSIS — Z0000 Encounter for general adult medical examination without abnormal findings: Secondary | ICD-10-CM | POA: Diagnosis not present

## 2022-11-18 DIAGNOSIS — E103291 Type 1 diabetes mellitus with mild nonproliferative diabetic retinopathy without macular edema, right eye: Secondary | ICD-10-CM | POA: Diagnosis not present

## 2022-11-18 DIAGNOSIS — Z1159 Encounter for screening for other viral diseases: Secondary | ICD-10-CM | POA: Diagnosis not present

## 2022-11-18 DIAGNOSIS — E7800 Pure hypercholesterolemia, unspecified: Secondary | ICD-10-CM | POA: Diagnosis not present

## 2022-11-20 ENCOUNTER — Other Ambulatory Visit: Payer: Self-pay | Admitting: Cardiology

## 2022-11-21 DIAGNOSIS — D7589 Other specified diseases of blood and blood-forming organs: Secondary | ICD-10-CM | POA: Diagnosis not present

## 2022-11-21 DIAGNOSIS — Z794 Long term (current) use of insulin: Secondary | ICD-10-CM | POA: Diagnosis not present

## 2022-11-21 DIAGNOSIS — E669 Obesity, unspecified: Secondary | ICD-10-CM | POA: Diagnosis not present

## 2022-11-21 DIAGNOSIS — E103291 Type 1 diabetes mellitus with mild nonproliferative diabetic retinopathy without macular edema, right eye: Secondary | ICD-10-CM | POA: Diagnosis not present

## 2022-11-21 DIAGNOSIS — E103592 Type 1 diabetes mellitus with proliferative diabetic retinopathy without macular edema, left eye: Secondary | ICD-10-CM | POA: Diagnosis not present

## 2022-12-01 DIAGNOSIS — E1065 Type 1 diabetes mellitus with hyperglycemia: Secondary | ICD-10-CM | POA: Diagnosis not present

## 2022-12-25 ENCOUNTER — Ambulatory Visit: Payer: Medicare HMO | Admitting: Cardiology

## 2022-12-25 ENCOUNTER — Encounter: Payer: Self-pay | Admitting: Cardiology

## 2022-12-25 VITALS — BP 123/72 | HR 73 | Resp 16 | Ht 69.0 in | Wt 210.6 lb

## 2022-12-25 DIAGNOSIS — I25118 Atherosclerotic heart disease of native coronary artery with other forms of angina pectoris: Secondary | ICD-10-CM

## 2022-12-25 DIAGNOSIS — E7800 Pure hypercholesterolemia, unspecified: Secondary | ICD-10-CM | POA: Diagnosis not present

## 2022-12-25 NOTE — Progress Notes (Signed)
Primary Physician/Referring:  Tally Joe, MD  Patient ID: Travis Fletcher., male    DOB: 21-Jul-1955, 67 y.o.   MRN: [id number]  Chief Complaint  Patient presents with   Coronary Artery Disease   Hyperlipidemia   Follow-up    6 months   HPI:    Travis Fletcher.  is a 67 y.o. male  with coronary artery disease, type 1 diabetes mellitus which is well controlled now, hyperlipidemia, inability to tolerate statins due to statin myopathy, presently on Repatha presents here for 44-month follow-up.  Patient was seen in our office in Dec, he was having episodes of exertional chest pain and dyspnea on exertion.  He was evaluated by [place] in the office with stress test and echocardiogram in view of chest pain, fortunately he has not had any recurrence.  He remains asymptomatic.  He is tolerating all his medications well.  Past Medical History:  Diagnosis Date   Anginal pain (HCC)    Arthritis    "hands" (07/11/2014)   Asthma    mild   CAD (coronary artery disease), native coronary artery 07/24/2018   Coronary artery disease    H/O pneumothorax    spontaneous, when patient was 67 years old   High cholesterol    History of COVID-19 06/2019   x 2; 02/2021   History of kidney stones    Hypercholesteremia 07/24/2018   Kidney stones    "passed them all"   Neuromuscular disorder (HCC)    neuropathy   Prostate cancer (HCC) 2010   Statin intolerance 07/24/2018   Type 1 diabetes mellitus with other specified complication [facility])    Past Surgical History:  Procedure Laterality Date   BALLOON ANGIOPLASTY, ARTERY  07/13/2012   CARDIAC CATHETERIZATION N/A 06/15/2015   Procedure: Left Heart Cath and Coronary Angiography;  Surgeon: Yates Decamp, MD;  Location: [facility] INVASIVE CV LAB;  Service: Cardiovascular;  Laterality: N/A;   CARDIAC CATHETERIZATION N/A 06/15/2015   Procedure: Coronary Stent Intervention;  Surgeon: Yates Decamp, MD;  Location: [facility] INVASIVE CV LAB;  Service: Cardiovascular;   Laterality: N/A;   CATARACT EXTRACTION W/ INTRAOCULAR LENS  IMPLANT, BILATERAL Bilateral ~ 2000   CERVICAL DISC ARTHROPLASTY N/A 07/17/2021   Procedure: CERVICAL ANTERIOR DISC ARTHROPLASTY C5-6;  Surgeon: Venita Lick, MD;  Location: [facility] OR;  Service: Orthopedics;  Laterality: N/A;  3 hrs 3 C-Bed   CORONARY ANGIOPLASTY  07/11/2014   CORONARY ANGIOPLASTY WITH STENT PLACEMENT  2012   "1"   EYE SURGERY     INGUINAL HERNIA REPAIR Left 1987   INSERTION PROSTATE RADIATION SEED  2010   LEFT HEART CATH AND CORONARY ANGIOGRAPHY N/A 06/26/2017   Procedure: LEFT HEART CATH AND CORONARY ANGIOGRAPHY;  Surgeon: Yates Decamp, MD;  Location: [facility] INVASIVE CV LAB;  Service: Cardiovascular;  Laterality: N/A;   LEFT HEART CATH AND CORONARY ANGIOGRAPHY N/A 08/30/2019   Procedure: LEFT HEART CATH AND CORONARY ANGIOGRAPHY;  Surgeon: Yates Decamp, MD;  Location: [facility] INVASIVE CV LAB;  Service: Cardiovascular;  Laterality: N/A;   LEFT HEART CATHETERIZATION WITH CORONARY ANGIOGRAM N/A 07/13/2012   Procedure: LEFT HEART CATHETERIZATION WITH CORONARY ANGIOGRAM;  Surgeon: Pamella Pert, MD;  Location: [facility] CATH LAB;  Service: Cardiovascular;  Laterality: N/A;   LEFT HEART CATHETERIZATION WITH CORONARY ANGIOGRAM N/A 07/11/2014   Procedure: LEFT HEART CATHETERIZATION WITH CORONARY ANGIOGRAM;  Surgeon: Pamella Pert, MD;  Location: [facility] CATH LAB;  Service: Cardiovascular;  Laterality: N/A;   RETINAL DETACHMENT SURGERY Left 2010  RETINAL LASER PROCEDURE Right 2010   THYROIDECTOMY, PARTIAL  ~ 1979   TONSILLECTOMY AND ADENOIDECTOMY  1977   Family History  Problem Relation Age of Onset   Stroke Mother 86   Atrial fibrillation Mother    Atrial fibrillation Father    Heart disease Sister    Pancreatic cancer Paternal Uncle    Mesothelioma Paternal Uncle     Social History   Tobacco Use   Smoking status: Never   Smokeless tobacco: Never  Substance Use Topics   Alcohol use: Yes    Comment: 1-2 beers, a few days a week or less    Marital Status: Married  ROS  Review of Systems  Cardiovascular:  Positive for dyspnea on exertion (stable). Negative for chest pain, leg swelling, palpitations and syncope.   Objective  Blood pressure 123/72, pulse 73, resp. rate 16, height 5\' 9"  (1.753 m), weight 210 lb 9.6 oz (95.5 kg), SpO2 98%.     12/25/2022    9:10 AM 06/29/2022   12:09 PM 06/26/2022    9:39 AM  Vitals with BMI  Height 5\' 9"   5\' 9"   Weight 210 lbs 10 oz  204 lbs 13 oz  BMI 31.09  30.23  Systolic 123 145 160  Diastolic 72 71 74  Pulse 73 75 79     Physical Exam Vitals and nursing note reviewed.  Constitutional:      General: He is not in acute distress. Neck:     Vascular: No JVD.  Cardiovascular:     Rate and Rhythm: Normal rate and regular rhythm.     Pulses: Intact distal pulses.     Heart sounds: Normal heart sounds. No murmur heard. Pulmonary:     Effort: Pulmonary effort is normal.     Breath sounds: Normal breath sounds. No wheezing or rales.  Abdominal:     Hernia: A hernia is present. Hernia is present in the ventral area.    Laboratory examination:   External labs:   12/01/2022:  A1c 6.8%.  Hb 15.4/HCT 46.7, platelets 257.  Serum glucose 142 mg, BUN 14, creatinine 1.0, EGFR 84 mm, potassium 4.8.  LFTs normal.  Total cholesterol 271, triglycerides 146, HDL 61, LDL 184.  Labs 05/25/2022:  A1c 8.0%.  Total cholesterol 130, triglycerides 163, HDL 57, LDL 46.  Serum glucose 220 mg, BUN 15, creatinine 0.93, EGFR 92 mL, sodium 139, potassium 4.8, LFTs normal.  TSH 2.180 01/02/2020   Medications and allergies   Allergies  Allergen Reactions   Brilinta [Ticagrelor] Shortness Of Breath   Statins     Muscle pain, fatigue      Current Outpatient Medications:    acetaminophen (TYLENOL) 325 MG tablet, Take 650 mg by mouth every 6 (six) hours as needed for moderate pain or headache., Disp: , Rfl:    albuterol (VENTOLIN HFA) 108 (90 Base) MCG/ACT inhaler, Inhale 1 puff into the  lungs every 6 (six) hours as needed for wheezing or shortness of breath. , Disp: , Rfl:    aspirin 81 MG tablet, Take 1 tablet (81 mg total) by mouth daily., Disp: , Rfl:    Evolocumab (REPATHA SURECLICK) 140 MG/ML SOAJ, Inject 140 mg into the skin every 14 (fourteen) days., Disp: 6 mL, Rfl: 5   guaiFENesin (MUCINEX) 600 MG 12 hr tablet, Take 600 mg by mouth 2 (two) times daily as needed (congestion)., Disp: , Rfl:    insulin aspart (NOVOLOG) 100 UNIT/ML injection, Inject into the skin See admin instructions. Uses  via Insulin Pump, Disp: , Rfl:    losartan (COZAAR) 25 MG tablet, TAKE ONE HALF (1/2) TABLET BY MOUTH ONCE A DAY, Disp: 90 tablet, Rfl: 0   Melatonin 5 MG CAPS, Take 5 mg by mouth at bedtime., Disp: , Rfl:    metoprolol tartrate (LOPRESSOR) 25 MG tablet, Take 1 tablet (25 mg total) by mouth 2 (two) times daily. (Patient taking differently: Take 12.5 mg by mouth 2 (two) times daily.), Disp: 180 tablet, Rfl: 3   nitroGLYCERIN (NITROSTAT) 0.4 MG SL tablet, Place 1 tablet (0.4 mg total) under the tongue every 5 (five) minutes as needed for chest pain., Disp: 90 tablet, Rfl: 6   Polyethyl Glycol-Propyl Glycol (SYSTANE OP), Place 1 drop into both eyes daily as needed (dry eyes)., Disp: , Rfl:    ranolazine (RANEXA) 500 MG 12 hr tablet, TAKE ONE TABLET BY MOUTH TWICE A DAY, Disp: 180 tablet, Rfl: 1    Radiology:   No results found.  Cardiac Studies:   Coronary angiogram 06/26/2017: Normal LV systolic function, no regional wall motion of a mallet he. Widely patent mid LAD stent and mid circumflex stent and balloon angioplasty site to the ramus intermediate. The mid to distal LAD has a focal 60-70% stenosis. Apical LAD has a ulcerated 95-99% stenosis.  CPX 08/01/2019: Exercise testing with gas exchange demonstrates low-normal functional capacity when compared to matched sedentary norms. There are no clear indications for cardiopulmonary limitations. At peak exercise there was normal O2 pulse.   Low normal functional capacity. No evidence of HF limitation. At peak exercise, there was lateral ST depression suggestive of ischemia.   Left Heart Catheterization 08/30/19:   The left ventricular systolic function is normal. LV end diastolic pressure is normal. LV: Normal LV systolic function.  Normal EDP. No change in coronary anatomy since 06/26/2017. Apical LAD ulcerated stenosis has completely healed. Small vessel disease and microvascular angina. Previous PTCA site widely patent: See below.  LAD stent placed on 06/15/2015 with 3 x 26 mm resolute stent. Ramus intermediate balloon angioplasty on 07/11/2014 and mid circumflex stent with a 4.0 x 15 mm integrity non-DES stent in 2012.     Recommendation: Patient's angina pectoris is related to small vessel disease and microvascular angina pectoris in view of uncontrolled diabetes mellitus.  I have reassured him.  50 mL contrast utilized.  PCV ECHOCARDIOGRAM COMPLETE 06/18/2022  Study Quality: Technically difficult study (suprasternal and subcostal images are sub-optimal). Normal LV systolic function with visual EF 60-65%. No obvious regional wall motion abnormalities. Left ventricle cavity is normal in size. Normal left ventricular wall thickness. Normal global wall motion. Normal diastolic filling pattern, normal LAP. Aortic valve sclerosis without stenosis. Mild (Grade I) mitral regurgitation. Mild tricuspid regurgitation. No evidence of pulmonary hypertension. Compared to 07/20/2019 otherwise no significant change.  PCV MYOCARDIAL PERFUSION WO LEXISCAN 06/16/2022  Narrative Lexiscan (with Mod Bruce protocol) Nuclear stress test 06/16/2022 Myocardial perfusion is normal. Overall LV systolic function is normal without regional wall motion abnormalities. Stress LV EF: 76%. Low risk study. Nondiagnostic ECG stress. The heart rate response was consistent with Regadenoson. The blood pressure response was physiologic. No change from  09/04/2017   EKG   EKG 12/25/2022: Normal sinus rhythm at rate of 75 bpm, normal EKG.  No change from 07/24/2021.  Assessment     ICD-10-CM   1. Coronary artery disease of native artery of native heart with stable angina pectoris (HCC)  I25.118 EKG 12-Lead    2. Hypercholesteremia  E78.00  Recommendations:   Prather Failla.  is a 67 y.o. male  with coronary artery disease, type 1 diabetes mellitus which is well controlled now, hyperlipidemia, inability to tolerate statins due to statin myopathy, presently on Repatha presents here for 57-month follow-up.  1. Coronary artery disease of native artery of native heart with stable angina pectoris [facility]) Patient is presently doing well and essentially remains asymptomatic.  No change in the EKG.  He has had a low risk nuclear stress test in January 2024.  He has not had any recurrence of chest pain. - EKG 12-Lead  2. Hypercholesteremia Due to insurance reasons and patient assistance issues, patient had been off of Repatha.  His lipids have increased remarkably high to the baseline values.  However fortunately he is back on Repatha.  Over the past several years since being on Repatha/PCSK9 inhibitors, his lipids have been under excellent control.  He will repeat lipid profile testing when he has next blood draw in the next couple months.  Otherwise no change in his physical exam, no change in his EKG, I will see him back on annual basis.    Yates Decamp, MD, [facility] 12/25/2022, 9:34 AM Office: [phone] Pager: [phone]

## 2022-12-30 DIAGNOSIS — L821 Other seborrheic keratosis: Secondary | ICD-10-CM | POA: Diagnosis not present

## 2022-12-30 DIAGNOSIS — L570 Actinic keratosis: Secondary | ICD-10-CM | POA: Diagnosis not present

## 2022-12-30 DIAGNOSIS — L578 Other skin changes due to chronic exposure to nonionizing radiation: Secondary | ICD-10-CM | POA: Diagnosis not present

## 2022-12-30 DIAGNOSIS — D225 Melanocytic nevi of trunk: Secondary | ICD-10-CM | POA: Diagnosis not present

## 2022-12-30 DIAGNOSIS — D2262 Melanocytic nevi of left upper limb, including shoulder: Secondary | ICD-10-CM | POA: Diagnosis not present

## 2022-12-30 DIAGNOSIS — D2272 Melanocytic nevi of left lower limb, including hip: Secondary | ICD-10-CM | POA: Diagnosis not present

## 2022-12-30 DIAGNOSIS — X32XXXA Exposure to sunlight, initial encounter: Secondary | ICD-10-CM | POA: Diagnosis not present

## 2022-12-30 DIAGNOSIS — D2271 Melanocytic nevi of right lower limb, including hip: Secondary | ICD-10-CM | POA: Diagnosis not present

## 2022-12-30 DIAGNOSIS — D2261 Melanocytic nevi of right upper limb, including shoulder: Secondary | ICD-10-CM | POA: Diagnosis not present

## 2023-02-03 NOTE — Progress Notes (Signed)
No action done

## 2023-02-17 ENCOUNTER — Encounter: Payer: Self-pay | Admitting: Cardiology

## 2023-02-19 ENCOUNTER — Other Ambulatory Visit: Payer: Self-pay | Admitting: Cardiology

## 2023-03-05 ENCOUNTER — Other Ambulatory Visit (HOSPITAL_COMMUNITY): Payer: Self-pay

## 2023-03-06 ENCOUNTER — Other Ambulatory Visit (HOSPITAL_COMMUNITY): Payer: Self-pay

## 2023-03-06 ENCOUNTER — Telehealth: Payer: Self-pay | Admitting: Pharmacy Technician

## 2023-03-06 NOTE — Telephone Encounter (Signed)
Pharmacy Patient Advocate Encounter   Received notification from Pt Calls Messages that prior authorization for repatha is required/requested.   Insurance verification completed.   The patient is insured through [employer] .   Per test claim: The current 03/06/23 day co-pay is, $99.32     .  No PA needed at this time. This test claim was processed through [facility]- copay amounts may vary at other pharmacies due to pharmacy/plan contracts, or as the patient moves through the different stages of their insurance plan.

## 2023-03-31 ENCOUNTER — Other Ambulatory Visit (HOSPITAL_COMMUNITY): Payer: Self-pay

## 2023-03-31 ENCOUNTER — Other Ambulatory Visit: Payer: Self-pay | Admitting: Pharmacist

## 2023-03-31 ENCOUNTER — Telehealth: Payer: Self-pay | Admitting: *Deleted

## 2023-03-31 ENCOUNTER — Telehealth: Payer: Self-pay | Admitting: Cardiology

## 2023-03-31 DIAGNOSIS — E7800 Pure hypercholesterolemia, unspecified: Secondary | ICD-10-CM

## 2023-03-31 DIAGNOSIS — I25118 Atherosclerotic heart disease of native coronary artery with other forms of angina pectoris: Secondary | ICD-10-CM

## 2023-03-31 MED ORDER — REPATHA SURECLICK 140 MG/ML ~~LOC~~ SOAJ: 140.0000 mg | SUBCUTANEOUS | 5 refills | Status: DC

## 2023-03-31 NOTE — Telephone Encounter (Signed)
Pre-operative Risk Assessment    Patient Name: Travis Fletcher.  DOB: 1955-10-28 MRN: [id number]      Request for Surgical Clearance    Procedure:   Colonoscopy   Date of Surgery:  Clearance 04/24/23                                 Surgeon:  Dr. Charlott Rakes Surgeon's Group or Practice Name:  [facility] Phone number:  [phone] Fax number:  [phone]   Type of Clearance Requested:   - Medical  - Pharmacy:  Hold Aspirin Not Indicated   Type of Anesthesia:   Propofol   Additional requests/questions:    Signed, Emmit Pomfret   03/31/2023, 1:33 PM

## 2023-03-31 NOTE — Telephone Encounter (Signed)
Patient states he recently ended a call with Laural Golden regarding a letter for Repatha. He states while on the phone he was unable to find the letter, but he found it now. He states a call back will not be necessary unless Thayer Ohm needs to speak with him again.--FYI.

## 2023-03-31 NOTE — Telephone Encounter (Signed)
Pt has been scheduled for tele pre op appt 04/13/23. Med rec and consent are done.    Patient Consent for Virtual Visit        Travis Fletcher. has provided verbal consent on 03/31/2023 for a virtual visit (video or telephone).   CONSENT FOR VIRTUAL VISIT FOR:  Travis Fletcher.  By participating in this virtual visit I agree to the following:  I hereby voluntarily request, consent and authorize [facility] and its employed or contracted physicians, physician assistants, nurse practitioners or other licensed health care professionals (the Practitioner), to provide me with telemedicine health care services (the "Services") as deemed necessary by the treating Practitioner. I acknowledge and consent to receive the Services by the Practitioner via telemedicine. I understand that the telemedicine visit will involve communicating with the Practitioner through live audiovisual communication technology and the disclosure of certain medical information by electronic transmission. I acknowledge that I have been given the opportunity to request an in-person assessment or other available alternative prior to the telemedicine visit and am voluntarily participating in the telemedicine visit.  I understand that I have the right to withhold or withdraw my consent to the use of telemedicine in the course of my care at any time, without affecting my right to future care or treatment, and that the Practitioner or I may terminate the telemedicine visit at any time. I understand that I have the right to inspect all information obtained and/or recorded in the course of the telemedicine visit and may receive copies of available information for a reasonable fee.  I understand that some of the potential risks of receiving the Services via telemedicine include:  Delay or interruption in medical evaluation due to technological equipment failure or disruption; Information transmitted may not be  sufficient (e.g. poor resolution of images) to allow for appropriate medical decision making by the Practitioner; and/or  In rare instances, security protocols could fail, causing a breach of personal health information.  Furthermore, I acknowledge that it is my responsibility to provide information about my medical history, conditions and care that is complete and accurate to the best of my ability. I acknowledge that Practitioner's advice, recommendations, and/or decision may be based on factors not within their control, such as incomplete or inaccurate data provided by me or distortions of diagnostic images or specimens that may result from electronic transmissions. I understand that the practice of medicine is not an exact science and that Practitioner makes no warranties or guarantees regarding treatment outcomes. I acknowledge that a copy of this consent can be made available to me via my patient portal [facility] MyChart), or I can request a printed copy by calling the office of [facility].    I understand that my insurance will be billed for this visit.   I have read or had this consent read to me. I understand the contents of this consent, which adequately explains the benefits and risks of the Services being provided via telemedicine.  I have been provided ample opportunity to ask questions regarding this consent and the Services and have had my questions answered to my satisfaction. I give my informed consent for the services to be provided through the use of telemedicine in my medical care

## 2023-03-31 NOTE — Telephone Encounter (Signed)
Name: Travis Fletcher.  DOB: May 14, 1956  MRN: [id number]  Primary Cardiologist: None   Preoperative team, please contact this patient and set up a phone call appointment for further preoperative risk assessment. Please obtain consent and complete medication review. Thank you for your help.  I confirm that guidance regarding antiplatelet and oral anticoagulation therapy has been completed and, if necessary, noted below.  If necessary his aspirin may be held for 5 to 7 days prior to his procedure.  Please resume as soon as hemostasis is achieved.  I also confirmed the patient resides in the state of [place]. As per [facility] Medical Board telemedicine laws, the patient must reside in the state in which the provider is licensed.   Ronney Asters, NP 03/31/2023, 1:45 PM [facility]

## 2023-03-31 NOTE — Telephone Encounter (Signed)
Pt has been scheduled for tele pre op appt 04/13/23. Med rec and consent are done.

## 2023-04-13 ENCOUNTER — Ambulatory Visit: Payer: Medicare Other | Attending: Cardiology | Admitting: Student

## 2023-04-13 DIAGNOSIS — Z01810 Encounter for preprocedural cardiovascular examination: Secondary | ICD-10-CM | POA: Diagnosis not present

## 2023-04-13 NOTE — Progress Notes (Signed)
Virtual Visit via Telephone Note   Because of Travis Simonis Authier Jr.'s co-morbid illnesses, he is at least at moderate risk for complications without adequate follow up.  This format is felt to be most appropriate for this patient at this time.  The patient did not have access to video technology/had technical difficulties with video requiring transitioning to audio format only (telephone).  All issues noted in this document were discussed and addressed.  No physical exam could be performed with this format.  Please refer to the patient's chart for his consent to telehealth for [facility].  Evaluation Performed:  Preoperative cardiovascular risk assessment _____________   Date:  04/13/2023   Patient ID:  Travis Fletcher., DOB 1955/11/27, MRN [id number] Patient Location:  Home Provider location:   Office  Primary Care Provider:  Tally Joe, MD Primary Cardiologist:  Yates Decamp, MD  Chief Complaint / Patient Profile   67 y.o. y/o male with a h/o CAD s/p multiple PTCA, mitral valve regurgitation, hypertension, hyperlipidemia, T1DM, prostate cancer who is pending colonoscopy by Dr. Bosie Clos on 04/24/2023 and presents today for telephonic preoperative cardiovascular risk assessment.  History of Present Illness    Travis Fletcher. is a 67 y.o. male who presents via audio/video conferencing for a telehealth visit today.  Pt was last seen in cardiology clinic on 12/25/2022 by Dr. Jacinto Halim.  At that time Usman Millett. was stable from a cardiac standpoint.  The patient is now pending procedure as outlined above. Since his last visit, he is doing well. Patient denies shortness of breath, dyspnea on exertion, lower extremity edema, orthopnea or PND. No chest pain, pressure, or tightness. No palpitations.  He stays active with occasional walking, performing light to moderate household activities, and renovating houses.   Past Medical History     Past Medical History:  Diagnosis Date   Anginal pain (HCC)    Arthritis    "hands" (07/11/2014)   Asthma    mild   CAD (coronary artery disease), native coronary artery 07/24/2018   Coronary artery disease    H/O pneumothorax    spontaneous, when patient was 67 years old   High cholesterol    History of COVID-19 06/2019   x 2; 02/2021   History of kidney stones    Hypercholesteremia 07/24/2018   Kidney stones    "passed them all"   Neuromuscular disorder (HCC)    neuropathy   Prostate cancer (HCC) 2010   Statin intolerance 07/24/2018   Type 1 diabetes mellitus with other specified complication [facility])    Past Surgical History:  Procedure Laterality Date   BALLOON ANGIOPLASTY, ARTERY  07/13/2012   CARDIAC CATHETERIZATION N/A 06/15/2015   Procedure: Left Heart Cath and Coronary Angiography;  Surgeon: Yates Decamp, MD;  Location[facility] INVASIVE CV LAB;  Service: Cardiovascular;  Laterality: N/A;   CARDIAC CATHETERIZATION N/A 06/15/2015   Procedure: Coronary Stent Intervention;  Surgeon: Yates Decamp, MD;  Location: [facility] INVASIVE CV LAB;  Service: Cardiovascular;  Laterality: N/A;   CATARACT EXTRACTION W/ INTRAOCULAR LENS  IMPLANT, BILATERAL Bilateral ~ 2000   CERVICAL DISC ARTHROPLASTY N/A 07/17/2021   Procedure: CERVICAL ANTERIOR DISC ARTHROPLASTY C5-6;  Surgeon: Venita Lick, MD;  Location: [facility] OR;  Service: Orthopedics;  Laterality: N/A;  3 hrs 3 C-Bed   CORONARY ANGIOPLASTY  07/11/2014   CORONARY ANGIOPLASTY WITH STENT PLACEMENT  2012   "1"   EYE SURGERY     INGUINAL HERNIA REPAIR Left 1987  INSERTION PROSTATE RADIATION SEED  2010   LEFT HEART CATH AND CORONARY ANGIOGRAPHY N/A 06/26/2017   Procedure: LEFT HEART CATH AND CORONARY ANGIOGRAPHY;  Surgeon: Yates Decamp, MD;  Location: [facility] INVASIVE CV LAB;  Service: Cardiovascular;  Laterality: N/A;   LEFT HEART CATH AND CORONARY ANGIOGRAPHY N/A 08/30/2019   Procedure: LEFT HEART CATH AND CORONARY ANGIOGRAPHY;  Surgeon: Yates Decamp, MD;  Location: [facility]  INVASIVE CV LAB;  Service: Cardiovascular;  Laterality: N/A;   LEFT HEART CATHETERIZATION WITH CORONARY ANGIOGRAM N/A 07/13/2012   Procedure: LEFT HEART CATHETERIZATION WITH CORONARY ANGIOGRAM;  Surgeon: Pamella Pert, MD;  Location: [facility] CATH LAB;  Service: Cardiovascular;  Laterality: N/A;   LEFT HEART CATHETERIZATION WITH CORONARY ANGIOGRAM N/A 07/11/2014   Procedure: LEFT HEART CATHETERIZATION WITH CORONARY ANGIOGRAM;  Surgeon: Pamella Pert, MD;  Location: [facility] CATH LAB;  Service: Cardiovascular;  Laterality: N/A;   RETINAL DETACHMENT SURGERY Left 2010   RETINAL LASER PROCEDURE Right 2010   THYROIDECTOMY, PARTIAL  ~ 1979   TONSILLECTOMY AND ADENOIDECTOMY  1977    Allergies  Allergies  Allergen Reactions   Brilinta [Ticagrelor] Shortness Of Breath   Statins     Muscle pain, fatigue     Home Medications    Prior to Admission medications   Medication Sig Start Date End Date Taking? Authorizing Provider  acetaminophen (TYLENOL) 325 MG tablet Take 650 mg by mouth every 6 (six) hours as needed for moderate pain or headache.    [provider]  albuterol (VENTOLIN HFA) 108 (90 Base) MCG/ACT inhaler Inhale 1 puff into the lungs every 6 (six) hours as needed for wheezing or shortness of breath.  07/05/19   [provider]  aspirin 81 MG tablet Take 1 tablet (81 mg total) by mouth daily. 06/15/15   Yates Decamp, MD  Evolocumab (REPATHA SURECLICK) 140 MG/ML SOAJ Inject 140 mg into the skin every 14 (fourteen) days. 03/31/23   Yates Decamp, MD  guaiFENesin (MUCINEX) 600 MG 12 hr tablet Take 600 mg by mouth 2 (two) times daily as needed (congestion).    [provider]  insulin aspart (NOVOLOG) 100 UNIT/ML injection Inject into the skin See admin instructions. Uses via Insulin Pump    [provider]  losartan (COZAAR) 25 MG tablet TAKE ONE HALF (1/2) TABLET BY MOUTH ONCE A DAY 02/19/23   Yates Decamp, MD  Melatonin 5 MG CAPS Take 5 mg by mouth at bedtime.     [provider]  metoprolol tartrate (LOPRESSOR) 25 MG tablet Take 1 tablet (25 mg total) by mouth 2 (two) times daily. Patient taking differently: Take 12.5 mg by mouth 2 (two) times daily. 05/23/22 05/18/23  Patwardhan, Anabel Bene, MD  nitroGLYCERIN (NITROSTAT) 0.4 MG SL tablet Place 1 tablet (0.4 mg total) under the tongue every 5 (five) minutes as needed for chest pain. 06/12/22   Patwardhan, Anabel Bene, MD  Polyethyl Glycol-Propyl Glycol (SYSTANE OP) Place 1 drop into both eyes daily as needed (dry eyes).    [provider]  ranolazine (RANEXA) 500 MG 12 hr tablet TAKE ONE TABLET BY MOUTH TWICE A DAY 11/21/22   Yates Decamp, MD    Physical Exam    Vital Signs:  Travis Fletcher. does not have vital signs available for review today.  Given telephonic nature of communication, physical exam is limited. AAOx3. NAD. Normal affect.  Speech and respirations are unlabored.  Accessory Clinical Findings    None  Assessment & Plan    Primary  Cardiologist: Yates Decamp, MD  Preoperative cardiovascular risk assessment.  Colonoscopy by Dr. Bosie Clos on 04/24/2023.  Chart reviewed as part of pre-operative protocol coverage. According to the RCRI, patient has a 6.6% risk of MACE. Patient reports activity equivalent to >4.0 METS (light to moderate household chores, occasional walking, renovating houses).   Given past medical history and time since last visit, based on ACC/AHA guidelines, Gracen Southwell. would be at acceptable risk for the planned procedure without further cardiovascular testing.   Patient was advised that if he develops new symptoms prior to surgery to contact our office to arrange a follow-up appointment.  he verbalized understanding.  Ideally aspirin should be continued without interruption, however if the bleeding risk is too great, aspirin may be held for 5-7 days prior to surgery. Please resume aspirin post operatively when it is felt to be safe  from a bleeding standpoint.    I will route this recommendation to the requesting party via Epic fax function.  Please call with questions.  Time:   Today, I have spent 5 minutes with the patient with telehealth technology discussing medical history, symptoms, and management plan.     Carlos Levering, NP  04/13/2023, 7:40 AM

## 2023-04-22 ENCOUNTER — Encounter: Payer: Self-pay | Admitting: Cardiology

## 2023-04-22 ENCOUNTER — Other Ambulatory Visit (HOSPITAL_COMMUNITY): Payer: Self-pay

## 2023-04-22 ENCOUNTER — Telehealth: Payer: Self-pay | Admitting: Pharmacy Technician

## 2023-04-22 NOTE — Telephone Encounter (Signed)
Pharmacy Patient Advocate Encounter   Received notification from Patient Advice Request messages that prior authorization for repatha is required/requested.   Insurance verification completed.   The patient is insured through [employer] .   Per test claim: PA required; PA submitted to above mentioned insurance via Fax Key/confirmation #/EOC faxed Status is pending

## 2023-04-22 NOTE — Telephone Encounter (Signed)
I spoke with [facility].  They have prescription but prior auth is needed.  Per 9/27 phone note no PA is needed at this time.  I let pharmacy know this but they said it is showing a prior auth is needed at this time. Will forward to PA team for follow up

## 2023-04-23 ENCOUNTER — Other Ambulatory Visit (HOSPITAL_COMMUNITY): Payer: Self-pay

## 2023-04-24 ENCOUNTER — Other Ambulatory Visit (HOSPITAL_COMMUNITY): Payer: Self-pay

## 2023-04-24 NOTE — Telephone Encounter (Signed)
Pharmacy Patient Advocate Encounter  Received notification from [employer] that Prior Authorization for repatha has been APPROVED from 01/08/23 to 04/22/24. Ran test claim, Copay is $99.32- one month. This test claim was processed through [facility]- copay amounts may vary at other pharmacies due to pharmacy/plan contracts, or as the patient moves through the different stages of their insurance plan.   PA #/Case ID/Reference #: [id number]

## 2023-06-12 ENCOUNTER — Encounter: Payer: Self-pay | Admitting: Cardiology

## 2023-06-12 DIAGNOSIS — E7800 Pure hypercholesterolemia, unspecified: Secondary | ICD-10-CM

## 2023-06-12 DIAGNOSIS — I25118 Atherosclerotic heart disease of native coronary artery with other forms of angina pectoris: Secondary | ICD-10-CM

## 2023-06-12 MED ORDER — REPATHA SURECLICK 140 MG/ML ~~LOC~~ SOAJ: 140.0000 mg | SUBCUTANEOUS | 3 refills | Status: DC

## 2023-06-26 ENCOUNTER — Other Ambulatory Visit (HOSPITAL_COMMUNITY): Payer: Self-pay

## 2023-06-26 ENCOUNTER — Telehealth: Payer: Self-pay | Admitting: Pharmacy Technician

## 2023-06-26 NOTE — Telephone Encounter (Signed)
Pharmacy Patient Advocate Encounter   Received notification from Patient Advice Request messages that prior authorization for repatha is required/requested.   Insurance verification completed.   The patient is insured through  [employer]  .   Per test claim: PA required; PA submitted to above mentioned insurance via CoverMyMeds Key/confirmation #/EOC [id number] Status is pending

## 2023-06-29 NOTE — Telephone Encounter (Signed)
Sent over documentation of statin intolerance with appeal

## 2023-06-30 NOTE — Telephone Encounter (Signed)
Agree with appeal and he is diabetic with CAD!! Zetia :-)0

## 2023-07-01 NOTE — Telephone Encounter (Signed)
Pharmacy Patient Advocate Encounter  Received notification from  [employer]  that Prior Authorization for Repatha has been APPROVED from 06/26/23 to 12/29/23   PA #/Case ID/Reference #: [id number]

## 2023-07-02 NOTE — Telephone Encounter (Signed)
Call to patient to verify he was able to pick up repatha, no answer, left detailed message per DPR advising PA info was sent to pharmacy of choice. Asked patient to call our office if he has any difficulty picking up script.

## 2023-07-29 ENCOUNTER — Other Ambulatory Visit: Payer: Self-pay | Admitting: Cardiology

## 2023-07-29 DIAGNOSIS — I25118 Atherosclerotic heart disease of native coronary artery with other forms of angina pectoris: Secondary | ICD-10-CM

## 2023-08-06 ENCOUNTER — Telehealth: Payer: Self-pay | Admitting: Cardiology

## 2023-08-06 NOTE — Telephone Encounter (Signed)
   Pt c/o of Chest Pain: STAT if active CP, including tightness, pressure, jaw pain, radiating pain to shoulder/upper arm/back, CP unrelieved by Nitro. Symptoms reported of SOB, nausea, vomiting, sweating.  1. Are you having CP right now? Not at the moment but has today   2. Are you experiencing any other symptoms (ex. SOB, nausea, vomiting, sweating)? SOB   3. Is your CP continuous or coming and going? Come and go   4. Have you taken Nitroglycerin? Yes   5. How long have you been experiencing CP? 1 month    6. If NO CP at time of call then end call with telling Pt to call back or call 911 if Chest pain returns prior to return call from triage team.

## 2023-08-06 NOTE — Telephone Encounter (Signed)
 Spoke with pt who complains of intermittent chest pain x 2 weeks.  Pt denies current CP, SOB or dizziness.  Pt states on Tuesday of this week he did experience discomfort in the center of his chest with radiation to his back, shoulders and neck.  He took 1 nitroglycerine and eventually the "pain eased off."  Pt also reports he had Covid 5-6 weeks ago and saw his PCP last week re: CP and SOB who recommended he follow up with cardiology.   Pt scheduled to see Perlie Gold on 08/07/2023.  Reviewed ED precaution. Pt verbalizes understanding and agrees with current plan.

## 2023-08-07 ENCOUNTER — Ambulatory Visit: Payer: Medicare Other | Attending: Cardiology | Admitting: Cardiology

## 2023-08-07 VITALS — BP 120/60 | HR 76 | Ht 70.0 in | Wt 213.0 lb

## 2023-08-07 DIAGNOSIS — R079 Chest pain, unspecified: Secondary | ICD-10-CM | POA: Diagnosis present

## 2023-08-07 DIAGNOSIS — I25118 Atherosclerotic heart disease of native coronary artery with other forms of angina pectoris: Secondary | ICD-10-CM | POA: Insufficient documentation

## 2023-08-07 DIAGNOSIS — E1065 Type 1 diabetes mellitus with hyperglycemia: Secondary | ICD-10-CM | POA: Diagnosis present

## 2023-08-07 DIAGNOSIS — I10 Essential (primary) hypertension: Secondary | ICD-10-CM | POA: Insufficient documentation

## 2023-08-07 DIAGNOSIS — E7800 Pure hypercholesterolemia, unspecified: Secondary | ICD-10-CM | POA: Diagnosis present

## 2023-08-07 MED ORDER — RANOLAZINE ER 1000 MG PO TB12: 1000.0000 mg | ORAL_TABLET | Freq: Two times a day (BID) | ORAL | 2 refills | Status: DC

## 2023-08-07 NOTE — H&P (View-Only) (Signed)
 Cardiology Office Note:   Date:  08/09/2023  ID:  Travis Fletcher., DOB 01/28/1956, MRN [id number] PCP: Tally Joe, MD  [facility] Providers Cardiologist:  Yates Decamp, MD    History of Present Illness:   Discussed the use of AI scribe software for clinical note transcription with the patient, who gave verbal consent to proceed.  Travis Fletcher. "Travis Fletcher" is a 68 year old male with coronary artery disease, prior stenting to mid LAD in 2017, balloon angioplasty to ramus intermediate 2016, and mid LCX PCI 2012, DM type I, hypertension, statin intolerance who presents for cardiology follow-up regarding chest discomfort.   He has a history of recurrent angina and has undergone both repeat stress test, echocardiogram, and LHC since 2017 without significant changes noted to coronary anatomy. Was started on Ranexa and for a time had improvement in symptoms. He has recently experienced  exertional chest pain described as a pressure radiating down his right arm, neck, and back, relieved by rest and nitroglycerin, though not immediately. The pain has a severity of 7-8 out of 10 during exertion and is similar to previous episodes when he had blockages. He has taken nitroglycerin 3-4 times in the last three weeks. He denies having chest pain while at rest, except possibly once. Patient usually does the cooking but has had to stop due to the exertional chest pain.  He notes that he has had COVID-19 three times, with the first instance affecting his heart and requiring cardiac rehab. He reports COVID 5-6 weeks ago, after which he developed persistent fatigue, weakness, and above mentioned exertional chest pain. He experienced fever for a couple of days during his COVID-19 infection.  He has type one diabetes for 42 years, hyperlipidemia, and statin intolerance with myopathy. He is currently on Repatha for hyperlipidemia and takes Ranexa at 500 mg twice daily for chest pain. He has  experienced side effects from isosorbide mononitrate, including headaches. He recently changed his insurance plan, which affected his access to Repatha, but he resumed regular dosing in December 2024.  On exam today, patient is currently without chest pain, shortness of breath.  Studies Reviewed:    EKG:   EKG Interpretation Date/Time:  Friday August 07 2023 11:35:03 EST Ventricular Rate:  76 PR Interval:  174 QRS Duration:  80 QT Interval:  362 QTC Calculation: 407 R Axis:   9  Text Interpretation: Normal sinus rhythm Cannot rule out Inferior infarct , age undetermined When compared with ECG of 26-Jun-2017 08:01, No significant change was found Confirmed by Perlie Gold [phone]) on 08/07/2023 11:35:50 AM   Coronary angiogram 06/26/2017: Normal LV systolic function, no regional wall motion of a mallet he. Widely patent mid LAD stent and mid circumflex stent and balloon angioplasty site to the ramus intermediate. The mid to distal LAD has a focal 60-70% stenosis. Apical LAD has a ulcerated 95-99% stenosis.   CPX 08/01/2019: Exercise testing with gas exchange demonstrates low-normal functional capacity when compared to matched sedentary norms. There are no clear indications for cardiopulmonary limitations. At peak exercise there was normal O2 pulse.  Low normal functional capacity. No evidence of HF limitation. At peak exercise, there was lateral ST depression suggestive of ischemia.   Left Heart Catheterization 08/30/19:   The left ventricular systolic function is normal. LV end diastolic pressure is normal. LV: Normal LV systolic function.  Normal EDP. No change in coronary anatomy since 06/26/2017. Apical LAD ulcerated stenosis has completely healed. Small vessel disease and microvascular  angina. Previous PTCA site widely patent: See below.  LAD stent placed on 06/15/2015 with 3 x 26 mm resolute stent. Ramus intermediate balloon angioplasty on 07/11/2014 and mid circumflex stent with a  4.0 x 15 mm integrity non-DES stent in 2012.      Recommendation: Patient's angina pectoris is related to small vessel disease and microvascular angina pectoris in view of uncontrolled diabetes mellitus.  I have reassured him.  50 mL contrast utilized.   PCV ECHOCARDIOGRAM COMPLETE 06/18/2022  Study Quality: Technically difficult study (suprasternal and subcostal images are sub-optimal). Normal LV systolic function with visual EF 60-65%. No obvious regional wall motion abnormalities. Left ventricle cavity is normal in size. Normal left ventricular wall thickness. Normal global wall motion. Normal diastolic filling pattern, normal LAP. Aortic valve sclerosis without stenosis. Mild (Grade I) mitral regurgitation. Mild tricuspid regurgitation. No evidence of pulmonary hypertension. Compared to 07/20/2019 otherwise no significant change.   PCV MYOCARDIAL PERFUSION WO LEXISCAN 06/16/2022   Narrative Lexiscan (with Mod Bruce protocol) Nuclear stress test 06/16/2022 Myocardial perfusion is normal. Overall LV systolic function is normal without regional wall motion abnormalities. Stress LV EF: 76%. Low risk study. Nondiagnostic ECG stress. The heart rate response was consistent with Regadenoson. The blood pressure response was physiologic. No change from 09/04/2017  Risk Assessment/Calculations:      Physical Exam:   VS:  BP 120/60 (BP Location: Left Arm)   Pulse 76   Ht 5\' 10"  (1.778 m)   Wt 213 lb (96.6 kg)   SpO2 95%   BMI 30.56 kg/m    Wt Readings from Last 3 Encounters:  08/07/23 213 lb (96.6 kg)  12/25/22 210 lb 9.6 oz (95.5 kg)  06/26/22 204 lb 12.8 oz (92.9 kg)     Physical Exam Vitals reviewed.  Constitutional:      Appearance: Normal appearance.  HENT:     Head: Normocephalic.  Eyes:     Pupils: Pupils are equal, round, and reactive to light.  Cardiovascular:     Rate and Rhythm: Normal rate and regular rhythm.     Pulses: Normal pulses.     Heart sounds: Normal  heart sounds.  Pulmonary:     Effort: Pulmonary effort is normal.     Breath sounds: Normal breath sounds.  Abdominal:     General: Abdomen is flat.     Palpations: Abdomen is soft.  Musculoskeletal:     Right lower leg: No edema.     Left lower leg: No edema.  Skin:    General: Skin is warm and dry.     Capillary Refill: Capillary refill takes less than 2 seconds.  Neurological:     General: No focal deficit present.     Mental Status: He is alert and oriented to person, place, and time.  Psychiatric:        Mood and Affect: Mood normal.        Behavior: Behavior normal.        Thought Content: Thought content normal.        Judgment: Judgment normal.    ASSESSMENT AND PLAN:       Coronary Artery Disease Stable Angina->accelerating angina Coronary artery disease with previous stents and angioplasties. Presents with recent exertional chest pain, consistent with prior angina, onset after COVID-19 infection five weeks ago. Pain relieved by nitroglycerin and rest. EKG normal, previous catheterizations showed stable disease with microvascular involvement. Differential includes microvascular disease and potential progression of coronary artery disease. Last catheterization in 2021 showed  60% stenosis in the distal LAD. Potential progression of stenosis suspected. If catheterization shows stable disease, a PET stress test may be needed to assess microvascular disease. - Order cardiac catheterization with Dr. Jacinto Halim on Thursday - Increase Ranexa to 1000 mg twice daily - Continue nitroglycerin as needed - Continue daily aspirin - Continue Metoprolol - Order pre-catheterization labs including kidney function and LDL  Hypertension BP well controlled. - Continue Metoprolol 25mg  BID and Losartan 12.5mg  daily.  Hyperlipidemia Hyperlipidemia managed with Repatha due to statin intolerance. Issues with medication access last year but now on a regular dosing schedule. - Check LDL levels as  part of pre-catheterization labs  Type 1 Diabetes Mellitus Long-standing type 1 diabetes managed with an insulin pump. No acute issues discussed during this visit. - No changes to current management  Follow-up - Schedule cardiac catheterization with Dr. Jacinto Halim on Thursday - Follow-up with cardiology post-catheterization - Advise to go to the emergency department if symptoms worsen before the scheduled catheterization.         Informed Consent   Shared Decision Making/Informed Consent The risks [stroke (1 in 1000), death (1 in 1000), kidney failure [usually temporary] (1 in 500), bleeding (1 in 200), allergic reaction [possibly serious] (1 in 200)], benefits (diagnostic support and management of coronary artery disease) and alternatives of a cardiac catheterization were discussed in detail with Mr. Michelli and he is willing to proceed.      Signed, Perlie Gold, PA-C

## 2023-08-07 NOTE — Patient Instructions (Addendum)
 Medication: INCREASE Renexa to 1000 mg twice daily    Lab Work: BMP CBC Direct LDL  If you have labs (blood work) drawn today and your tests are completely normal, you will receive your results only by: MyChart Message (if you have MyChart) OR A paper copy in the mail If you have any lab test that is abnormal or we need to change your treatment, we will call you to review the results.   Testing/Procedures: LEFT HEART CATH    Follow-Up: At [facility], you and your health needs are our priority.  As part of our continuing mission to provide you with exceptional heart care, we have created designated Provider Care Teams.  These Care Teams include your primary Cardiologist (physician) and Advanced Practice Providers (APPs -  Physician Assistants and Nurse Practitioners) who all work together to provide you with the care you need, when you need it.   Your next appointment:   1 month(s)  Provider:   Jari Favre, PA-C, Ronie Spies, PA-C, Robin Searing, NP, Jacolyn Reedy, PA-C, Eligha Bridegroom, NP, Tereso Newcomer, PA-C, or Perlie Gold, PA-C     Other Instructions   1st Floor: - Lobby - Registration  - Pharmacy  - Lab - Cafe  2nd Floor: - PV Lab - Diagnostic Testing (echo, CT, nuclear med)  3rd Floor: - Vacant  4th Floor: - TCTS (cardiothoracic surgery) - AFib Clinic - Structural Heart Clinic - Vascular Surgery  - Vascular Ultrasound  5th Floor: - HeartCare Cardiology (general and EP) - Clinical Pharmacy for coumadin, hypertension, lipid, weight-loss medications, and med management appointments    Valet parking services will be available as well.

## 2023-08-07 NOTE — Progress Notes (Signed)
 Cardiology Office Note:   Date:  08/09/2023  ID:  Travis Fletcher Newton Frutiger., DOB 01/28/1956, MRN [id number] PCP: Tally Joe, MD  [facility] Providers Cardiologist:  Yates Decamp, MD    History of Present Illness:   Discussed the use of AI scribe software for clinical note transcription with the patient, who gave verbal consent to proceed.  Travis Fletcher Travis Fletcher. "Travis Fletcher" is a 68 year old male with coronary artery disease, prior stenting to mid LAD in 2017, balloon angioplasty to ramus intermediate 2016, and mid LCX PCI 2012, DM type I, hypertension, statin intolerance who presents for cardiology follow-up regarding chest discomfort.   He has a history of recurrent angina and has undergone both repeat stress test, echocardiogram, and LHC since 2017 without significant changes noted to coronary anatomy. Was started on Ranexa and for a time had improvement in symptoms. He has recently experienced  exertional chest pain described as a pressure radiating down his right arm, neck, and back, relieved by rest and nitroglycerin, though not immediately. The pain has a severity of 7-8 out of 10 during exertion and is similar to previous episodes when he had blockages. He has taken nitroglycerin 3-4 times in the last three weeks. He denies having chest pain while at rest, except possibly once. Patient usually does the cooking but has had to stop due to the exertional chest pain.  He notes that he has had COVID-19 three times, with the first instance affecting his heart and requiring cardiac rehab. He reports COVID 5-6 weeks ago, after which he developed persistent fatigue, weakness, and above mentioned exertional chest pain. He experienced fever for a couple of days during his COVID-19 infection.  He has type one diabetes for 42 years, hyperlipidemia, and statin intolerance with myopathy. He is currently on Repatha for hyperlipidemia and takes Ranexa at 500 mg twice daily for chest pain. He has  experienced side effects from isosorbide mononitrate, including headaches. He recently changed his insurance plan, which affected his access to Repatha, but he resumed regular dosing in December 2024.  On exam today, patient is currently without chest pain, shortness of breath.  Studies Reviewed:    EKG:   EKG Interpretation Date/Time:  Friday August 07 2023 11:35:03 EST Ventricular Rate:  76 PR Interval:  174 QRS Duration:  80 QT Interval:  362 QTC Calculation: 407 R Axis:   9  Text Interpretation: Normal sinus rhythm Cannot rule out Inferior infarct , age undetermined When compared with ECG of 26-Jun-2017 08:01, No significant change was found Confirmed by Perlie Gold [phone]) on 08/07/2023 11:35:50 AM   Coronary angiogram 06/26/2017: Normal LV systolic function, no regional wall motion of a mallet he. Widely patent mid LAD stent and mid circumflex stent and balloon angioplasty site to the ramus intermediate. The mid to distal LAD has a focal 60-70% stenosis. Apical LAD has a ulcerated 95-99% stenosis.   CPX 08/01/2019: Exercise testing with gas exchange demonstrates low-normal functional capacity when compared to matched sedentary norms. There are no clear indications for cardiopulmonary limitations. At peak exercise there was normal O2 pulse.  Low normal functional capacity. No evidence of HF limitation. At peak exercise, there was lateral ST depression suggestive of ischemia.   Left Heart Catheterization 08/30/19:   The left ventricular systolic function is normal. LV end diastolic pressure is normal. LV: Normal LV systolic function.  Normal EDP. No change in coronary anatomy since 06/26/2017. Apical LAD ulcerated stenosis has completely healed. Small vessel disease and microvascular  angina. Previous PTCA site widely patent: See below.  LAD stent placed on 06/15/2015 with 3 x 26 mm resolute stent. Ramus intermediate balloon angioplasty on 07/11/2014 and mid circumflex stent with a  4.0 x 15 mm integrity non-DES stent in 2012.      Recommendation: Patient's angina pectoris is related to small vessel disease and microvascular angina pectoris in view of uncontrolled diabetes mellitus.  I have reassured him.  50 mL contrast utilized.   PCV ECHOCARDIOGRAM COMPLETE 06/18/2022  Study Quality: Technically difficult study (suprasternal and subcostal images are sub-optimal). Normal LV systolic function with visual EF 60-65%. No obvious regional wall motion abnormalities. Left ventricle cavity is normal in size. Normal left ventricular wall thickness. Normal global wall motion. Normal diastolic filling pattern, normal LAP. Aortic valve sclerosis without stenosis. Mild (Grade I) mitral regurgitation. Mild tricuspid regurgitation. No evidence of pulmonary hypertension. Compared to 07/20/2019 otherwise no significant change.   PCV MYOCARDIAL PERFUSION WO LEXISCAN 06/16/2022   Narrative Lexiscan (with Mod Bruce protocol) Nuclear stress test 06/16/2022 Myocardial perfusion is normal. Overall LV systolic function is normal without regional wall motion abnormalities. Stress LV EF: 76%. Low risk study. Nondiagnostic ECG stress. The heart rate response was consistent with Regadenoson. The blood pressure response was physiologic. No change from 09/04/2017  Risk Assessment/Calculations:      Physical Exam:   VS:  BP 120/60 (BP Location: Left Arm)   Pulse 76   Ht 5\' 10"  (1.778 m)   Wt 213 lb (96.6 kg)   SpO2 95%   BMI 30.56 kg/m    Wt Readings from Last 3 Encounters:  08/07/23 213 lb (96.6 kg)  12/25/22 210 lb 9.6 oz (95.5 kg)  06/26/22 204 lb 12.8 oz (92.9 kg)     Physical Exam Vitals reviewed.  Constitutional:      Appearance: Normal appearance.  HENT:     Head: Normocephalic.  Eyes:     Pupils: Pupils are equal, round, and reactive to light.  Cardiovascular:     Rate and Rhythm: Normal rate and regular rhythm.     Pulses: Normal pulses.     Heart sounds: Normal  heart sounds.  Pulmonary:     Effort: Pulmonary effort is normal.     Breath sounds: Normal breath sounds.  Abdominal:     General: Abdomen is flat.     Palpations: Abdomen is soft.  Musculoskeletal:     Right lower leg: No edema.     Left lower leg: No edema.  Skin:    General: Skin is warm and dry.     Capillary Refill: Capillary refill takes less than 2 seconds.  Neurological:     General: No focal deficit present.     Mental Status: He is alert and oriented to person, place, and time.  Psychiatric:        Mood and Affect: Mood normal.        Behavior: Behavior normal.        Thought Content: Thought content normal.        Judgment: Judgment normal.    ASSESSMENT AND PLAN:       Coronary Artery Disease Stable Angina->accelerating angina Coronary artery disease with previous stents and angioplasties. Presents with recent exertional chest pain, consistent with prior angina, onset after COVID-19 infection five weeks ago. Pain relieved by nitroglycerin and rest. EKG normal, previous catheterizations showed stable disease with microvascular involvement. Differential includes microvascular disease and potential progression of coronary artery disease. Last catheterization in 2021 showed  60% stenosis in the distal LAD. Potential progression of stenosis suspected. If catheterization shows stable disease, a PET stress test may be needed to assess microvascular disease. - Order cardiac catheterization with Dr. Jacinto Halim on Thursday - Increase Ranexa to 1000 mg twice daily - Continue nitroglycerin as needed - Continue daily aspirin - Continue Metoprolol - Order pre-catheterization labs including kidney function and LDL  Hypertension BP well controlled. - Continue Metoprolol 25mg  BID and Losartan 12.5mg  daily.  Hyperlipidemia Hyperlipidemia managed with Repatha due to statin intolerance. Issues with medication access last year but now on a regular dosing schedule. - Check LDL levels as  part of pre-catheterization labs  Type 1 Diabetes Mellitus Long-standing type 1 diabetes managed with an insulin pump. No acute issues discussed during this visit. - No changes to current management  Follow-up - Schedule cardiac catheterization with Dr. Jacinto Halim on Thursday - Follow-up with cardiology post-catheterization - Advise to go to the emergency department if symptoms worsen before the scheduled catheterization.         Informed Consent   Shared Decision Making/Informed Consent The risks [stroke (1 in 1000), death (1 in 1000), kidney failure [usually temporary] (1 in 500), bleeding (1 in 200), allergic reaction [possibly serious] (1 in 200)], benefits (diagnostic support and management of coronary artery disease) and alternatives of a cardiac catheterization were discussed in detail with Mr. Michelli and he is willing to proceed.      Signed, Perlie Gold, PA-C

## 2023-08-09 NOTE — Addendum Note (Signed)
 Addended by: Perlie Gold on: 08/09/2023 09:08 PM   Modules accepted: Orders

## 2023-08-10 LAB — CBC

## 2023-08-11 ENCOUNTER — Telehealth: Payer: Self-pay | Admitting: *Deleted

## 2023-08-11 LAB — CBC
Hematocrit: 45.9 % (ref 37.5–51.0)
Hemoglobin: 15.7 g/dL (ref 13.0–17.7)
MCH: 33.1 pg — ABNORMAL HIGH (ref 26.6–33.0)
MCHC: 34.2 g/dL (ref 31.5–35.7)
MCV: 97 fL (ref 79–97)
Platelets: 229 10*3/uL (ref 150–450)
RBC: 4.75 x10E6/uL (ref 4.14–5.80)
RDW: 12.1 % (ref 11.6–15.4)
WBC: 6.2 10*3/uL (ref 3.4–10.8)

## 2023-08-11 LAB — BASIC METABOLIC PANEL
BUN/Creatinine Ratio: 15 (ref 10–24)
BUN: 18 mg/dL (ref 8–27)
CO2: 24 mmol/L (ref 20–29)
Calcium: 9.4 mg/dL (ref 8.6–10.2)
Chloride: 101 mmol/L (ref 96–106)
Creatinine, Ser: 1.18 mg/dL (ref 0.76–1.27)
Glucose: 176 mg/dL — ABNORMAL HIGH (ref 70–99)
Potassium: 4.8 mmol/L (ref 3.5–5.2)
Sodium: 137 mmol/L (ref 134–144)
eGFR: 68 mL/min/{1.73_m2} (ref >59)

## 2023-08-11 LAB — LDL CHOLESTEROL, DIRECT: LDL Direct: 63 mg/dL (ref 0–99)

## 2023-08-11 NOTE — Telephone Encounter (Signed)
 Cardiac Catheterization scheduled at [facility] for: Thursday August 13, 2023 7:30 AM Arrival time [facility] Main Entrance A at: 5:30 AM  Nothing to eat after midnight prior to procedure, clear liquids until 5 AM day of procedure.  Medication instructions: -Hold:  Insulin pump-pt will manage -Other usual morning medications can be taken with sips of water including aspirin 81 mg.  Plan to go home the same day, you will only stay overnight if medically necessary.  You must have responsible adult to drive you home.  Someone must be with you the first 24 hours after you arrive home.  Reviewed procedure instructions with patient.

## 2023-08-13 ENCOUNTER — Other Ambulatory Visit: Payer: Self-pay

## 2023-08-13 ENCOUNTER — Ambulatory Visit (HOSPITAL_COMMUNITY)
Admission: RE | Admit: 2023-08-13 | Discharge: 2023-08-13 | Disposition: A | Discharge status: Home / Self Care | Payer: Medicare Other | Attending: Cardiology | Admitting: Cardiology

## 2023-08-13 ENCOUNTER — Encounter (HOSPITAL_COMMUNITY)
Admission: RE | Disposition: A | Discharge status: Home / Self Care | Payer: Self-pay | Source: Home / Self Care | Attending: Cardiology

## 2023-08-13 DIAGNOSIS — E109 Type 1 diabetes mellitus without complications: Secondary | ICD-10-CM | POA: Diagnosis not present

## 2023-08-13 DIAGNOSIS — I10 Essential (primary) hypertension: Secondary | ICD-10-CM | POA: Diagnosis not present

## 2023-08-13 DIAGNOSIS — Z794 Long term (current) use of insulin: Secondary | ICD-10-CM | POA: Insufficient documentation

## 2023-08-13 DIAGNOSIS — Z7982 Long term (current) use of aspirin: Secondary | ICD-10-CM | POA: Diagnosis not present

## 2023-08-13 DIAGNOSIS — I2510 Atherosclerotic heart disease of native coronary artery without angina pectoris: Secondary | ICD-10-CM

## 2023-08-13 DIAGNOSIS — Z955 Presence of coronary angioplasty implant and graft: Secondary | ICD-10-CM | POA: Insufficient documentation

## 2023-08-13 DIAGNOSIS — I200 Unstable angina: Secondary | ICD-10-CM | POA: Diagnosis present

## 2023-08-13 DIAGNOSIS — I25110 Atherosclerotic heart disease of native coronary artery with unstable angina pectoris: Secondary | ICD-10-CM | POA: Diagnosis not present

## 2023-08-13 DIAGNOSIS — Z79899 Other long term (current) drug therapy: Secondary | ICD-10-CM | POA: Insufficient documentation

## 2023-08-13 DIAGNOSIS — Z8616 Personal history of COVID-19: Secondary | ICD-10-CM | POA: Insufficient documentation

## 2023-08-13 HISTORY — PX: LEFT HEART CATH AND CORONARY ANGIOGRAPHY: CATH118249

## 2023-08-13 LAB — GLUCOSE, CAPILLARY
Glucose-Capillary: 196 mg/dL — ABNORMAL HIGH (ref 70–99)
Glucose-Capillary: 158 mg/dL — ABNORMAL HIGH (ref 70–99)

## 2023-08-13 SURGERY — LEFT HEART CATH AND CORONARY ANGIOGRAPHY
Anesthesia: LOCAL

## 2023-08-13 MED ORDER — IOHEXOL 350 MG/ML SOLN
INTRAVENOUS | Status: DC | PRN
  Administered 2023-08-13: 13 mL

## 2023-08-13 MED ORDER — HEPARIN SODIUM (PORCINE) 1000 UNIT/ML IJ SOLN
INTRAMUSCULAR | Status: DC | PRN
  Administered 2023-08-13: 4500 [IU] via INTRAVENOUS

## 2023-08-13 MED ORDER — ACETAMINOPHEN 325 MG PO TABS: 650.0000 mg | ORAL_TABLET | ORAL | Status: DC | PRN

## 2023-08-13 MED ORDER — MIDAZOLAM HCL 2 MG/2ML IJ SOLN
INTRAMUSCULAR | Status: DC | PRN
  Administered 2023-08-13: 2 mg via INTRAVENOUS

## 2023-08-13 MED ORDER — VERAPAMIL HCL 2.5 MG/ML IV SOLN
INTRAVENOUS | Status: AC
Start: 2023-08-13 — End: ?
  Filled 2023-08-13: qty 2

## 2023-08-13 MED ORDER — SODIUM CHLORIDE 0.9% FLUSH: 3.0000 mL | INTRAVENOUS | Status: DC | PRN

## 2023-08-13 MED ORDER — MIDAZOLAM HCL 2 MG/2ML IJ SOLN
INTRAMUSCULAR | Status: AC
  Filled 2023-08-13: qty 2

## 2023-08-13 MED ORDER — ONDANSETRON HCL 4 MG/2ML IJ SOLN: 4.0000 mg | Freq: Four times a day (QID) | INTRAMUSCULAR | Status: DC | PRN

## 2023-08-13 MED ORDER — HEPARIN (PORCINE) IN NACL 1000-0.9 UT/500ML-% IV SOLN
INTRAVENOUS | Status: DC | PRN
  Administered 2023-08-13 (×2): 500 mL

## 2023-08-13 MED ORDER — SODIUM CHLORIDE 0.9 % IV SOLN: 250.0000 mL | INTRAVENOUS | Status: DC | PRN

## 2023-08-13 MED ORDER — FENTANYL CITRATE (PF) 100 MCG/2ML IJ SOLN
INTRAMUSCULAR | Status: DC | PRN
  Administered 2023-08-13: 25 ug via INTRAVENOUS

## 2023-08-13 MED ORDER — SODIUM CHLORIDE 0.9 % WEIGHT BASED INFUSION
3.0000 mL/kg/h | INTRAVENOUS | Status: AC
Start: 2023-08-13 — End: 2023-08-13

## 2023-08-13 MED ORDER — ASPIRIN 81 MG PO CHEW: 81.0000 mg | CHEWABLE_TABLET | ORAL | Status: AC

## 2023-08-13 MED ORDER — LIDOCAINE HCL (PF) 1 % IJ SOLN
INTRAMUSCULAR | Status: DC | PRN
  Administered 2023-08-13: 2 mL via INTRADERMAL

## 2023-08-13 MED ORDER — VERAPAMIL HCL 2.5 MG/ML IV SOLN
INTRAVENOUS | Status: DC | PRN
  Administered 2023-08-13: 10 mL via INTRA_ARTERIAL

## 2023-08-13 MED ORDER — SODIUM CHLORIDE 0.9 % WEIGHT BASED INFUSION: 1.0000 mL/kg/h | INTRAVENOUS | Status: DC

## 2023-08-13 MED ORDER — LIDOCAINE HCL (PF) 1 % IJ SOLN
INTRAMUSCULAR | Status: AC
  Filled 2023-08-13: qty 30

## 2023-08-13 MED ORDER — HEPARIN SODIUM (PORCINE) 1000 UNIT/ML IJ SOLN
INTRAMUSCULAR | Status: AC
Start: 2023-08-13 — End: ?
  Filled 2023-08-13: qty 10

## 2023-08-13 MED ORDER — FENTANYL CITRATE (PF) 100 MCG/2ML IJ SOLN
INTRAMUSCULAR | Status: AC
  Filled 2023-08-13: qty 2

## 2023-08-13 SURGICAL SUPPLY — 9 items
CATH INFINITI AMBI 5FR TG (CATHETERS) IMPLANT
DEVICE RAD COMP TR BAND LRG (VASCULAR PRODUCTS) IMPLANT
GLIDESHEATH SLEND SS 6F .021 (SHEATH) IMPLANT
GUIDEWIRE INQWIRE 1.5J.035X260 (WIRE) IMPLANT
INQWIRE 1.5J .035X260CM (WIRE) ×1 IMPLANT
KIT SINGLE USE MANIFOLD (KITS) IMPLANT
PACK CARDIAC CATHETERIZATION (CUSTOM PROCEDURE TRAY) ×1 IMPLANT
SET ATX-X65L (MISCELLANEOUS) IMPLANT
SHEATH PROBE COVER 6X72 (BAG) IMPLANT

## 2023-08-13 NOTE — Interval H&P Note (Signed)
 History and Physical Interval Note:  08/13/2023 7:44 AM  Travis Fletcher.  has presented today for surgery, with the diagnosis of accerlated angina.  The various methods of treatment have been discussed with the patient and family. After consideration of risks, benefits and other options for treatment, the patient has consented to  Procedure(s): LEFT HEART CATH AND CORONARY ANGIOGRAPHY (N/A) and possible coronary angioplasty as a surgical intervention.  The patient's history has been reviewed, patient examined, no change in status, stable for surgery.  I have reviewed the patient's chart and labs.  Questions were answered to the patient's satisfaction.     Yates Decamp

## 2023-08-14 ENCOUNTER — Encounter: Payer: Self-pay | Admitting: Cardiology

## 2023-08-14 ENCOUNTER — Telehealth: Payer: Self-pay

## 2023-08-14 ENCOUNTER — Encounter (HOSPITAL_COMMUNITY): Payer: Self-pay | Admitting: Cardiology

## 2023-08-14 DIAGNOSIS — E7800 Pure hypercholesterolemia, unspecified: Secondary | ICD-10-CM

## 2023-08-14 NOTE — Telephone Encounter (Signed)
 Spoke with pt regarding his lab results. Pt agreed to come to [facility] in 3 months for a fasting lipid panel. Pt verbalized understanding. FLP was ordered and released. Pt mentioned that he saw Perlie Gold, PA on 2/28 and that he was supposed to come back in one month to see him again, however no appointments were available and the pt said he was sent home and told that someone would reach out to him to schedule something, but no one called him. The pt was offered an appointment at another [facility] location as we do not have anything available in a month with an APP here. Pt is unwilling to see someone else and wants to wait to see Perlie Gold, PA. Pt's request will be forwarded to scheduling.

## 2023-08-14 NOTE — Telephone Encounter (Signed)
-----   Message from Perlie Gold sent at 08/12/2023  4:32 PM EST ----- Pre-catheterization labs are reassuring. LDL of 63 is slightly above recommended level of 55. Given recent difficulties with access to Repatha, would just recommend that we monitor closely, repeat a fasting lipid panel in 3 months.

## 2023-10-03 ENCOUNTER — Other Ambulatory Visit: Payer: Self-pay | Admitting: Cardiology

## 2023-12-10 ENCOUNTER — Ambulatory Visit
Admission: RE | Admit: 2023-12-10 | Discharge: 2023-12-10 | Disposition: A | Discharge status: Home / Self Care | Source: Ambulatory Visit | Attending: Emergency Medicine | Admitting: Emergency Medicine

## 2023-12-10 VITALS — BP 127/70 | HR 90 | Temp 97.6°F | Resp 18

## 2023-12-10 DIAGNOSIS — J069 Acute upper respiratory infection, unspecified: Secondary | ICD-10-CM | POA: Diagnosis not present

## 2023-12-10 DIAGNOSIS — J40 Bronchitis, not specified as acute or chronic: Secondary | ICD-10-CM | POA: Diagnosis not present

## 2023-12-10 DIAGNOSIS — J329 Chronic sinusitis, unspecified: Secondary | ICD-10-CM

## 2023-12-10 MED ORDER — ALBUTEROL SULFATE HFA 108 (90 BASE) MCG/ACT IN AERS: 1.0000 | INHALATION_SPRAY | Freq: Four times a day (QID) | RESPIRATORY_TRACT | 0 refills | Status: AC | PRN

## 2023-12-10 MED ORDER — AMOXICILLIN-POT CLAVULANATE 875-125 MG PO TABS: 1.0000 | ORAL_TABLET | Freq: Two times a day (BID) | ORAL | 0 refills | Status: DC

## 2023-12-10 NOTE — ED Provider Notes (Signed)
 Travis Fletcher    CSN: [id number] Arrival date & time: 12/10/23  1245      History   Chief Complaint Chief Complaint  Patient presents with   Cough    Virus going into sinus infection and trouble breathing when laying down - Entered by patient    HPI Travis Fletcher. is a 68 y.o. male.  Patient presents with 5-6 day history of congestion, sinus pressure, cough.  He woke up last night with wheezing and shortness of breath.  He attempted to use his albuterol inhaler but noted that it had expired in 2022.  He denies fever, chills, chest pain.  Patient was seen by his PCP on 12/07/2023 for viral URI.  The history is provided by the patient and medical records.    Past Medical History:  Diagnosis Date   Anginal pain (HCC)    Arthritis    hands (07/11/2014)   Asthma    mild   CAD (coronary artery disease), native coronary artery 07/24/2018   Coronary artery disease    H/O pneumothorax    spontaneous, when patient was 68 years old   High cholesterol    History of COVID-19 06/2019   x 2; 02/2021   History of kidney stones    Hypercholesteremia 07/24/2018   Kidney stones    passed them all   Neuromuscular disorder (HCC)    neuropathy   Prostate cancer (HCC) 2010   Statin intolerance 07/24/2018   Type 1 diabetes mellitus with other specified complication [facility])     Patient Active Problem List   Diagnosis Date Noted   Uncontrolled type 1 diabetes mellitus with hyperglycemia (HCC) 05/23/2022   Nonrheumatic mitral valve regurgitation 05/23/2022   Cervical disc herniation 07/17/2021   History of prostate cancer 03/22/2020   Essential hypertension 03/22/2020   Type 1 diabetes mellitus with complications (HCC) 03/22/2020   Angina pectoris (HCC)    CAD (coronary artery disease), native coronary artery 07/24/2018   Hypercholesteremia 07/24/2018   Statin intolerance 07/24/2018   Dyspnea 01/29/2018   Post PTCA 07/11/2014   Mild non proliferative diabetic  retinopathy (HCC) 11/28/2012    Past Surgical History:  Procedure Laterality Date   BALLOON ANGIOPLASTY, ARTERY  07/13/2012   CARDIAC CATHETERIZATION N/A 06/15/2015   Procedure: Left Heart Cath and Coronary Angiography;  Surgeon: Gordy Bergamo, MD;  Location: [facility] INVASIVE CV LAB;  Service: Cardiovascular;  Laterality: N/A;   CARDIAC CATHETERIZATION N/A 06/15/2015   Procedure: Coronary Stent Intervention;  Surgeon: Gordy Bergamo, MD;  Location: [facility] INVASIVE CV LAB;  Service: Cardiovascular;  Laterality: N/A;   CATARACT EXTRACTION W/ INTRAOCULAR LENS  IMPLANT, BILATERAL Bilateral ~ 2000   CERVICAL DISC ARTHROPLASTY N/A 07/17/2021   Procedure: CERVICAL ANTERIOR DISC ARTHROPLASTY C5-6;  Surgeon: Burnetta Aures, MD;  Location: [facility] OR;  Service: Orthopedics;  Laterality: N/A;  3 hrs 3 C-Bed   CORONARY ANGIOPLASTY  07/11/2014   CORONARY ANGIOPLASTY WITH STENT PLACEMENT  2012   1   EYE SURGERY     INGUINAL HERNIA REPAIR Left 1987   INSERTION PROSTATE RADIATION SEED  2010   LEFT HEART CATH AND CORONARY ANGIOGRAPHY N/A 06/26/2017   Procedure: LEFT HEART CATH AND CORONARY ANGIOGRAPHY;  Surgeon: Bergamo Gordy, MD;  Location: [facility] INVASIVE CV LAB;  Service: Cardiovascular;  Laterality: N/A;   LEFT HEART CATH AND CORONARY ANGIOGRAPHY N/A 08/30/2019   Procedure: LEFT HEART CATH AND CORONARY ANGIOGRAPHY;  Surgeon: Bergamo Gordy, MD;  Location: [facility] INVASIVE CV LAB;  Service: Cardiovascular;  Laterality: N/A;   LEFT HEART CATH AND CORONARY ANGIOGRAPHY N/A 08/13/2023   Procedure: LEFT HEART CATH AND CORONARY ANGIOGRAPHY;  Surgeon: Ladona Heinz, MD;  Location: [facility] INVASIVE CV LAB;  Service: Cardiovascular;  Laterality: N/A;   LEFT HEART CATHETERIZATION WITH CORONARY ANGIOGRAM N/A 07/13/2012   Procedure: LEFT HEART CATHETERIZATION WITH CORONARY ANGIOGRAM;  Surgeon: Erick JONELLE Ladona, MD;  Location: [facility] CATH LAB;  Service: Cardiovascular;  Laterality: N/A;   LEFT HEART CATHETERIZATION WITH CORONARY ANGIOGRAM N/A 07/11/2014   Procedure: LEFT HEART  CATHETERIZATION WITH CORONARY ANGIOGRAM;  Surgeon: Erick JONELLE Ladona, MD;  Location: [facility] CATH LAB;  Service: Cardiovascular;  Laterality: N/A;   RETINAL DETACHMENT SURGERY Left 2010   RETINAL LASER PROCEDURE Right 2010   THYROIDECTOMY, PARTIAL  ~ 1979   TONSILLECTOMY AND ADENOIDECTOMY  1977       Home Medications    Prior to Admission medications   Medication Sig Start Date End Date Taking? Authorizing Provider  albuterol (VENTOLIN HFA) 108 (90 Base) MCG/ACT inhaler Inhale 1-2 puffs into the lungs every 6 (six) hours as needed. 12/10/23  Yes Corlis Burnard DEL, NP  amoxicillin -clavulanate (AUGMENTIN ) 875-125 MG tablet Take 1 tablet by mouth every 12 (twelve) hours. 12/10/23  Yes Corlis Burnard DEL, NP  aspirin  81 MG tablet Take 1 tablet (81 mg total) by mouth daily. 06/15/15  Yes Ladona Heinz, MD  Evolocumab  (REPATHA  SURECLICK) 140 MG/ML SOAJ Inject 140 mg into the skin every 14 (fourteen) days. 06/12/23  Yes Ladona Heinz, MD  guaiFENesin (MUCINEX) 600 MG 12 hr tablet Take 600 mg by mouth 2 (two) times daily as needed (congestion).   Yes [provider]  insulin  aspart (NOVOLOG ) 100 UNIT/ML injection Inject into the skin See admin instructions. Uses via Insulin  Pump   Yes [provider]  losartan  (COZAAR ) 25 MG tablet TAKE ONE HALF (1/2) TABLET BY MOUTH ONCE A DAY 10/05/23  Yes Ladona Heinz, MD  Melatonin 5 MG CAPS Take 5 mg by mouth at bedtime.   Yes [provider]  metoprolol  tartrate (LOPRESSOR ) 25 MG tablet TAKE ONE TABLET BY MOUTH TWICE (2) DAILY 07/30/23  Yes Ladona Heinz, MD  ranolazine  (RANEXA ) 1000 MG SR tablet Take 1 tablet (1,000 mg total) by mouth 2 (two) times daily. 08/07/23  Yes Williams, Evan, PA-C  acetaminophen  (TYLENOL ) 325 MG tablet Take 650 mg by mouth every 6 (six) hours as needed for moderate pain or headache.    [provider]  nitroGLYCERIN  (NITROSTAT ) 0.4 MG SL tablet Place 1 tablet (0.4 mg total) under the tongue every 5 (five) minutes as needed for chest  pain. 06/12/22   Patwardhan, Newman PARAS, MD  Polyethyl Glycol-Propyl Glycol (SYSTANE OP) Place 1 drop into both eyes daily as needed (dry eyes).    [provider]    Family History Family History  Problem Relation Age of Onset   Stroke Mother 25   Atrial fibrillation Mother    Atrial fibrillation Father    Heart disease Sister    Pancreatic cancer Paternal Uncle    Mesothelioma Paternal Uncle     Social History Social History   Tobacco Use   Smoking status: Never   Smokeless tobacco: Never  Vaping Use   Vaping status: Never Used  Substance Use Topics   Alcohol use: Yes    Comment: 1-2 beers, a few days a week or less   Drug use: No     Allergies   Brilinta  [ticagrelor ] and Statins   Review of Systems  Review of Systems  Constitutional:  Negative for chills and fever.  HENT:  Positive for congestion, postnasal drip and sinus pressure. Negative for ear pain and sore throat.   Respiratory:  Positive for cough, shortness of breath and wheezing.   Cardiovascular:  Negative for chest pain and palpitations.     Physical Exam Triage Vital Signs ED Triage Vitals  Encounter Vitals Group     BP 12/10/23 1251 127/70     Girls Systolic BP Percentile --      Girls Diastolic BP Percentile --      Boys Systolic BP Percentile --      Boys Diastolic BP Percentile --      Pulse Rate 12/10/23 1251 90     Resp 12/10/23 1251 18     Temp 12/10/23 1251 97.6 F (36.4 C)     Temp src --      SpO2 12/10/23 1251 96 %     Weight --      Height --      Head Circumference --      Peak Flow --      Pain Score 12/10/23 1311 3     Pain Loc --      Pain Education --      Exclude from Growth Chart --    No data found.  Updated Vital Signs BP 127/70   Pulse 90   Temp 97.6 F (36.4 C)   Resp 18   SpO2 96%   Visual Acuity Right Eye Distance:   Left Eye Distance:   Bilateral Distance:    Right Eye Near:   Left Eye Near:    Bilateral Near:     Physical  Exam Constitutional:      General: He is not in acute distress. HENT:     Right Ear: Tympanic membrane normal.     Left Ear: Tympanic membrane normal.     Nose: Congestion present.     Mouth/Throat:     Mouth: Mucous membranes are moist.     Pharynx: Oropharynx is clear.  Cardiovascular:     Rate and Rhythm: Normal rate and regular rhythm.     Heart sounds: Normal heart sounds.  Pulmonary:     Effort: Pulmonary effort is normal. No respiratory distress.     Breath sounds: Normal breath sounds. No wheezing.  Neurological:     Mental Status: He is alert.      UC Treatments / Results  Labs (all labs ordered are listed, but only abnormal results are displayed) Labs Reviewed - No data to display  EKG   Radiology No results found.  Procedures Procedures (including critical care time)  Medications Ordered in UC Medications - No data to display  Initial Impression / Assessment and Plan / UC Course  I have reviewed the triage vital signs and the nursing notes.  Pertinent labs & imaging results that were available during my care of the patient were reviewed by me and considered in my medical decision making (see chart for details).    Acute upper respiratory infection, sinobronchitis.  Afebrile and vital signs are stable.  Lungs are clear and O2 sat is 96% on room air.  Patient declines chest x-ray.  He also declines prednisone as he is type I diabetic.  Treating today with albuterol inhaler and Augmentin .  Instructed patient to follow-up with his PCP on Monday.  ED precautions given.  Education provided on upper respiratory infection.  Patient agrees to plan of care.  Final Clinical Impressions(s) / UC Diagnoses   Final diagnoses:  Acute upper respiratory infection  Sinobronchitis     Discharge Instructions      Take the Augmentin  and use the albuterol inhaler as directed.  Follow up with your primary care provider on Monday.  Go to the emergency department if you  have worsening symptoms.        ED Prescriptions     Medication Sig Dispense Auth. Provider   albuterol (VENTOLIN HFA) 108 (90 Base) MCG/ACT inhaler Inhale 1-2 puffs into the lungs every 6 (six) hours as needed. 18 g Corlis Burnard DEL, NP   amoxicillin -clavulanate (AUGMENTIN ) 875-125 MG tablet Take 1 tablet by mouth every 12 (twelve) hours. 14 tablet Corlis Burnard DEL, NP      PDMP not reviewed this encounter.   Corlis Burnard DEL, NP 12/10/23 1335

## 2023-12-10 NOTE — ED Triage Notes (Signed)
 Cough, congestion, sinus pressure and pain x 5 days. Pt states he was wheezing last night when trying to sleep.  Taking benadryl and mucinex.  Was tested for covid, rsv and flu all were negative on Monday.

## 2023-12-10 NOTE — Discharge Instructions (Addendum)
 Take the Augmentin  and use the albuterol inhaler as directed.  Follow up with your primary care provider on Monday.  Go to the emergency department if you have worsening symptoms.

## 2023-12-24 ENCOUNTER — Ambulatory Visit: Payer: Medicare HMO | Admitting: Cardiology

## 2024-01-04 ENCOUNTER — Telehealth: Payer: Self-pay

## 2024-01-04 ENCOUNTER — Other Ambulatory Visit (HOSPITAL_COMMUNITY): Payer: Self-pay

## 2024-01-04 ENCOUNTER — Other Ambulatory Visit: Payer: Self-pay | Admitting: Cardiology

## 2024-01-04 NOTE — Telephone Encounter (Signed)
 Pharmacy Patient Advocate Encounter  Received notification from [employer] that Prior Authorization for REPATHA  has been APPROVED from 01/04/24 to 06/08/24. Ran test claim, Copay is $47. This test claim was processed through [facility]- copay amounts may vary at other pharmacies due to pharmacy/plan contracts, or as the patient moves through the different stages of their insurance plan.

## 2024-01-04 NOTE — Telephone Encounter (Signed)
 Pharmacy Patient Advocate Encounter   Received notification from CoverMyMeds that prior authorization for REPATHA  is required/requested.   Insurance verification completed.   The patient is insured through [facility] [employer] .   Per test claim: PA required; PA submitted to above mentioned insurance via CoverMyMeds Key/confirmation #/EOC [id number] Status is pending

## 2024-01-11 ENCOUNTER — Other Ambulatory Visit: Payer: Self-pay | Admitting: Cardiology

## 2024-01-13 ENCOUNTER — Ambulatory Visit: Payer: Self-pay

## 2024-01-13 ENCOUNTER — Telehealth: Payer: Self-pay | Admitting: Emergency Medicine

## 2024-01-13 ENCOUNTER — Ambulatory Visit (INDEPENDENT_AMBULATORY_CARE_PROVIDER_SITE_OTHER)

## 2024-01-13 ENCOUNTER — Ambulatory Visit
Admission: RE | Admit: 2024-01-13 | Discharge: 2024-01-13 | Disposition: A | Discharge status: Home / Self Care | Source: Ambulatory Visit | Attending: Emergency Medicine | Admitting: Emergency Medicine

## 2024-01-13 VITALS — BP 128/80 | HR 90 | Temp 98.2°F | Resp 18

## 2024-01-13 DIAGNOSIS — J069 Acute upper respiratory infection, unspecified: Secondary | ICD-10-CM

## 2024-01-13 MED ORDER — HYDROCOD POLI-CHLORPHE POLI ER 10-8 MG/5ML PO SUER: 5.0000 mL | Freq: Every evening | ORAL | 0 refills | Status: DC | PRN

## 2024-01-13 MED ORDER — AMOXICILLIN-POT CLAVULANATE 875-125 MG PO TABS: 1.0000 | ORAL_TABLET | Freq: Two times a day (BID) | ORAL | 0 refills | Status: AC

## 2024-01-13 NOTE — Telephone Encounter (Signed)
 Reported x-ray results via telephone #2 patient identifiers use, no change in treatment plan for persisting symptoms he is to follow-up with his PCP for worsening symptoms he is to go to the nearest emergency department, verbalized understanding

## 2024-01-13 NOTE — ED Triage Notes (Signed)
 Patient reports that he was treated on December 10, 2023 at [facility] for sinus infection. Patient was placed on Augmentin  . Patient felt better after taking antibiotics. Patient states cough has never went away but got worse within last 4 days. Cough is productive with yellow sputum and has sinus head ache. Rates head ache 1/10. Patient took Zyrtec last night with no relief.

## 2024-01-13 NOTE — ED Provider Notes (Signed)
 [facility]    CSN: [id number] Arrival date & time: 01/13/24  1500      History   Chief Complaint Chief Complaint  Patient presents with   Cough    Probable sinus infection - Entered by patient   Headache    HPI Travis Fletcher. is a 68 y.o. male.   Patient presents for evaluation of a persistent cough present for 5 weeks, worsening over the last 4 days, has begun to experience chest congestion, a sinus headache to the right side, wheezing with cough and shortness of breath with exertion.  No known sick contacts.  On evaluation on 01/06/2024, prescribed Augmentin  and symptoms did improve but did not fully go away.  Additionally has taken Mucinex, Delsym, Zyrtec, Tessalon without improvement.  History of asthma.  Past Medical History:  Diagnosis Date   Anginal pain (HCC)    Arthritis    hands (07/11/2014)   Asthma    mild   CAD (coronary artery disease), native coronary artery 07/24/2018   Coronary artery disease    H/O pneumothorax    spontaneous, when patient was 68 years old   High cholesterol    History of COVID-19 06/2019   x 2; 02/2021   History of kidney stones    Hypercholesteremia 07/24/2018   Kidney stones    passed them all   Neuromuscular disorder (HCC)    neuropathy   Prostate cancer (HCC) 2010   Statin intolerance 07/24/2018   Type 1 diabetes mellitus with other specified complication [facility])     Patient Active Problem List   Diagnosis Date Noted   Uncontrolled type 1 diabetes mellitus with hyperglycemia (HCC) 05/23/2022   Nonrheumatic mitral valve regurgitation 05/23/2022   Cervical disc herniation 07/17/2021   History of prostate cancer 03/22/2020   Essential hypertension 03/22/2020   Type 1 diabetes mellitus with complications (HCC) 03/22/2020   Angina pectoris (HCC)    CAD (coronary artery disease), native coronary artery 07/24/2018   Hypercholesteremia 07/24/2018   Statin intolerance 07/24/2018   Dyspnea 01/29/2018    Post PTCA 07/11/2014   Mild non proliferative diabetic retinopathy (HCC) 11/28/2012    Past Surgical History:  Procedure Laterality Date   BALLOON ANGIOPLASTY, ARTERY  07/13/2012   CARDIAC CATHETERIZATION N/A 06/15/2015   Procedure: Left Heart Cath and Coronary Angiography;  Surgeon: Gordy Bergamo, MD;  Location: [facility] INVASIVE CV LAB;  Service: Cardiovascular;  Laterality: N/A;   CARDIAC CATHETERIZATION N/A 06/15/2015   Procedure: Coronary Stent Intervention;  Surgeon: Gordy Bergamo, MD;  Location: [facility] INVASIVE CV LAB;  Service: Cardiovascular;  Laterality: N/A;   CATARACT EXTRACTION W/ INTRAOCULAR LENS  IMPLANT, BILATERAL Bilateral ~ 2000   CERVICAL DISC ARTHROPLASTY N/A 07/17/2021   Procedure: CERVICAL ANTERIOR DISC ARTHROPLASTY C5-6;  Surgeon: Burnetta Aures, MD;  Location: [facility] OR;  Service: Orthopedics;  Laterality: N/A;  3 hrs 3 C-Bed   CORONARY ANGIOPLASTY  07/11/2014   CORONARY ANGIOPLASTY WITH STENT PLACEMENT  2012   1   EYE SURGERY     INGUINAL HERNIA REPAIR Left 1987   INSERTION PROSTATE RADIATION SEED  2010   LEFT HEART CATH AND CORONARY ANGIOGRAPHY N/A 06/26/2017   Procedure: LEFT HEART CATH AND CORONARY ANGIOGRAPHY;  Surgeon: Bergamo Gordy, MD;  Location: [facility] INVASIVE CV LAB;  Service: Cardiovascular;  Laterality: N/A;   LEFT HEART CATH AND CORONARY ANGIOGRAPHY N/A 08/30/2019   Procedure: LEFT HEART CATH AND CORONARY ANGIOGRAPHY;  Surgeon: Bergamo Gordy, MD;  Location: [facility] INVASIVE CV LAB;  Service:  Cardiovascular;  Laterality: N/A;   LEFT HEART CATH AND CORONARY ANGIOGRAPHY N/A 08/13/2023   Procedure: LEFT HEART CATH AND CORONARY ANGIOGRAPHY;  Surgeon: Ladona Heinz, MD;  Location: [facility] INVASIVE CV LAB;  Service: Cardiovascular;  Laterality: N/A;   LEFT HEART CATHETERIZATION WITH CORONARY ANGIOGRAM N/A 07/13/2012   Procedure: LEFT HEART CATHETERIZATION WITH CORONARY ANGIOGRAM;  Surgeon: Erick JONELLE Ladona, MD;  Location: [facility] CATH LAB;  Service: Cardiovascular;  Laterality: N/A;   LEFT HEART CATHETERIZATION WITH  CORONARY ANGIOGRAM N/A 07/11/2014   Procedure: LEFT HEART CATHETERIZATION WITH CORONARY ANGIOGRAM;  Surgeon: Erick JONELLE Ladona, MD;  Location: [facility] CATH LAB;  Service: Cardiovascular;  Laterality: N/A;   RETINAL DETACHMENT SURGERY Left 2010   RETINAL LASER PROCEDURE Right 2010   THYROIDECTOMY, PARTIAL  ~ 1979   TONSILLECTOMY AND ADENOIDECTOMY  1977       Home Medications    Prior to Admission medications   Medication Sig Start Date End Date Taking? Authorizing Provider  amoxicillin -clavulanate (AUGMENTIN ) 875-125 MG tablet Take 1 tablet by mouth every 12 (twelve) hours for 10 days. 01/13/24 01/23/24 Yes Anastasio Wogan R, NP  chlorpheniramine-HYDROcodone  (TUSSIONEX) 10-8 MG/5ML Take 5 mLs by mouth at bedtime as needed for cough. 01/13/24  Yes Patricio Popwell R, NP  acetaminophen  (TYLENOL ) 325 MG tablet Take 650 mg by mouth every 6 (six) hours as needed for moderate pain or headache.    [provider]  albuterol  (VENTOLIN  HFA) 108 (90 Base) MCG/ACT inhaler Inhale 1-2 puffs into the lungs every 6 (six) hours as needed. 12/10/23   Corlis Burnard DEL, NP  aspirin  81 MG tablet Take 1 tablet (81 mg total) by mouth daily. 06/15/15   Ladona Heinz, MD  Evolocumab  (REPATHA  SURECLICK) 140 MG/ML SOAJ Inject 140 mg into the skin every 14 (fourteen) days. 06/12/23   Ladona Heinz, MD  guaiFENesin (MUCINEX) 600 MG 12 hr tablet Take 600 mg by mouth 2 (two) times daily as needed (congestion).    [provider]  insulin  aspart (NOVOLOG ) 100 UNIT/ML injection Inject into the skin See admin instructions. Uses via Insulin  Pump    [provider]  losartan  (COZAAR ) 25 MG tablet TAKE ONE HALF (1/2) TABLET BY MOUTH ONCE A DAY 10/05/23   Ladona Heinz, MD  Melatonin 5 MG CAPS Take 5 mg by mouth at bedtime.    [provider]  metoprolol  tartrate (LOPRESSOR ) 25 MG tablet TAKE ONE TABLET BY MOUTH TWICE (2) DAILY 07/30/23   Ladona Heinz, MD  nitroGLYCERIN  (NITROSTAT ) 0.4 MG SL tablet Place 1 tablet (0.4 mg  total) under the tongue every 5 (five) minutes as needed for chest pain. 06/12/22   Patwardhan, Newman PARAS, MD  Polyethyl Glycol-Propyl Glycol (SYSTANE OP) Place 1 drop into both eyes daily as needed (dry eyes).    [provider]  ranolazine  (RANEXA ) 1000 MG SR tablet Take 1 tablet (1,000 mg total) by mouth 2 (two) times daily. 08/07/23   Trudy Birmingham, PA-C  ranolazine  (RANEXA ) 500 MG 12 hr tablet TAKE ONE TABLET BY MOUTH TWICE A DAY 01/11/24   Ladona Heinz, MD    Family History Family History  Problem Relation Age of Onset   Stroke Mother 2   Atrial fibrillation Mother    Atrial fibrillation Father    Heart disease Sister    Pancreatic cancer Paternal Uncle    Mesothelioma Paternal Uncle     Social History Social History   Tobacco Use   Smoking status: Never   Smokeless tobacco: Never  Vaping  Use   Vaping status: Never Used  Substance Use Topics   Alcohol use: Yes    Comment: 1-2 beers, a few days a week or less   Drug use: No     Allergies   Brilinta  [ticagrelor ] and Statins   Review of Systems Review of Systems   Physical Exam Triage Vital Signs ED Triage Vitals  Encounter Vitals Group     BP 01/13/24 1516 128/80     Girls Systolic BP Percentile --      Girls Diastolic BP Percentile --      Boys Systolic BP Percentile --      Boys Diastolic BP Percentile --      Pulse Rate 01/13/24 1516 90     Resp 01/13/24 1516 18     Temp 01/13/24 1516 98.2 F (36.8 C)     Temp Source 01/13/24 1516 Oral     SpO2 01/13/24 1514 98 %     Weight --      Height --      Head Circumference --      Peak Flow --      Pain Score 01/13/24 1513 1     Pain Loc --      Pain Education --      Exclude from Growth Chart --    No data found.  Updated Vital Signs BP 128/80 (BP Location: Right Arm)   Pulse 90   Temp 98.2 F (36.8 C) (Oral)   Resp 18   SpO2 98%   Visual Acuity Right Eye Distance:   Left Eye Distance:   Bilateral Distance:    Right Eye Near:   Left  Eye Near:    Bilateral Near:     Physical Exam Constitutional:      Appearance: Normal appearance.  HENT:     Head: Normocephalic.     Right Ear: Tympanic membrane, ear canal and external ear normal.     Left Ear: Tympanic membrane, ear canal and external ear normal.     Nose: Congestion present.     Mouth/Throat:     Mouth: Mucous membranes are moist.     Pharynx: Oropharynx is clear. No oropharyngeal exudate or posterior oropharyngeal erythema.  Eyes:     Extraocular Movements: Extraocular movements intact.  Cardiovascular:     Rate and Rhythm: Normal rate and regular rhythm.     Pulses: Normal pulses.     Heart sounds: Normal heart sounds.  Pulmonary:     Effort: Pulmonary effort is normal.     Breath sounds: Normal breath sounds.  Musculoskeletal:     Cervical back: Normal range of motion and neck supple.  Neurological:     Mental Status: He is alert and oriented to person, place, and time. Mental status is at baseline.      UC Treatments / Results  Labs (all labs ordered are listed, but only abnormal results are displayed) Labs Reviewed - No data to display  EKG   Radiology No results found.  Procedures Procedures (including critical care time)  Medications Ordered in UC Medications - No data to display  Initial Impression / Assessment and Plan / UC Course  I have reviewed the triage vital signs and the nursing notes.  Pertinent labs & imaging results that were available during my care of the patient were reviewed by me and considered in my medical decision making (see chart for details).  Acute URI  Vitals are stable, patient in no signs of distress  nontoxic-appearing, cough persisting for 5 weeks, now worsening therefore chest x-ray completed, pending, will notify of results via telephone, prescribed Augmentin  and Tussionex, PDMP reviewed, low risk, defer use of steroids due to history of diabetes, patient endorses that is a difficult to reduce blood  sugar once elevated, has albuterol  inhaler available at home, advised to keep with him at all times, recommended nonpharmacological measures and over-the-counter medications with follow-up as needed Final Clinical Impressions(s) / UC Diagnoses   Final diagnoses:  Acute URI     Discharge Instructions      Chest x-ray is pending, you will be notified of results via telephone  Begin Augmentin  twice daily for 10 days, extended from 7 to 3 days to see if this will yield better results  You may use Tussionex at bedtime to allow for rest  You can take Tylenol  and/or Ibuprofen  as needed for fever reduction and pain relief.   For cough: honey 1/2 to 1 teaspoon (you can dilute the honey in water or another fluid).  You can also use guaifenesin and dextromethorphan for cough. You can use a humidifier for chest congestion and cough.  If you don't have a humidifier, you can sit in the bathroom with the hot shower running.      For sore throat: try warm salt water gargles, cepacol lozenges, throat spray, warm tea or water with lemon/honey, popsicles or ice, or OTC cold relief medicine for throat discomfort.   For congestion: take a daily anti-histamine like Zyrtec, Claritin, and a oral decongestant, such as pseudoephedrine.  You can also use Flonase 1-2 sprays in each nostril daily.   It is important to stay hydrated: drink plenty of fluids (water, gatorade/powerade/pedialyte, juices, or teas) to keep your throat moisturized and help further relieve irritation/discomfort.    ED Prescriptions     Medication Sig Dispense Auth. Provider   amoxicillin -clavulanate (AUGMENTIN ) 875-125 MG tablet Take 1 tablet by mouth every 12 (twelve) hours for 10 days. 20 tablet Mertice Uffelman R, NP   chlorpheniramine-HYDROcodone  (TUSSIONEX) 10-8 MG/5ML Take 5 mLs by mouth at bedtime as needed for cough. 115 mL Lovell Nuttall, Shelba SAUNDERS, NP      I have reviewed the PDMP during this encounter.   Teresa Shelba SAUNDERS,  NP 01/13/24 1550

## 2024-01-13 NOTE — Discharge Instructions (Signed)
 Chest x-ray is pending, you will be notified of results via telephone  Begin Augmentin  twice daily for 10 days, extended from 7 to 3 days to see if this will yield better results  You may use Tussionex at bedtime to allow for rest  You can take Tylenol  and/or Ibuprofen  as needed for fever reduction and pain relief.   For cough: honey 1/2 to 1 teaspoon (you can dilute the honey in water or another fluid).  You can also use guaifenesin and dextromethorphan for cough. You can use a humidifier for chest congestion and cough.  If you don't have a humidifier, you can sit in the bathroom with the hot shower running.      For sore throat: try warm salt water gargles, cepacol lozenges, throat spray, warm tea or water with lemon/honey, popsicles or ice, or OTC cold relief medicine for throat discomfort.   For congestion: take a daily anti-histamine like Zyrtec, Claritin, and a oral decongestant, such as pseudoephedrine.  You can also use Flonase 1-2 sprays in each nostril daily.   It is important to stay hydrated: drink plenty of fluids (water, gatorade/powerade/pedialyte, juices, or teas) to keep your throat moisturized and help further relieve irritation/discomfort.

## 2024-02-03 ENCOUNTER — Encounter: Payer: Self-pay | Admitting: Cardiology

## 2024-02-03 ENCOUNTER — Ambulatory Visit: Payer: PRIVATE HEALTH INSURANCE | Attending: Cardiology | Admitting: Cardiology

## 2024-02-03 VITALS — BP 111/65 | HR 91 | Resp 16 | Ht 70.0 in | Wt 217.4 lb

## 2024-02-03 DIAGNOSIS — I25118 Atherosclerotic heart disease of native coronary artery with other forms of angina pectoris: Secondary | ICD-10-CM | POA: Insufficient documentation

## 2024-02-03 DIAGNOSIS — E103393 Type 1 diabetes mellitus with moderate nonproliferative diabetic retinopathy without macular edema, bilateral: Secondary | ICD-10-CM | POA: Insufficient documentation

## 2024-02-03 DIAGNOSIS — E7800 Pure hypercholesterolemia, unspecified: Secondary | ICD-10-CM | POA: Insufficient documentation

## 2024-02-03 NOTE — Patient Instructions (Signed)
 Medication Instructions:  Your physician recommends that you continue on your current medications as directed. Please refer to the Current Medication list given to you today.  *If you need a refill on your cardiac medications before your next appointment, please call your pharmacy*  Lab Work: none If you have labs (blood work) drawn today and your tests are completely normal, you will receive your results only by: MyChart Message (if you have MyChart) OR A paper copy in the mail If you have any lab test that is abnormal or we need to change your treatment, we will call you to review the results.  Testing/Procedures: none  Follow-Up: At [facility], you and your health needs are our priority.  As part of our continuing mission to provide you with exceptional heart care, our providers are all part of one team.  This team includes your primary Cardiologist (physician) and Advanced Practice Providers or APPs (Physician Assistants and Nurse Practitioners) who all work together to provide you with the care you need, when you need it.  Your next appointment:   12 month(s)  Provider:   Dr Berry Bristol    We recommend signing up for the patient portal called "MyChart".  Sign up information is provided on this After Visit Summary.  MyChart is used to connect with patients for Virtual Visits (Telemedicine).  Patients are able to view lab/test results, encounter notes, upcoming appointments, etc.  Non-urgent messages can be sent to your provider as well.   To learn more about what you can do with MyChart, go to [email or web address].   Other Instructions

## 2024-02-03 NOTE — Progress Notes (Signed)
 Cardiology Office Note:  .   Date:  02/03/2024  ID:  Travis Helayne Doroteo Mickey., DOB [birth year], MRN [id number] PCP: Seabron Lenis, MD  [facility] Providers Cardiologist:  Gordy Bergamo, MD   History of Present Illness: .   Travis Lafavor. is a 68 y.o. male with coronary artery disease, DM type I, hypertension, statin intolerance and presently on Repatha   presents for 6 month OV. He has a history of recurrent angina and has undergone both repeat stress test, echocardiogram, and LHC since 2017 without significant changes noted to coronary anatomy.    Presently doing well but recently had pneumonia and took him a while to recuperate from dyspnea and infection.  He has not had any significant chest pain.  Cardiac Studies relevent.    CARDIAC CATHETERIZATION 08/13/2023  Ramus intermediate balloon angioplasty on 07/11/2014. Mid circumflex stent with a 4.0 x 15 mm integrity non-DES stent in 2012 widely patent Mid LAD 3.0 x 26 mm resolute stent placed in 2017 is widely patent. Moderate disease in mid-distal LAD.   Anatomy unchanged from cardiac catheterization dating back to 2021 and 2019     Echocardiogram 06/18/2022: Study Quality: Technically difficult study (suprasternal and subcostal images are sub-optimal). Normal LV systolic function with visual EF 60-65%. No obvious regional wall motion abnormalities. Left ventricle cavity is normal in size. Normal left ventricular wall thickness. Normal global wall motion. Normal diastolic filling pattern, normal LAP.    Discussed the use of AI scribe software for clinical note transcription with the patient, who gave verbal consent to proceed.  History of Present Illness Travis Fletcher. Helayne is a 68 year old male with coronary artery disease and diabetes who presents for a cardiovascular follow-up.  Since January, he has experienced health issues starting with COVID-19, progressing to long COVID. He felt better in  May but relapsed in June. This week, he feels capable of engaging in activities.  His diabetes management has improved with the [facility] system, with A1c levels now consistently at 7 or below, currently at 7.1.  He has coronary artery disease with stents in the LAD and circumflex arteries and a balloon angioplasty on the ramus intermediate. A cardiac catheterization in March showed no change in coronary anatomy since 2021 and 2019. An echocardiogram in January 2024 showed normal heart function.  He is taking losartan  25 mg daily and metoprolol  for heart and diabetes management. He has never had high blood pressure.   Labs   Recent Labs    08/10/23 0711  NA 137  K 4.8  CL 101  CO2 24  GLUCOSE 176*  BUN 18  CREATININE 1.18  CALCIUM  9.4       Latest Ref Rng & Units 08/10/2023    7:11 AM 07/15/2021   10:02 AM 08/19/2019   10:02 AM  CBC  WBC 3.4 - 10.8 x10E3/uL 6.2  8.0  5.7   Hemoglobin 13.0 - 17.7 g/dL 84.2  83.6  83.4   Hematocrit 37.5 - 51.0 % 45.9  47.4  48.9   Platelets 150 - 450 x10E3/uL 229  228  240     Care everywhere/Faxed External Labs:  Labs 12/31/2023  Total cholesterol 141, triglycerides 104, HDL 66, LDL 55.  TSH 2.860  Labs 11/20/23  A1c 7.1%.  ROS  Review of Systems  Cardiovascular:  Negative for chest pain, dyspnea on exertion and leg swelling.   Physical Exam:   VS:  BP 111/65 (BP Location: Left Arm, Patient Position: Sitting,  Cuff Size: Large)   Pulse 91   Resp 16   Ht 5' 10 (1.778 m)   Wt 217 lb 6.4 oz (98.6 kg)   SpO2 94%   BMI 31.19 kg/m    Wt Readings from Last 3 Encounters:  02/03/24 217 lb 6.4 oz (98.6 kg)  08/13/23 205 lb (93 kg)  08/07/23 213 lb (96.6 kg)    BP Readings from Last 3 Encounters:  02/03/24 111/65  01/13/24 128/80  12/10/23 127/70   Physical Exam Constitutional:      Appearance: He is obese.  Neck:     Vascular: No carotid bruit or JVD.  Cardiovascular:     Rate and Rhythm: Normal rate and regular rhythm.      Pulses: Intact distal pulses.     Heart sounds: Normal heart sounds. No murmur heard.    No gallop.  Pulmonary:     Effort: Pulmonary effort is normal.     Breath sounds: Normal breath sounds.  Abdominal:     General: Bowel sounds are normal.     Palpations: Abdomen is soft.  Musculoskeletal:     Right lower leg: No edema.     Left lower leg: No edema.    EKG:         ASSESSMENT AND PLAN: .      ICD-10-CM   1. Coronary artery disease of native artery of native heart with stable angina pectoris (HCC)  I25.118     2. Hypercholesteremia  E78.00     3. Type 1 diabetes mellitus with moderate nonproliferative retinopathy of both eyes without macular edema [facility])  Z89.6606      Assessment & Plan Coronary artery disease with prior stents and angioplasty Coronary artery disease is well-managed with no change in anatomy since evaluations in 2021 and 2019. Stents in the LAD and circumflex, as well as balloon angioplasty to the ramus intermediate, remain patent. Echocardiogram in January 2024 showed normal cardiac function. Stents are expected to last indefinitely as he has not failed by this time.  Type 2 diabetes mellitus with mild nonproliferative diabetic retinopathy, right eye, without macular edema Type 2 diabetes is well-controlled with A1c at 7.1, within the target range of 7 to 8 for patients over 65. Mild nonproliferative diabetic retinopathy in the right eye was treated with laser therapy, showing improvement with no need for additional treatment at this time.  Hyperlipidemia, well controlled on Repatha  Hyperlipidemia is well-controlled with LDL at 55, which is optimal. Repatha  is effective in maintaining cholesterol levels within the desired range. - Continue Repatha    Follow up: 1 Year  Signed,  Gordy Bergamo, MD, [facility] 02/03/2024, 6:09 PM [facility] [address] [place], [place] [address] Phone: [phone]. Fax:  [phone]

## 2024-02-26 LAB — PROTEIN / CREATININE RATIO, URINE
Albumin, U: 0.84
Creatinine, Urine: 154

## 2024-02-26 LAB — MICROALBUMIN / CREATININE URINE RATIO: Microalb Creat Ratio: 5.4

## 2024-04-11 ENCOUNTER — Ambulatory Visit
Admission: RE | Admit: 2024-04-11 | Discharge: 2024-04-11 | Disposition: A | Discharge status: Home / Self Care | Source: Ambulatory Visit | Attending: Emergency Medicine | Admitting: Emergency Medicine

## 2024-04-11 VITALS — BP 100/62 | HR 93 | Temp 97.7°F | Resp 20 | Ht 70.0 in | Wt 207.0 lb

## 2024-04-11 DIAGNOSIS — J0140 Acute pansinusitis, unspecified: Secondary | ICD-10-CM | POA: Diagnosis not present

## 2024-04-11 MED ORDER — AMOXICILLIN-POT CLAVULANATE 875-125 MG PO TABS: 1.0000 | ORAL_TABLET | Freq: Two times a day (BID) | ORAL | 0 refills | Status: DC

## 2024-04-11 MED ORDER — CETIRIZINE HCL 10 MG PO TABS: 10.0000 mg | ORAL_TABLET | Freq: Every day | ORAL | 1 refills | Status: AC

## 2024-04-11 NOTE — Discharge Instructions (Signed)
 Today you are being treated for sinus infection  Begin Augmentin  twice daily for 7 days for treatment of bacteria    You can take Tylenol  and/or Ibuprofen  as needed for fever reduction and pain relief.   For cough: honey 1/2 to 1 teaspoon (you can dilute the honey in water or another fluid).  You can also use guaifenesin and dextromethorphan for cough. You can use a humidifier for chest congestion and cough.  If you don't have a humidifier, you can sit in the bathroom with the hot shower running.      For sore throat: try warm salt water gargles, cepacol lozenges, throat spray, warm tea or water with lemon/honey, popsicles or ice, or OTC cold relief medicine for throat discomfort.   For congestion: take a daily anti-histamine like Zyrtec, Claritin, and a oral decongestant, such as pseudoephedrine.  You can also use Flonase 1-2 sprays in each nostril daily.   It is important to stay hydrated: drink plenty of fluids (water, gatorade/powerade/pedialyte, juices, or teas) to keep your throat moisturized and help further relieve irritation/discomfort.

## 2024-04-11 NOTE — ED Provider Notes (Signed)
 Travis Fletcher    CSN: [id number] Arrival date & time: 04/11/24  1543      History   Chief Complaint Chief Complaint  Patient presents with   Headache    Probable sinus infection - Entered by patient   Facial Pain    HPI Travis Fletcher. is a 68 y.o. male.   Patient presents for evaluation of intermittent headaches, nasal congestion, sinus pressure primarily to the right side present for 3 weeks.  Has ear ringing at baseline which has worsened.  Has attempted use of Mucinex Zyrtec Tylenol  and ibuprofen .  History of reoccurring sinusitis.  Denies fever, cough.  Past Medical History:  Diagnosis Date   Anginal pain    Arthritis    hands (07/11/2014)   Asthma    mild   CAD (coronary artery disease), native coronary artery 07/24/2018   Coronary artery disease    H/O pneumothorax    spontaneous, when patient was 68 years old   High cholesterol    History of COVID-19 06/2019   x 2; 02/2021   History of kidney stones    Hypercholesteremia 07/24/2018   Kidney stones    passed them all   Neuromuscular disorder (HCC)    neuropathy   Prostate cancer (HCC) 2010   Statin intolerance 07/24/2018   Type 1 diabetes mellitus with other specified complication [facility])     Patient Active Problem List   Diagnosis Date Noted   Nonrheumatic mitral valve regurgitation 05/23/2022   Cervical disc herniation 07/17/2021   History of prostate cancer 03/22/2020   Type 1 diabetes mellitus with complications (HCC) 03/22/2020   Angina pectoris    CAD (coronary artery disease), native coronary artery 07/24/2018   Hypercholesteremia 07/24/2018   Statin intolerance 07/24/2018   Dyspnea 01/29/2018   Post PTCA 07/11/2014   Mild non proliferative diabetic retinopathy (HCC) 11/28/2012    Past Surgical History:  Procedure Laterality Date   BALLOON ANGIOPLASTY, ARTERY  07/13/2012   CARDIAC CATHETERIZATION N/A 06/15/2015   Procedure: Left Heart Cath and Coronary Angiography;   Surgeon: Gordy Bergamo, MD;  Location: [facility] INVASIVE CV LAB;  Service: Cardiovascular;  Laterality: N/A;   CARDIAC CATHETERIZATION N/A 06/15/2015   Procedure: Coronary Stent Intervention;  Surgeon: Gordy Bergamo, MD;  Location: [facility] INVASIVE CV LAB;  Service: Cardiovascular;  Laterality: N/A;   CATARACT EXTRACTION W/ INTRAOCULAR LENS  IMPLANT, BILATERAL Bilateral ~ 2000   CERVICAL DISC ARTHROPLASTY N/A 07/17/2021   Procedure: CERVICAL ANTERIOR DISC ARTHROPLASTY C5-6;  Surgeon: Burnetta Aures, MD;  Location: [facility] OR;  Service: Orthopedics;  Laterality: N/A;  3 hrs 3 C-Bed   CORONARY ANGIOPLASTY  07/11/2014   CORONARY ANGIOPLASTY WITH STENT PLACEMENT  2012   1   EYE SURGERY     INGUINAL HERNIA REPAIR Left 1987   INSERTION PROSTATE RADIATION SEED  2010   LEFT HEART CATH AND CORONARY ANGIOGRAPHY N/A 06/26/2017   Procedure: LEFT HEART CATH AND CORONARY ANGIOGRAPHY;  Surgeon: Bergamo Gordy, MD;  Location: [facility] INVASIVE CV LAB;  Service: Cardiovascular;  Laterality: N/A;   LEFT HEART CATH AND CORONARY ANGIOGRAPHY N/A 08/30/2019   Procedure: LEFT HEART CATH AND CORONARY ANGIOGRAPHY;  Surgeon: Bergamo Gordy, MD;  Location: [facility] INVASIVE CV LAB;  Service: Cardiovascular;  Laterality: N/A;   LEFT HEART CATH AND CORONARY ANGIOGRAPHY N/A 08/13/2023   Procedure: LEFT HEART CATH AND CORONARY ANGIOGRAPHY;  Surgeon: Bergamo Gordy, MD;  Location: [facility] INVASIVE CV LAB;  Service: Cardiovascular;  Laterality: N/A;   LEFT HEART CATHETERIZATION  WITH CORONARY ANGIOGRAM N/A 07/13/2012   Procedure: LEFT HEART CATHETERIZATION WITH CORONARY ANGIOGRAM;  Surgeon: Erick JONELLE Bergamo, MD;  Location: [facility] CATH LAB;  Service: Cardiovascular;  Laterality: N/A;   LEFT HEART CATHETERIZATION WITH CORONARY ANGIOGRAM N/A 07/11/2014   Procedure: LEFT HEART CATHETERIZATION WITH CORONARY ANGIOGRAM;  Surgeon: Erick JONELLE Bergamo, MD;  Location: [facility] CATH LAB;  Service: Cardiovascular;  Laterality: N/A;   RETINAL DETACHMENT SURGERY Left 2010   RETINAL LASER PROCEDURE Right 2010    THYROIDECTOMY, PARTIAL  ~ 1979   TONSILLECTOMY AND ADENOIDECTOMY  1977       Home Medications    Prior to Admission medications   Medication Sig Start Date End Date Taking? Authorizing Provider  amoxicillin -clavulanate (AUGMENTIN ) 875-125 MG tablet Take 1 tablet by mouth every 12 (twelve) hours. 04/11/24  Yes Berta Denson, Shelba JONELLE, NP  cetirizine (ZYRTEC ALLERGY) 10 MG tablet Take 1 tablet (10 mg total) by mouth daily. 04/11/24  Yes Zyheir Daft R, NP  acetaminophen  (TYLENOL ) 325 MG tablet Take 650 mg by mouth every 6 (six) hours as needed for moderate pain or headache.    [provider]  albuterol  (VENTOLIN  HFA) 108 (90 Base) MCG/ACT inhaler Inhale 1-2 puffs into the lungs every 6 (six) hours as needed. 12/10/23   Corlis Burnard DEL, NP  aspirin  81 MG tablet Take 1 tablet (81 mg total) by mouth daily. 06/15/15   Bergamo Heinz, MD  Evolocumab  (REPATHA  SURECLICK) 140 MG/ML SOAJ Inject 140 mg into the skin every 14 (fourteen) days. 06/12/23   Bergamo Heinz, MD  guaiFENesin (MUCINEX) 600 MG 12 hr tablet Take 600 mg by mouth 2 (two) times daily as needed (congestion).    [provider]  insulin  aspart (NOVOLOG ) 100 UNIT/ML injection Inject into the skin See admin instructions. Uses via Insulin  Pump    [provider]  losartan  (COZAAR ) 25 MG tablet TAKE ONE HALF (1/2) TABLET BY MOUTH ONCE A DAY 10/05/23   Bergamo Heinz, MD  Melatonin 5 MG CAPS Take 5 mg by mouth at bedtime.    [provider]  metoprolol  tartrate (LOPRESSOR ) 25 MG tablet TAKE ONE TABLET BY MOUTH TWICE (2) DAILY 07/30/23   Bergamo Heinz, MD  nitroGLYCERIN  (NITROSTAT ) 0.4 MG SL tablet Place 1 tablet (0.4 mg total) under the tongue every 5 (five) minutes as needed for chest pain. 06/12/22   Patwardhan, Newman PARAS, MD  Polyethyl Glycol-Propyl Glycol (SYSTANE OP) Place 1 drop into both eyes daily as needed (dry eyes).    [provider]    Family History Family History  Problem Relation Age of Onset   Stroke Mother  51   Atrial fibrillation Mother    Atrial fibrillation Father    Heart disease Sister    Pancreatic cancer Paternal Uncle    Mesothelioma Paternal Uncle     Social History Social History   Tobacco Use   Smoking status: Never   Smokeless tobacco: Never  Vaping Use   Vaping status: Never Used  Substance Use Topics   Alcohol use: Not Currently    Comment: 1-2 beers, a few days a week or less   Drug use: No     Allergies   Brilinta  [ticagrelor ] and Statins   Review of Systems Review of Systems   Physical Exam Triage Vital Signs ED Triage Vitals  Encounter Vitals Group     BP 04/11/24 1608 100/62     Girls Systolic BP Percentile --      Girls Diastolic BP Percentile --  Boys Systolic BP Percentile --      Boys Diastolic BP Percentile --      Pulse Rate 04/11/24 1608 93     Resp 04/11/24 1608 20     Temp 04/11/24 1608 97.7 F (36.5 C)     Temp Source 04/11/24 1608 Oral     SpO2 04/11/24 1608 96 %     Weight 04/11/24 1605 207 lb (93.9 kg)     Height 04/11/24 1605 5' 10 (1.778 m)     Head Circumference --      Peak Flow --      Pain Score 04/11/24 1605 3     Pain Loc --      Pain Education --      Exclude from Growth Chart --    No data found.  Updated Vital Signs BP 100/62 (BP Location: Left Arm)   Pulse 93   Temp 97.7 F (36.5 C) (Oral)   Resp 20   Ht 5' 10 (1.778 m)   Wt 207 lb (93.9 kg)   SpO2 96%   BMI 29.70 kg/m   Visual Acuity Right Eye Distance:   Left Eye Distance:   Bilateral Distance:    Right Eye Near:   Left Eye Near:    Bilateral Near:     Physical Exam Constitutional:      Appearance: Normal appearance.  HENT:     Right Ear: Tympanic membrane, ear canal and external ear normal.     Left Ear: Tympanic membrane, ear canal and external ear normal.     Nose: Congestion present.     Right Sinus: Maxillary sinus tenderness and frontal sinus tenderness present.     Mouth/Throat:     Mouth: Mucous membranes are moist.  Eyes:      Extraocular Movements: Extraocular movements intact.  Pulmonary:     Effort: Pulmonary effort is normal.  Neurological:     Mental Status: He is alert and oriented to person, place, and time. Mental status is at baseline.      UC Treatments / Results  Labs (all labs ordered are listed, but only abnormal results are displayed) Labs Reviewed - No data to display  EKG   Radiology No results found.  Procedures Procedures (including critical care time)  Medications Ordered in UC Medications - No data to display  Initial Impression / Assessment and Plan / UC Course  I have reviewed the triage vital signs and the nursing notes.  Pertinent labs & imaging results that were available during my care of the patient were reviewed by me and considered in my medical decision making (see chart for details).  Acute nonrecurrent pansinusitis  Patient is in no signs of distress nor toxic appearing.  Vital signs are stable.  Low suspicion for pneumonia, pneumothorax or bronchitis and therefore will defer imaging.  Consistent with sinusitis, present for 3 weeks, prescribed Augmentin  which she states is within the past.  Deferring use of steroids due to history of diabetes.May use additional over-the-counter medications as needed for supportive care.  May follow-up with urgent care as needed if symptoms persist or worsen.    Final Clinical Impressions(s) / UC Diagnoses   Final diagnoses:  Acute non-recurrent pansinusitis     Discharge Instructions      Today you are being treated for sinus infection  Begin Augmentin  twice daily for 7 days for treatment of bacteria    You can take Tylenol  and/or Ibuprofen  as needed for fever reduction and pain  relief.   For cough: honey 1/2 to 1 teaspoon (you can dilute the honey in water or another fluid).  You can also use guaifenesin and dextromethorphan for cough. You can use a humidifier for chest congestion and cough.  If you don't have a  humidifier, you can sit in the bathroom with the hot shower running.      For sore throat: try warm salt water gargles, cepacol lozenges, throat spray, warm tea or water with lemon/honey, popsicles or ice, or OTC cold relief medicine for throat discomfort.   For congestion: take a daily anti-histamine like Zyrtec, Claritin, and a oral decongestant, such as pseudoephedrine.  You can also use Flonase 1-2 sprays in each nostril daily.   It is important to stay hydrated: drink plenty of fluids (water, gatorade/powerade/pedialyte, juices, or teas) to keep your throat moisturized and help further relieve irritation/discomfort.    ED Prescriptions     Medication Sig Dispense Auth. Provider   amoxicillin -clavulanate (AUGMENTIN ) 875-125 MG tablet Take 1 tablet by mouth every 12 (twelve) hours. 14 tablet Rayna Brenner R, NP   cetirizine (ZYRTEC ALLERGY) 10 MG tablet Take 1 tablet (10 mg total) by mouth daily. 30 tablet Caleigh Rabelo R, NP      PDMP not reviewed this encounter.   Teresa Shelba SAUNDERS, NP 04/11/24 [phone]

## 2024-04-11 NOTE — ED Triage Notes (Signed)
 Patient states sinus headaches for weeks and facial pressure.  Trying OTC decongestant with little relief.

## 2024-05-27 ENCOUNTER — Ambulatory Visit
Admission: RE | Admit: 2024-05-27 | Discharge: 2024-05-27 | Disposition: A | Discharge status: Home / Self Care | Payer: Self-pay | Source: Ambulatory Visit | Attending: Emergency Medicine | Admitting: Emergency Medicine

## 2024-05-27 VITALS — BP 142/74 | HR 81 | Temp 98.1°F | Resp 20

## 2024-05-27 DIAGNOSIS — J0100 Acute maxillary sinusitis, unspecified: Secondary | ICD-10-CM

## 2024-05-27 MED ORDER — AMOXICILLIN-POT CLAVULANATE 875-125 MG PO TABS: 1.0000 | ORAL_TABLET | Freq: Two times a day (BID) | ORAL | 0 refills | Status: DC

## 2024-05-27 NOTE — ED Provider Notes (Signed)
 " [name]    CSN: [id number] Arrival date & time: 05/27/24  9063      History   Chief Complaint Chief Complaint  Patient presents with   Headache    Probable sinus infection - Entered by patient   Jaw Pain   Nasal Congestion    HPI Jantz Main. is a 68 y.o. male.   Patient presents for evaluation of nasal congestion, sinus pain and pressure to the left jaw, frontal headaches and a nonproductive cough present for 2 weeks.  Cough is productive with green sputum.  Has attempted use of Zyrtec  and ibuprofen  with only minimal relief.  Denies fever, shortness of breath or wheezing.  History of reoccurring sinusitis.   Past Medical History:  Diagnosis Date   Anginal pain    Arthritis    hands (07/11/2014)   Asthma    mild   CAD (coronary artery disease), native coronary artery 07/24/2018   Coronary artery disease    H/O pneumothorax    spontaneous, when patient was 68 years old   High cholesterol    History of COVID-19 06/2019   x 2; 02/2021   History of kidney stones    Hypercholesteremia 07/24/2018   Kidney stones    passed them all   Neuromuscular disorder (HCC)    neuropathy   Prostate cancer (HCC) 2010   Statin intolerance 07/24/2018   Type 1 diabetes mellitus with other specified complication [facility])     Patient Active Problem List   Diagnosis Date Noted   Nonrheumatic mitral valve regurgitation 05/23/2022   Cervical disc herniation 07/17/2021   History of prostate cancer 03/22/2020   Type 1 diabetes mellitus with complications (HCC) 03/22/2020   Angina pectoris    CAD (coronary artery disease), native coronary artery 07/24/2018   Hypercholesteremia 07/24/2018   Statin intolerance 07/24/2018   Dyspnea 01/29/2018   Post PTCA 07/11/2014   Mild non proliferative diabetic retinopathy (HCC) 11/28/2012    Past Surgical History:  Procedure Laterality Date   BALLOON ANGIOPLASTY, ARTERY  07/13/2012   CARDIAC CATHETERIZATION N/A  06/15/2015   Procedure: Left Heart Cath and Coronary Angiography;  Surgeon: Gordy Bergamo, MD;  Location: [facility] INVASIVE CV LAB;  Service: Cardiovascular;  Laterality: N/A;   CARDIAC CATHETERIZATION N/A 06/15/2015   Procedure: Coronary Stent Intervention;  Surgeon: Gordy Bergamo, MD;  Location: [facility] INVASIVE CV LAB;  Service: Cardiovascular;  Laterality: N/A;   CATARACT EXTRACTION W/ INTRAOCULAR LENS  IMPLANT, BILATERAL Bilateral ~ 2000   CERVICAL DISC ARTHROPLASTY N/A 07/17/2021   Procedure: CERVICAL ANTERIOR DISC ARTHROPLASTY C5-6;  Surgeon: Burnetta Aures, MD;  Location: [facility] OR;  Service: Orthopedics;  Laterality: N/A;  3 hrs 3 C-Bed   CORONARY ANGIOPLASTY  07/11/2014   CORONARY ANGIOPLASTY WITH STENT PLACEMENT  2012   1   EYE SURGERY     INGUINAL HERNIA REPAIR Left 1987   INSERTION PROSTATE RADIATION SEED  2010   LEFT HEART CATH AND CORONARY ANGIOGRAPHY N/A 06/26/2017   Procedure: LEFT HEART CATH AND CORONARY ANGIOGRAPHY;  Surgeon: Bergamo Gordy, MD;  Location: [facility] INVASIVE CV LAB;  Service: Cardiovascular;  Laterality: N/A;   LEFT HEART CATH AND CORONARY ANGIOGRAPHY N/A 08/30/2019   Procedure: LEFT HEART CATH AND CORONARY ANGIOGRAPHY;  Surgeon: Bergamo Gordy, MD;  Location: [facility] INVASIVE CV LAB;  Service: Cardiovascular;  Laterality: N/A;   LEFT HEART CATH AND CORONARY ANGIOGRAPHY N/A 08/13/2023   Procedure: LEFT HEART CATH AND CORONARY ANGIOGRAPHY;  Surgeon: Bergamo Gordy, MD;  Location:  [facility] INVASIVE CV LAB;  Service: Cardiovascular;  Laterality: N/A;   LEFT HEART CATHETERIZATION WITH CORONARY ANGIOGRAM N/A 07/13/2012   Procedure: LEFT HEART CATHETERIZATION WITH CORONARY ANGIOGRAM;  Surgeon: Erick JONELLE Bergamo, MD;  Location: [facility] CATH LAB;  Service: Cardiovascular;  Laterality: N/A;   LEFT HEART CATHETERIZATION WITH CORONARY ANGIOGRAM N/A 07/11/2014   Procedure: LEFT HEART CATHETERIZATION WITH CORONARY ANGIOGRAM;  Surgeon: Erick JONELLE Bergamo, MD;  Location: [facility] CATH LAB;  Service: Cardiovascular;  Laterality: N/A;   RETINAL  DETACHMENT SURGERY Left 2010   RETINAL LASER PROCEDURE Right 2010   THYROIDECTOMY, PARTIAL  ~ 1979   TONSILLECTOMY AND ADENOIDECTOMY  1977       Home Medications    Prior to Admission medications  Medication Sig Start Date End Date Taking? Authorizing Provider  acetaminophen  (TYLENOL ) 325 MG tablet Take 650 mg by mouth every 6 (six) hours as needed for moderate pain or headache.    [provider]  albuterol  (VENTOLIN  HFA) 108 (90 Base) MCG/ACT inhaler Inhale 1-2 puffs into the lungs every 6 (six) hours as needed. 12/10/23   Corlis Burnard DEL, NP  amoxicillin -clavulanate (AUGMENTIN ) 875-125 MG tablet Take 1 tablet by mouth every 12 (twelve) hours. 04/11/24   Shaquisha Wynn, Shelba JONELLE, NP  aspirin  81 MG tablet Take 1 tablet (81 mg total) by mouth daily. 06/15/15   Bergamo Heinz, MD  cetirizine  (ZYRTEC  ALLERGY) 10 MG tablet Take 1 tablet (10 mg total) by mouth daily. 04/11/24   Darthula Desa, Shelba JONELLE, NP  Evolocumab  (REPATHA  SURECLICK) 140 MG/ML SOAJ Inject 140 mg into the skin every 14 (fourteen) days. 06/12/23   Bergamo Heinz, MD  guaiFENesin (MUCINEX) 600 MG 12 hr tablet Take 600 mg by mouth 2 (two) times daily as needed (congestion).    [provider]  insulin  aspart (NOVOLOG ) 100 UNIT/ML injection Inject into the skin See admin instructions. Uses via Insulin  Pump    [provider]  losartan  (COZAAR ) 25 MG tablet TAKE ONE HALF (1/2) TABLET BY MOUTH ONCE A DAY 10/05/23   Bergamo Heinz, MD  Melatonin 5 MG CAPS Take 5 mg by mouth at bedtime.    [provider]  metoprolol  tartrate (LOPRESSOR ) 25 MG tablet TAKE ONE TABLET BY MOUTH TWICE (2) DAILY 07/30/23   Bergamo Heinz, MD  nitroGLYCERIN  (NITROSTAT ) 0.4 MG SL tablet Place 1 tablet (0.4 mg total) under the tongue every 5 (five) minutes as needed for chest pain. 06/12/22   Patwardhan, Newman PARAS, MD  Polyethyl Glycol-Propyl Glycol (SYSTANE OP) Place 1 drop into both eyes daily as needed (dry eyes).    [provider]    Family  History Family History  Problem Relation Age of Onset   Stroke Mother 67   Atrial fibrillation Mother    Atrial fibrillation Father    Heart disease Sister    Pancreatic cancer Paternal Uncle    Mesothelioma Paternal Uncle     Social History Social History[1]   Allergies   Brilinta  [ticagrelor ] and Statins   Review of Systems Review of Systems  Constitutional: Negative.   HENT:  Positive for congestion, sinus pressure and sinus pain. Negative for dental problem, drooling, ear discharge, ear pain, facial swelling, hearing loss, mouth sores, nosebleeds, postnasal drip, rhinorrhea, sneezing, sore throat, tinnitus, trouble swallowing and voice change.   Respiratory:  Positive for cough. Negative for apnea, choking, chest tightness, shortness of breath, wheezing and stridor.   Gastrointestinal: Negative.   Neurological:  Positive for headaches. Negative for dizziness, tremors, seizures, syncope, facial  asymmetry, speech difficulty, weakness, light-headedness and numbness.     Physical Exam Triage Vital Signs ED Triage Vitals  Encounter Vitals Group     BP 05/27/24 0941 (!) 142/74     Girls Systolic BP Percentile --      Girls Diastolic BP Percentile --      Boys Systolic BP Percentile --      Boys Diastolic BP Percentile --      Pulse Rate 05/27/24 0941 81     Resp 05/27/24 0941 20     Temp 05/27/24 0941 98.1 F (36.7 C)     Temp Source 05/27/24 0941 Oral     SpO2 05/27/24 0941 97 %     Weight --      Height --      Head Circumference --      Peak Flow --      Pain Score 05/27/24 0944 3     Pain Loc --      Pain Education --      Exclude from Growth Chart --    No data found.  Updated Vital Signs BP (!) 142/74 (BP Location: Left Arm)   Pulse 81   Temp 98.1 F (36.7 C) (Oral)   Resp 20   SpO2 97%   Visual Acuity Right Eye Distance:   Left Eye Distance:   Bilateral Distance:    Right Eye Near:   Left Eye Near:    Bilateral Near:     Physical  Exam Constitutional:      Appearance: Normal appearance.  HENT:     Right Ear: Tympanic membrane, ear canal and external ear normal.     Left Ear: Tympanic membrane, ear canal and external ear normal.     Nose: Congestion present.     Left Sinus: Maxillary sinus tenderness present.  Cardiovascular:     Rate and Rhythm: Normal rate and regular rhythm.     Pulses: Normal pulses.     Heart sounds: Normal heart sounds.  Pulmonary:     Effort: Pulmonary effort is normal.     Breath sounds: Normal breath sounds.  Neurological:     Mental Status: He is alert and oriented to person, place, and time.      UC Treatments / Results  Labs (all labs ordered are listed, but only abnormal results are displayed) Labs Reviewed - No data to display  EKG   Radiology No results found.  Procedures Procedures (including critical care time)  Medications Ordered in UC Medications - No data to display  Initial Impression / Assessment and Plan / UC Course  I have reviewed the triage vital signs and the nursing notes.  Pertinent labs & imaging results that were available during my care of the patient were reviewed by me and considered in my medical decision making (see chart for details).  Acute nonrecurrent maxillary sinusitis  Patient is in no signs of distress nor toxic appearing.  Vital signs are stable.  Low suspicion for pneumonia, pneumothorax or bronchitis and therefore will defer imaging.  Symptoms persisting for 2 weeks, consistent with a sinusitis, history of reoccurrence.  Prescribed Augmentin .May use additional over-the-counter medications as needed for supportive care.  May follow-up with urgent care as needed if symptoms persist or worsen.  Has had 3 sinus infections since August 2025, walker referral given to ear nose and throat.   Final Clinical Impressions(s) / UC Diagnoses   Final diagnoses:  None   Discharge Instructions   None  ED Prescriptions   None    PDMP  not reviewed this encounter.     [1]  Social History Tobacco Use   Smoking status: Never   Smokeless tobacco: Never  Vaping Use   Vaping status: Never Used  Substance Use Topics   Alcohol use: Not Currently    Comment: 1-2 beers, a few days a week or less   Drug use: No     Teresa Shelba SAUNDERS, NP 05/27/24 0959  "

## 2024-05-27 NOTE — Discharge Instructions (Signed)
 Begin Augmentin twice a day for 7 days   You can take Tylenol and/or Ibuprofen as needed for fever reduction and pain relief.   For cough: honey 1/2 to 1 teaspoon (you can dilute the honey in water or another fluid).  You can also use guaifenesin and dextromethorphan for cough. You can use a humidifier for chest congestion and cough.  If you don't have a humidifier, you can sit in the bathroom with the hot shower running.      For sore throat: try warm salt water gargles, cepacol lozenges, throat spray, warm tea or water with lemon/honey, popsicles or ice, or OTC cold relief medicine for throat discomfort.   For congestion: take a daily anti-histamine like Zyrtec, Claritin, and a oral decongestant, such as pseudoephedrine.  You can also use Flonase 1-2 sprays in each nostril daily.   It is important to stay hydrated: drink plenty of fluids (water, gatorade/powerade/pedialyte, juices, or teas) to keep your throat moisturized and help further relieve irritation/discomfort.

## 2024-05-27 NOTE — ED Triage Notes (Signed)
 Patient reports headache, jaw pain and nasal congestion with green mucus x 2 weeks. Patient has taken Zyrtec  and Ibuprofen  with mild relief. Rates headache 3/10 and left jaw pain 2/10

## 2024-06-22 ENCOUNTER — Other Ambulatory Visit: Payer: Self-pay | Admitting: Cardiology

## 2024-06-22 DIAGNOSIS — I25118 Atherosclerotic heart disease of native coronary artery with other forms of angina pectoris: Secondary | ICD-10-CM

## 2024-06-22 DIAGNOSIS — E7800 Pure hypercholesterolemia, unspecified: Secondary | ICD-10-CM

## 2024-07-04 ENCOUNTER — Other Ambulatory Visit (HOSPITAL_COMMUNITY): Payer: Self-pay

## 2024-07-04 ENCOUNTER — Telehealth: Payer: Self-pay | Admitting: Pharmacy Technician

## 2024-07-04 ENCOUNTER — Encounter: Payer: Self-pay | Admitting: Cardiology

## 2024-07-04 NOTE — Telephone Encounter (Signed)
 Pharmacy Patient Advocate Encounter   Received notification from Physician's Office that prior authorization for repatha  is required/requested.   Insurance verification completed.   The patient is insured through [place].   Per test claim: PA required; PA submitted to above mentioned insurance via Latent Key/confirmation #/EOC [id number] Status is pending

## 2024-07-04 NOTE — Telephone Encounter (Signed)
 Pharmacy Patient Advocate Encounter  Received notification from [employer] that Prior Authorization for Repatha  has been APPROVED from 06/09/24 to 06/08/25   PA #/Case ID/Reference #: [id number]

## 2024-07-07 ENCOUNTER — Encounter: Payer: Self-pay | Admitting: Physician Assistant

## 2024-07-07 ENCOUNTER — Ambulatory Visit (INDEPENDENT_AMBULATORY_CARE_PROVIDER_SITE_OTHER): Admitting: Physician Assistant

## 2024-07-07 VITALS — BP 119/72 | HR 86 | Temp 98.0°F | Resp 16 | Ht 70.0 in | Wt 219.0 lb

## 2024-07-07 DIAGNOSIS — E103593 Type 1 diabetes mellitus with proliferative diabetic retinopathy without macular edema, bilateral: Secondary | ICD-10-CM | POA: Diagnosis not present

## 2024-07-07 DIAGNOSIS — E108 Type 1 diabetes mellitus with unspecified complications: Secondary | ICD-10-CM | POA: Diagnosis not present

## 2024-07-07 DIAGNOSIS — I2510 Atherosclerotic heart disease of native coronary artery without angina pectoris: Secondary | ICD-10-CM | POA: Diagnosis not present

## 2024-07-07 DIAGNOSIS — Z7689 Persons encountering health services in other specified circumstances: Secondary | ICD-10-CM

## 2024-07-07 DIAGNOSIS — Z8546 Personal history of malignant neoplasm of prostate: Secondary | ICD-10-CM

## 2024-07-07 NOTE — Progress Notes (Signed)
 [facility] [address] [place], [place] [address]  Internal MEDICINE  Office Visit Note  Patient Name: Travis Fletcher  [id number]  [id number]  Date of Service: 07/07/2024   Complaints/HPI Pt is here for establishment of PCP. Chief Complaint  Patient presents with   New Patient (Initial Visit)   Hyperlipidemia   Diabetes   HPI Pt is here to establish care -patient reports he is followed by several specialists who manage his chronic conditions, but is in need of a PCP for annual wellness visits/acute concerns when necessary. He reports he had last CPE in Aug -Dr. Faythe, endocrinology manages type 1 DM; does report he had 1/2 thyroid  removed due to nodule as a teen, but was not malignant. Was on synthroid for a few years and then was able to come off it. Goes back in March, has A1c, foot exam and urine Acr (02/26/24) done with their office in addition to other routine blood work -Dr. Ladona, Cardiology, for CAD and has had several caths and stents. States he never had a MI, but has endothelial dysfunction. -Dr Maree, ophthalmology at [facility], sees them tomorrow for an injection. He has diabetic retinopathy. Had a bleed in right eye recently. Has had this before and has been told before nothing to be done until it clears some.  -Urology, Dr. Renda, Hx of prostate Ca in 2010. Does yearly visits. Did seed implants and PSA has been undetectable since then -Colonoscopy in 04/2023, 1 polyp, thinks 5 years ([facility])--no results available -hx of slight asthma; did have long covid and hx of bronchitis a few times. Did have 3 sinus infections last year and was told at one point he may need ENT. He would rather hold for now since feeling well, but would like to consider referral in future if occurring again.  Current Medication: Outpatient Encounter Medications as of 07/07/2024  Medication Sig Note   acetaminophen  (TYLENOL ) 325 MG tablet Take 650 mg by mouth every 6 (six)  hours as needed for moderate pain or headache.    albuterol  (VENTOLIN  HFA) 108 (90 Base) MCG/ACT inhaler Inhale 1-2 puffs into the lungs every 6 (six) hours as needed.    aspirin  81 MG tablet Take 1 tablet (81 mg total) by mouth daily.    cetirizine  (ZYRTEC  ALLERGY) 10 MG tablet Take 1 tablet (10 mg total) by mouth daily.    Evolocumab  (REPATHA  SURECLICK) 140 MG/ML SOAJ INJECT 140 MG INTO THE SKIN EVERY 14 DAYS.    insulin  aspart (NOVOLOG ) 100 UNIT/ML injection Inject into the skin See admin instructions. Uses via Insulin  Pump 07/09/2021: Pt doesn't know the basal rate or bolus   losartan  (COZAAR ) 25 MG tablet TAKE ONE HALF (1/2) TABLET BY MOUTH ONCE A DAY    metoprolol  tartrate (LOPRESSOR ) 25 MG tablet TAKE ONE TABLET BY MOUTH TWICE (2) DAILY    nitroGLYCERIN  (NITROSTAT ) 0.4 MG SL tablet Place 1 tablet (0.4 mg total) under the tongue every 5 (five) minutes as needed for chest pain.    Polyethyl Glycol-Propyl Glycol (SYSTANE OP) Place 1 drop into both eyes daily as needed (dry eyes).    [DISCONTINUED] amoxicillin -clavulanate (AUGMENTIN ) 875-125 MG tablet Take 1 tablet by mouth every 12 (twelve) hours.    [DISCONTINUED] guaiFENesin (MUCINEX) 600 MG 12 hr tablet Take 600 mg by mouth 2 (two) times daily as needed (congestion).    [DISCONTINUED] Melatonin 5 MG CAPS Take 5 mg by mouth at bedtime.    No facility-administered encounter medications on  file as of 07/07/2024.    Surgical History: Past Surgical History:  Procedure Laterality Date   BALLOON ANGIOPLASTY, ARTERY  07/13/2012   CARDIAC CATHETERIZATION N/A 06/15/2015   Procedure: Left Heart Cath and Coronary Angiography;  Surgeon: Gordy Bergamo, MD;  Location: [facility] INVASIVE CV LAB;  Service: Cardiovascular;  Laterality: N/A;   CARDIAC CATHETERIZATION N/A 06/15/2015   Procedure: Coronary Stent Intervention;  Surgeon: Gordy Bergamo, MD;  Location: [facility] INVASIVE CV LAB;  Service: Cardiovascular;  Laterality: N/A;   CATARACT EXTRACTION W/ INTRAOCULAR LENS   IMPLANT, BILATERAL Bilateral ~ 2000   CERVICAL DISC ARTHROPLASTY N/A 07/17/2021   Procedure: CERVICAL ANTERIOR DISC ARTHROPLASTY C5-6;  Surgeon: Burnetta Aures, MD;  Location: [facility] OR;  Service: Orthopedics;  Laterality: N/A;  3 hrs 3 C-Bed   CORONARY ANGIOPLASTY  07/11/2014   CORONARY ANGIOPLASTY WITH STENT PLACEMENT  2012   1   EYE SURGERY     INGUINAL HERNIA REPAIR Left 1987   INSERTION PROSTATE RADIATION SEED  2010   LEFT HEART CATH AND CORONARY ANGIOGRAPHY N/A 06/26/2017   Procedure: LEFT HEART CATH AND CORONARY ANGIOGRAPHY;  Surgeon: Bergamo Gordy, MD;  Location: [facility] INVASIVE CV LAB;  Service: Cardiovascular;  Laterality: N/A;   LEFT HEART CATH AND CORONARY ANGIOGRAPHY N/A 08/30/2019   Procedure: LEFT HEART CATH AND CORONARY ANGIOGRAPHY;  Surgeon: Bergamo Gordy, MD;  Location: [facility] INVASIVE CV LAB;  Service: Cardiovascular;  Laterality: N/A;   LEFT HEART CATH AND CORONARY ANGIOGRAPHY N/A 08/13/2023   Procedure: LEFT HEART CATH AND CORONARY ANGIOGRAPHY;  Surgeon: Bergamo Gordy, MD;  Location: [facility] INVASIVE CV LAB;  Service: Cardiovascular;  Laterality: N/A;   LEFT HEART CATHETERIZATION WITH CORONARY ANGIOGRAM N/A 07/13/2012   Procedure: LEFT HEART CATHETERIZATION WITH CORONARY ANGIOGRAM;  Surgeon: Erick JONELLE Bergamo, MD;  Location: [facility] CATH LAB;  Service: Cardiovascular;  Laterality: N/A;   LEFT HEART CATHETERIZATION WITH CORONARY ANGIOGRAM N/A 07/11/2014   Procedure: LEFT HEART CATHETERIZATION WITH CORONARY ANGIOGRAM;  Surgeon: Erick JONELLE Bergamo, MD;  Location: [facility] CATH LAB;  Service: Cardiovascular;  Laterality: N/A;   RETINAL DETACHMENT SURGERY Left 2010   RETINAL LASER PROCEDURE Right 2010   THYROIDECTOMY, PARTIAL  ~ 1979   TONSILLECTOMY AND ADENOIDECTOMY  1977    Medical History: Past Medical History:  Diagnosis Date   Anginal pain    Arthritis    hands (07/11/2014)   Asthma    mild   CAD (coronary artery disease), native coronary artery 07/24/2018   Coronary artery disease    H/O pneumothorax     spontaneous, when patient was 69 years old   High cholesterol    History of COVID-19 06/2019   x 2; 02/2021   History of kidney stones    Hypercholesteremia 07/24/2018   Kidney stones    passed them all   Neuromuscular disorder (HCC)    neuropathy   Prostate cancer (HCC) 2010   Statin intolerance 07/24/2018   Type 1 diabetes mellitus with other specified complication (HCC)     Family History: Family History  Problem Relation Age of Onset   Stroke Mother 54   Atrial fibrillation Mother    Atrial fibrillation Father    Heart disease Sister    Pancreatic cancer Paternal Uncle    Mesothelioma Paternal Uncle     Social History   Socioeconomic History   Marital status: Married    Spouse name: Kate   Number of children: 2   Years of education: high school   Highest education level: Not on  file  Occupational History   Not on file  Tobacco Use   Smoking status: Never   Smokeless tobacco: Never  Vaping Use   Vaping status: Never Used  Substance and Sexual Activity   Alcohol use: Not Currently    Comment: 1-2 beers, a few days a week or less   Drug use: No   Sexual activity: Yes    Birth control/protection: Post-menopausal  Other Topics Concern   Not on file  Social History Narrative   03/22/20   From: the area   Living: with wife, Kate (1980)    Work: retire - from holiday representative, on disability      Family: 2 children - Arts Administrator and Visual Merchandiser - no grandchildren      Enjoys: play music, hunt      Exercise: cardiac rehab, treadmill - getting back into it after back injury   Diet: tries to do diabetic and heart healthy diet      Safety   Seat belts: Yes    Guns: declines to answer   Safe in relationships: Yes    Social Drivers of Health   Tobacco Use: Low Risk (07/08/2024)   Received from [facility]   Patient History    Smoking Tobacco Use: Never    Smokeless Tobacco Use: Never    Passive Exposure: Not on file  Financial Resource Strain: Not on file  Food  Insecurity: No Food Insecurity (02/07/2022)   Hunger Vital Sign    Worried About Running Out of Food in the Last Year: Never true    Ran Out of Food in the Last Year: Never true  Transportation Needs: No Transportation Needs (02/07/2022)   PRAPARE - Administrator, Civil Service (Medical): No    Lack of Transportation (Non-Medical): No  Physical Activity: Not on file  Stress: Not on file  Social Connections: Unknown (10/22/2021)   Received from [facility]   Social Network    Social Network: Not on file  Intimate Partner Violence: Unknown (09/13/2021)   Received from [facility]   HITS    Physically Hurt: Not on file    Insult or Talk Down To: Not on file    Threaten Physical Harm: Not on file    Scream or Curse: Not on file  Depression (PHQ2-9): Low Risk (07/07/2024)   Depression (PHQ2-9)    PHQ-2 Score: 0  Alcohol Screen: Low Risk (07/07/2024)   Alcohol Screen    Last Alcohol Screening Score (AUDIT): 2  Housing: Not on file  Utilities: Not on file  Health Literacy: Not on file     Review of Systems  Constitutional:  Negative for chills, fatigue and unexpected weight change.  HENT:  Negative for congestion, rhinorrhea, sneezing and sore throat.   Respiratory:  Negative for cough, chest tightness and shortness of breath.   Cardiovascular:  Negative for chest pain and palpitations.  Gastrointestinal:  Negative for abdominal pain, constipation, diarrhea, nausea and vomiting.  Genitourinary:  Negative for dysuria and frequency.  Musculoskeletal:  Negative for arthralgias, back pain, joint swelling and neck pain.  Skin:  Negative for rash.  Neurological: Negative.  Negative for tremors and numbness.  Hematological:  Negative for adenopathy. Does not bruise/bleed easily.  Psychiatric/Behavioral:  Negative for behavioral problems (Depression) and suicidal ideas. The patient is not nervous/anxious.     Vital Signs: BP 119/72   Pulse 86   Temp 98 F (36.7 C)   Resp  16   Ht 5' 10 (1.778  m)   Wt 219 lb (99.3 kg)   SpO2 92%   BMI 31.42 kg/m    Physical Exam Vitals and nursing note reviewed.  Constitutional:      General: He is not in acute distress.    Appearance: He is well-developed. He is not diaphoretic.  HENT:     Head: Normocephalic and atraumatic.  Eyes:     Extraocular Movements: Extraocular movements intact.  Neck:     Thyroid : No thyromegaly.     Vascular: No JVD.     Trachea: No tracheal deviation.  Cardiovascular:     Rate and Rhythm: Normal rate and regular rhythm.     Heart sounds: Normal heart sounds. No murmur heard.    No friction rub. No gallop.  Pulmonary:     Effort: Pulmonary effort is normal. No respiratory distress.     Breath sounds: No wheezing or rales.  Chest:     Chest wall: No tenderness.  Skin:    General: Skin is warm and dry.  Neurological:     Mental Status: He is alert and oriented to person, place, and time.     Gait: Gait normal.  Psychiatric:        Behavior: Behavior normal.        Thought Content: Thought content normal.        Judgment: Judgment normal.       Assessment/Plan: 1. Type 1 diabetes mellitus with complications (HCC) (Primary) Followed by endocrinology with routine labs.  2. Proliferative diabetic retinopathy of both eyes without macular edema associated with type 1 diabetes mellitus (HCC) Followed by ophthalmology and reports he will be starting injections tomorrow  3. Coronary artery disease involving native coronary artery of native heart without angina pectoris Followed by cardiology, unable to take statins, and they are prescribing repatha   4. History of prostate cancer Hx of seed implants and undetectable PSA, follows with urology annually for monitoring  5. Encounter to establish care with new provider Reviewed health hx and will schedule CPE when due. Has labs done already by endocrinology facility    General Counseling: Missy oakland understanding of the  findings of todays visit and agrees with plan of treatment. I have discussed any further diagnostic evaluation that may be needed or ordered today. We also reviewed his medications today. he has been encouraged to call the office with any questions or concerns that should arise related to todays visit.    Counseling:    No orders of the defined types were placed in this encounter.   No orders of the defined types were placed in this encounter.    This patient was seen by Tinnie Pro, PA-C in collaboration with Dr. Sigrid Bathe as a part of collaborative care agreement.   Time spent:35 Minutes

## 2024-07-11 ENCOUNTER — Ambulatory Visit: Payer: Self-pay | Admitting: Physician Assistant

## 2025-01-26 ENCOUNTER — Ambulatory Visit: Admitting: Physician Assistant
